# Patient Record
Sex: Female | Born: 1950
Health system: Southern US, Community
[De-identification: ages and names within clinical notes are randomized; demographics above are authoritative.]

## PROBLEM LIST (undated history)

## (undated) DIAGNOSIS — K219 Gastro-esophageal reflux disease without esophagitis: Principal | ICD-10-CM

## (undated) DIAGNOSIS — J302 Other seasonal allergic rhinitis: Secondary | ICD-10-CM

## (undated) DIAGNOSIS — F329 Major depressive disorder, single episode, unspecified: Secondary | ICD-10-CM

## (undated) DIAGNOSIS — E785 Hyperlipidemia, unspecified: Secondary | ICD-10-CM

## (undated) DIAGNOSIS — K5790 Diverticulosis of intestine, part unspecified, without perforation or abscess without bleeding: Secondary | ICD-10-CM

## (undated) DIAGNOSIS — N8110 Cystocele, unspecified: Secondary | ICD-10-CM

## (undated) DIAGNOSIS — G4700 Insomnia, unspecified: Secondary | ICD-10-CM

## (undated) DIAGNOSIS — R002 Palpitations: Secondary | ICD-10-CM

## (undated) DIAGNOSIS — Z872 Personal history of diseases of the skin and subcutaneous tissue: Secondary | ICD-10-CM

## (undated) DIAGNOSIS — R7301 Impaired fasting glucose: Secondary | ICD-10-CM

## (undated) DIAGNOSIS — E038 Other specified hypothyroidism: Secondary | ICD-10-CM

## (undated) DIAGNOSIS — Z8619 Personal history of other infectious and parasitic diseases: Secondary | ICD-10-CM

## (undated) DIAGNOSIS — E663 Overweight: Secondary | ICD-10-CM

## (undated) DIAGNOSIS — Z86010 Personal history of colonic polyps: Secondary | ICD-10-CM

## (undated) DIAGNOSIS — F419 Anxiety disorder, unspecified: Secondary | ICD-10-CM

## (undated) DIAGNOSIS — H269 Unspecified cataract: Secondary | ICD-10-CM

## (undated) DIAGNOSIS — E039 Hypothyroidism, unspecified: Secondary | ICD-10-CM

## (undated) DIAGNOSIS — H8110 Benign paroxysmal vertigo, unspecified ear: Secondary | ICD-10-CM

## (undated) HISTORY — DX: Benign paroxysmal vertigo, unspecified ear: H81.10

## (undated) HISTORY — DX: Hyperlipidemia, unspecified: E78.5

## (undated) HISTORY — DX: Unspecified cataract: H26.9

## (undated) HISTORY — DX: Major depressive disorder, single episode, unspecified: F32.9

## (undated) HISTORY — DX: Cystocele, unspecified: N81.10

## (undated) HISTORY — DX: Gastro-esophageal reflux disease without esophagitis: K21.9

## (undated) HISTORY — DX: Impaired fasting glucose: R73.01

## (undated) HISTORY — PX: COLONOSCOPY: SHX174

## (undated) HISTORY — PX: OTHER SURGICAL HISTORY: SHX169

## (undated) HISTORY — DX: Anxiety disorder, unspecified: F41.9

## (undated) HISTORY — DX: Personal history of diseases of the skin and subcutaneous tissue: Z87.2

## (undated) HISTORY — PX: ESOPHAGOGASTRODUODENOSCOPY: SHX1529

## (undated) HISTORY — DX: Other seasonal allergic rhinitis: J30.2

## (undated) HISTORY — DX: Diverticulosis of intestine, part unspecified, without perforation or abscess without bleeding: K57.90

## (undated) HISTORY — DX: Palpitations: R00.2

## (undated) HISTORY — DX: Other specified hypothyroidism: E03.8

## (undated) HISTORY — DX: Personal history of colonic polyps: Z86.010

## (undated) HISTORY — DX: Overweight: E66.3

## (undated) HISTORY — PX: COSMETIC SURGERY: SHX468

## (undated) HISTORY — DX: Hypothyroidism, unspecified: E03.9

## (undated) HISTORY — DX: Personal history of other infectious and parasitic diseases: Z86.19

## (undated) HISTORY — DX: Insomnia, unspecified: G47.00

---

## 1980-12-22 HISTORY — PX: TONSILLECTOMY: SHX5217

## 1981-12-22 HISTORY — PX: TUBAL LIGATION: SHX77

## 2000-06-18 ENCOUNTER — Encounter: Payer: Self-pay | Admitting: *Deleted

## 2000-06-18 ENCOUNTER — Encounter: Admission: RE | Admit: 2000-06-18 | Discharge: 2000-06-18 | Payer: Self-pay | Admitting: *Deleted

## 2000-06-22 ENCOUNTER — Other Ambulatory Visit: Admission: RE | Admit: 2000-06-22 | Discharge: 2000-06-22 | Payer: Self-pay | Admitting: Obstetrics and Gynecology

## 2001-07-01 ENCOUNTER — Encounter: Admission: RE | Admit: 2001-07-01 | Discharge: 2001-07-01 | Payer: Self-pay | Admitting: *Deleted

## 2001-07-01 ENCOUNTER — Encounter: Payer: Self-pay | Admitting: *Deleted

## 2001-07-27 ENCOUNTER — Other Ambulatory Visit: Admission: RE | Admit: 2001-07-27 | Discharge: 2001-07-27 | Payer: Self-pay | Admitting: Obstetrics and Gynecology

## 2002-07-05 ENCOUNTER — Encounter: Payer: Self-pay | Admitting: *Deleted

## 2002-07-05 ENCOUNTER — Encounter: Admission: RE | Admit: 2002-07-05 | Discharge: 2002-07-05 | Payer: Self-pay | Admitting: *Deleted

## 2003-08-07 ENCOUNTER — Encounter: Payer: Self-pay | Admitting: Surgery

## 2003-08-07 ENCOUNTER — Encounter: Admission: RE | Admit: 2003-08-07 | Discharge: 2003-08-07 | Payer: Self-pay | Admitting: Surgery

## 2004-12-09 ENCOUNTER — Encounter: Admission: RE | Admit: 2004-12-09 | Discharge: 2004-12-09 | Payer: Self-pay | Admitting: General Surgery

## 2005-04-08 ENCOUNTER — Ambulatory Visit: Payer: Self-pay | Admitting: Family Medicine

## 2005-05-16 ENCOUNTER — Ambulatory Visit: Payer: Self-pay | Admitting: Family Medicine

## 2005-09-03 ENCOUNTER — Ambulatory Visit: Payer: Self-pay | Admitting: Family Medicine

## 2005-12-22 DIAGNOSIS — Z8619 Personal history of other infectious and parasitic diseases: Secondary | ICD-10-CM

## 2005-12-22 HISTORY — DX: Personal history of other infectious and parasitic diseases: Z86.19

## 2005-12-31 ENCOUNTER — Ambulatory Visit: Payer: Self-pay | Admitting: Family Medicine

## 2006-03-06 ENCOUNTER — Encounter: Admission: RE | Admit: 2006-03-06 | Discharge: 2006-03-06 | Payer: Self-pay | Admitting: Surgery

## 2006-06-30 ENCOUNTER — Ambulatory Visit: Payer: Self-pay | Admitting: Family Medicine

## 2006-07-22 ENCOUNTER — Ambulatory Visit: Payer: Self-pay | Admitting: Gastroenterology

## 2006-07-28 ENCOUNTER — Encounter (INDEPENDENT_AMBULATORY_CARE_PROVIDER_SITE_OTHER): Payer: Self-pay | Admitting: *Deleted

## 2006-07-28 ENCOUNTER — Ambulatory Visit (HOSPITAL_COMMUNITY): Admission: RE | Admit: 2006-07-28 | Discharge: 2006-07-28 | Payer: Self-pay | Admitting: Gastroenterology

## 2006-07-31 ENCOUNTER — Ambulatory Visit: Payer: Self-pay | Admitting: Gastroenterology

## 2006-08-26 ENCOUNTER — Ambulatory Visit: Payer: Self-pay | Admitting: Gastroenterology

## 2006-11-03 ENCOUNTER — Ambulatory Visit: Payer: Self-pay | Admitting: Family Medicine

## 2007-07-15 ENCOUNTER — Encounter: Admission: RE | Admit: 2007-07-15 | Discharge: 2007-07-15 | Payer: Self-pay | Admitting: Obstetrics and Gynecology

## 2008-07-26 ENCOUNTER — Encounter: Admission: RE | Admit: 2008-07-26 | Discharge: 2008-07-26 | Payer: Self-pay | Admitting: Obstetrics and Gynecology

## 2010-03-28 ENCOUNTER — Encounter: Admission: RE | Admit: 2010-03-28 | Discharge: 2010-03-28 | Payer: Self-pay | Admitting: Obstetrics and Gynecology

## 2010-12-22 HISTORY — PX: POSTERIOR REPAIR: SHX2254

## 2011-03-20 ENCOUNTER — Other Ambulatory Visit: Payer: Self-pay | Admitting: Obstetrics and Gynecology

## 2011-03-20 DIAGNOSIS — Z1231 Encounter for screening mammogram for malignant neoplasm of breast: Secondary | ICD-10-CM

## 2011-04-17 ENCOUNTER — Ambulatory Visit
Admission: RE | Admit: 2011-04-17 | Discharge: 2011-04-17 | Disposition: A | Discharge status: Home / Self Care | Payer: PRIVATE HEALTH INSURANCE | Source: Ambulatory Visit | Attending: Obstetrics and Gynecology | Admitting: Obstetrics and Gynecology

## 2011-04-17 DIAGNOSIS — Z1231 Encounter for screening mammogram for malignant neoplasm of breast: Secondary | ICD-10-CM

## 2011-05-09 NOTE — Assessment & Plan Note (Signed)
[facility]                           GASTROENTEROLOGY OFFICE NOTE   NAME:Barbara Munoz, Barbara Munoz                       MRN:          [id number]  DATE:07/22/2006                            DOB:          29-Mar-1951    Patient is referred by Dr. Joette Catching.   REASON FOR REFERRAL:  Dr. Lysbeth Galas asked me to evaluate Barbara Munoz in  consultation regarding acute diarrhea, abdominal pains.   HISTORY OF PRESENT ILLNESS:  Barbara Munoz is a very pleasant 60 year old who  has had watery, non-bloody diarrhea for the past four weeks.  She has never  had troubles like this before.  She says that the symptoms started July 5.  At its worst she would move her bowels 10-15 times a day, sometimes at  night.  They were mucusy and watery.  She has had nocturnal symptoms.  She  has had no sick contacts, no travel.  She did have abdominal discomfort.  They were almost always relieved when she would move her bowels.  She has  had some mild abdominal discomforts and these seem to be localizing in the  right lower quadrant on more of a chronic basis in the past week or so.  She  has had fevers up to 101 two to three times in the past month.  She was  evaluated by her primary care physician with a urinalysis and this she tells  me was negative.  She was given 10 days of Flagyl and her symptoms did seem  to improve towards the end of that regimen but then when she stopped her  diarrhea returned.  For the past week or so she has definitely noticed an  improvement, moving her bowels only 4-5 times a day.  Along this same time  she has noticed gradually worsening right lower quadrant discomfort.  This  discomfort has not been limiting her.  She has tried Lomotil without much  effect.   REVIEW OF SYSTEMS:  Normal for an 8 pound weight loss in the past month.  No  rashes, lesions on her skin.  No eye symptoms. The rest of her review of  systems is essentially negative and is available  on her nursing in take  sheet.   PAST MEDICAL HISTORY:  1.  Elevated cholesterol.  2.  Anxiety.  3.  Panic disorders since 1985.  4.  Breast surgery - several cysts were aspirated.  5.  Chronic headaches.  6.  Tubal ligation in 1982.   CURRENT MEDICATIONS:  1.  Lipitor.  2.  Sertraline.  3.  Aspirin.  4.  Dicyclomine.   ALLERGIES:  No known drug allergies.   SOCIAL HISTORY:  Married with two daughters.  Works as a Runner, broadcasting/film/video.  Non-  smoker, non-drinker.   FAMILY HISTORY:  Daughter with diabetes.  Sister with breast cancer.  Father  with prostate cancer.  No colon cancer or colon polyps in family.   PHYSICAL EXAMINATION:  VITAL SIGNS:  5 feet 3 inches.  Weight 132 pounds.  Blood pressure 110/60.  Pulse 60.  CONSTITUTIONAL:  Generally well-appearing neurologically alert and  oriented  x3.  HEENT:  Eyes, extraocular movements intact.  Mouth, oropharynx moist.  No  lesions.  NECK:  Supple.  No lymphadenopathy.  CARDIOVASCULAR:  Regular rate and rhythm.  LUNGS:  Clear to auscultation bilaterally.  ABDOMEN:  Soft, mildly tender in the right lower quadrant.  Non-distended.  No peritoneal signs.  No obvious ascites.  EXTREMITIES:  No lower extremity edema.  SKIN:  No rash or lesion on visible extremities.   ASSESSMENT/PLAN:  A 60 year old woman with four weeks of watery, non-bloody  diarrhea, right lower quadrant discomfort.   This seems a bit long for an acute infection, although possibly Giardia  could cause something like this.  That should have been well treated with  Flagyl.  Perhaps it has seeded but she has not been on antibiotics other  than the Flagyl for the past 6-8 months.  I am also concerned that this may  be a new diagnosis of inflammatory bowel disease, specifically Crohn's  Disease.  She does have some discomfort in her right lower quadrant which  may indicate terminal ileal involvement.  I will arrange for her to have  blood tests done today for CBC, complete  metabolic profile, PDG for sprue  and thyroid studies.  She will also have stool testing done for C. diff, ova  and parasites, Giardia, routine stool cultures and stool white count.  Lastly, I will arrange for her to have a colonoscopy done at her soonest  convenience.  I will pay particular attention to her terminal ileum at that  point.                                   Rachael Fee, MD   DPJ/MedQ  DD:  07/22/2006  DT:  07/22/2006  Job #:  [id number]   cc:   Delaney Meigs, MD

## 2011-05-09 NOTE — Assessment & Plan Note (Signed)
[facility]                           GASTROENTEROLOGY OFFICE NOTE   NAME:Banos, Barbara Munoz                       MRN:          [id number]  DATE:08/26/2006                            DOB:          03/24/1951    PRIMARY CARE PHYSICIAN:  Barbara Munoz, M.D.   GI PROBLEM LIST:  Presumed C. difficile colitis.  Began July 2007 shortly  after clindamycin treatment for dental work in June.  C. difficile toxin  negative.  Colonoscopy August 2007 essentially normal except for mild  mucosal abnormality distally.  Biopsies showed minimally active acute  colitis.  The patient responded to 10-day course of Flagyl 250 mg p.o.  t.i.d.  Initially going 10-15 times a day plus nocturnal symptoms.  This  improved dramatically.   INTERVAL HISTORY:  I last saw Barbara Munoz at the time of her colonoscopy  approximately 4 weeks ago.  Since then, she has completed a 10-day course of  Flagyl and has dramatically improved.  She does still have mild fatigue and  loose stools up to 2-3 times a day.  Sometimes she does not move her bowels  at all.   CURRENT MEDICINES:  Lipitor, sertraline, aspirin, dicyclomine.   PHYSICAL EXAM:  134 pounds, blood pressure 108/70, pulse 60.  CONSTITUTIONAL:  Generally well-appearing.  NEUROLOGIC:  Alert and oriented x3.  ABDOMEN:  Soft, nontender, nondistended.  Normal bowel sounds.   ASSESSMENT AND PLAN:  A 60 year old woman with presumed resolved Clostridium  difficile colitis.   I think her current fatigue and mildly loose stools are post-infectious in  nature.  I think we will simply observe these for now.  She knows to call if  she has any return of more dramatic diarrhea.  At that point, I would  restart her on a 14-day course of Flagyl probably at a high dose and see how  she does.                                   Barbara Fee, MD   DPJ/MedQ  DD:  08/26/2006  DT:  08/26/2006  Job #:  [id number]   cc:   Barbara Munoz,  M.D.

## 2011-05-23 HISTORY — PX: OTHER SURGICAL HISTORY: SHX169

## 2011-05-26 ENCOUNTER — Ambulatory Visit (HOSPITAL_COMMUNITY)
Admission: AD | Admit: 2011-05-26 | Discharge: 2011-05-27 | Disposition: A | Discharge status: Home / Self Care | Payer: BC Managed Care – PPO | Source: Ambulatory Visit | Attending: Obstetrics and Gynecology | Admitting: Obstetrics and Gynecology

## 2011-05-26 DIAGNOSIS — N816 Rectocele: Secondary | ICD-10-CM | POA: Insufficient documentation

## 2011-05-26 LAB — CBC
Hemoglobin: 14.1 g/dL (ref 12.0–15.0)
MCHC: 33.8 g/dL (ref 30.0–36.0)
WBC: 7.3 10*3/uL (ref 4.0–10.5)

## 2011-05-27 LAB — CBC
Hemoglobin: 12.1 g/dL (ref 12.0–15.0)
MCV: 93.6 fL (ref 78.0–100.0)
Platelets: 179 10*3/uL (ref 150–400)
RBC: 3.92 MIL/uL (ref 3.87–5.11)
WBC: 12.3 10*3/uL — ABNORMAL HIGH (ref 4.0–10.5)

## 2011-05-29 NOTE — H&P (Signed)
Munoz, Barbara                ACCOUNT NO.:  [id number]  MEDICAL RECORD NO.:  [id number]           PATIENT TYPE:  O  LOCATION:  [facility]                           [facility]  PHYSICIAN:  Dois Davenport A. Shanna Strength, M.D. DATE OF BIRTH:  03/25/1951  DATE OF ADMISSION:  05/14/2011 DATE OF DISCHARGE:                             HISTORY & PHYSICAL   HISTORY OF PRESENT ILLNESS:  Barbara Munoz is a 60 year old married white female para 2-0-1-2 presenting for posterior colporrhaphy because of a symptomatic rectocele.  Barbara Munoz was diagnosed as having a rectocele approximately 3 years ago and since that time has required the application of pressure to her perineum in order to have a bowel movement.  She now has decided that her symptoms have become quite disruptive and desires to proceed with surgical correction.  She goes on to say that occasionally retain of gas in her lower bowel areas that will cause a fluttering sensation in her abdomen and vaginal discomfort.  She denies any dysuria, dyspareunia, incomplete bladder emptying or constipation. She does admit however, to occasional urinary urgency and leaking of urine whenever she sneezes. A pelvic ultrasound done May 2012 showed a uterus measuring 5.49 x 3.83 x 2.71 cm with normal-appearing ovaries and no free fluid or pelvic masses identified.  As previously stated, the patient has decided to proceed with surgical repair of her rectocele though medical options were offered to her.  PAST MEDICAL HISTORY/OB HISTORY:  Gravida 3, para 2-0-1-2.  The patient had 2 spontaneous vaginal births: 1978 -5 pounds 8 ounces and  1981-8 pounds 5 ounces.  GYN HISTORY:  Menarche 60 years old.  The patient has been menopausal since 2004. Gives history of a single episode of post-menopausal bleeding   in April 2012 however, the work-up was negative. Denies any history of  sexually transmitted diseases or abnormal Pap smears.  The patient's  last normal  mammogram and Pap smear was April 2012   MEDICAL HISTORY:  Migraines, gastroesophageal reflux disease, depression, and increased cholesterol.  SURGICAL HISTORY:  1979 D&C because of a  miscarriage, 1982 tonsillectomy and adenoidectomy, 1983 bilateral tubal ligation.  The patient denies any problems with anesthesia and denies any history of blood transfusions.  FAMILY HISTORY:  Thyroid disease, lung cancer, breast cancer (sister at age 34 and maternal grandmother in her 52s), diabetes, hypertension, pulmonary embolism (paternal grandmother in her 47s).  HABITS:  The patient rarely consumes alcohol.  Denies any tobacco or illicit drug use.  SOCIAL HISTORY:  The patient is married and she is a retired Runner, broadcasting/film/video.  CURRENT MEDICATIONS: 1. Simvastatin 40 mg daily. 2. Sertraline 100 mg daily. 3. Aspirin 81 mg daily. 4. Multivitamin 1 daily. 5. Calcium with vitamin D twice daily. 6. Pepcid AC as needed.  ALLERGIES:  She denies any known drug allergies and further denies any sensitivities to soy, shellfish, latex, or peanuts.  REVIEW OF SYSTEMS:  The patient does wear glasses.  She does have chronic ear congestion.  Admits to frequent sinus infections, has occasional urinary urgency, occasional stress urinary incontinence symptoms, occasional leg cramps, atypical chest pain (negative cardiac  workup).  She denies any shortness of breath, nausea, vomiting, diarrhea, arthralgias, myalgias, skin rashes, dysphasia, unilateral weakness, numbness, and except as is mentioned in history present illness the patient's review of systems is otherwise negative.  PHYSICAL EXAMINATION:  VITAL SIGNS:  Blood pressure is 110/78, respiration 14, pulse is 72, temperature is 98.3 degrees Fahrenheit orally, weight 159 pounds, height 5 feet 4-1/4  inches, body mass index is 27.1. NECK:  Supple without masses.  There is no thyromegaly or cervical adenopathy. HEART:  Regular rate and rhythm. LUNGS:   Clear. BACK:  No CVA tenderness. ABDOMEN:  No tenderness, guarding, rebound, masses, or organomegaly. EXTREMITIES:  No clubbing, cyanosis, or edema. PELVIC:  EGBUS is mildly atrophic.  Vagina is atrophic with mild pelvic relaxation.  There is a 2/4 rectocele noted.  Cervix is nontender without lesions.  Uterus appears normal size, shape, and consistency. Adnexa:  No tenderness or masses.  Rectovaginal:  No masses or tenderness.  IMPRESSION:  Symptomatic rectocele.  DISPOSITION:  A discussion was held with the patient regarding indications for her procedure along with its risks which include but are not limited to reaction to anesthesia, damage to adjacent organs, infection, and excessive bleeding.  The patient verbalized understanding of these risks and has consented to proceed with posterior colporrhaphy on May 26, 2011 at 9:30 a.m. at [facility].     Elmira J. Lowell Guitar, P.A.-C   ______________________________ Crist Fat Yardley Beltran, M.D.    EJP/MEDQ  D:  05/23/2011  T:  05/24/2011  Job:  [id number]  Electronically Signed by Raylene Everts. on 05/25/2011 11:06:42 PM Electronically Signed by Silverio Lay M.D. on 05/29/2011 11:26:27 PM

## 2011-05-29 NOTE — Op Note (Signed)
  Barbara Munoz, Barbara Munoz                ACCOUNT NO.:  [id number]  MEDICAL RECORD NO.:  [id number]  LOCATION:  9320                          FACILITY:  [facility]  PHYSICIAN:  Crist Fat. Mirelle Biskup, M.D. DATE OF BIRTH:  03/06/1951  DATE OF PROCEDURE:  05/26/2011 DATE OF DISCHARGE:                              OPERATIVE REPORT   PREOPERATIVE DIAGNOSIS:  Symptomatic rectocele.  POSTOPERATIVE DIAGNOSIS:  Symptomatic rectocele.  ANESTHESIA:  Spinal, Dr. Cristela Blue.  PROCEDURE:  Posterior repair with cure of rectocele.  SURGEON:  Crist Fat. Graig Hessling, MD  ASSISTANT:  Elmira J. Lowell Guitar, physician's assistant.  ESTIMATED BLOOD LOSS:  100 mL.  PROCEDURE IN DETAIL:  After being informed of the planned procedure with possible complications including bleeding, infection, injury to other organs, and recurrence of rectocele, informed consent was obtained.  The patient was taken to OR #3 and given spinal anesthesia without complication.  She was placed in the lithotomy position, prepped and draped in a sterile fashion, and her bladder was emptied with a Foley catheter.  After assessing adequate level of anesthesia, we grabbed the posterior fourchette with Allis forceps and then infiltrated the posterior vaginal wall using lidocaine 1% epinephrine 1:200,000 all the way until the tip of the rectocele.  We then could remove a small section of the fourchette with knife, which gave [place] access to the posterior vaginal wall.  This wall was undermined medially with Strully scissors and opened until the tip of the rectocele.  We can now sharply and bluntly move the prerectal fascia away from the vaginal wall until the rectocele was completely corrected.  We then plicate the prerectal fascia in multiple U sutures of 2-0 Vicryl in three different planes. The rectocele was then completely corrected.  Excess vaginal mucosa was excised and we closed the posterior vaginal mucosa with a running lock suture of 3-0 Vicryl.   A 1-inch estradiol cream packed.  Packing was placed in the vagina.  Instrument and sponge count was complete x2.  Estimated blood loss was 100 mL.  The procedure was well tolerated by the patient who was taken to recovery room in a well and stable condition.  There was no specimen.     Crist Fat Etty Isaac, M.D.     SAR/MEDQ  D:  05/26/2011  T:  05/27/2011  Job:  [id number]  Electronically Signed by Silverio Lay M.D. on 05/29/2011 11:26:32 PM

## 2011-09-05 ENCOUNTER — Ambulatory Visit (INDEPENDENT_AMBULATORY_CARE_PROVIDER_SITE_OTHER): Payer: BC Managed Care – PPO | Admitting: Family Medicine

## 2011-09-05 ENCOUNTER — Encounter: Payer: Self-pay | Admitting: Family Medicine

## 2011-09-05 DIAGNOSIS — F32A Depression, unspecified: Secondary | ICD-10-CM

## 2011-09-05 DIAGNOSIS — Z8619 Personal history of other infectious and parasitic diseases: Secondary | ICD-10-CM

## 2011-09-05 DIAGNOSIS — K219 Gastro-esophageal reflux disease without esophagitis: Secondary | ICD-10-CM

## 2011-09-05 DIAGNOSIS — R3915 Urgency of urination: Secondary | ICD-10-CM

## 2011-09-05 DIAGNOSIS — Z0000 Encounter for general adult medical examination without abnormal findings: Secondary | ICD-10-CM

## 2011-09-05 DIAGNOSIS — T7840XA Allergy, unspecified, initial encounter: Secondary | ICD-10-CM

## 2011-09-05 DIAGNOSIS — N8110 Cystocele, unspecified: Secondary | ICD-10-CM

## 2011-09-05 DIAGNOSIS — N8111 Cystocele, midline: Secondary | ICD-10-CM

## 2011-09-05 DIAGNOSIS — E663 Overweight: Secondary | ICD-10-CM | POA: Insufficient documentation

## 2011-09-05 DIAGNOSIS — Z6825 Body mass index (BMI) 25.0-25.9, adult: Secondary | ICD-10-CM

## 2011-09-05 DIAGNOSIS — E0590 Thyrotoxicosis, unspecified without thyrotoxic crisis or storm: Secondary | ICD-10-CM

## 2011-09-05 DIAGNOSIS — F329 Major depressive disorder, single episode, unspecified: Secondary | ICD-10-CM

## 2011-09-05 DIAGNOSIS — Z872 Personal history of diseases of the skin and subcutaneous tissue: Secondary | ICD-10-CM | POA: Insufficient documentation

## 2011-09-05 DIAGNOSIS — F341 Dysthymic disorder: Secondary | ICD-10-CM

## 2011-09-05 DIAGNOSIS — H8110 Benign paroxysmal vertigo, unspecified ear: Secondary | ICD-10-CM

## 2011-09-05 DIAGNOSIS — E785 Hyperlipidemia, unspecified: Secondary | ICD-10-CM | POA: Insufficient documentation

## 2011-09-05 DIAGNOSIS — Z23 Encounter for immunization: Secondary | ICD-10-CM

## 2011-09-05 DIAGNOSIS — G4700 Insomnia, unspecified: Secondary | ICD-10-CM

## 2011-09-05 DIAGNOSIS — G43909 Migraine, unspecified, not intractable, without status migrainosus: Secondary | ICD-10-CM | POA: Insufficient documentation

## 2011-09-05 HISTORY — DX: Insomnia, unspecified: G47.00

## 2011-09-05 HISTORY — DX: Overweight: E66.3

## 2011-09-05 HISTORY — DX: Gastro-esophageal reflux disease without esophagitis: K21.9

## 2011-09-05 LAB — HEPATIC FUNCTION PANEL
AST: 32 U/L (ref 0–37)
Alkaline Phosphatase: 58 U/L (ref 39–117)
Bilirubin, Direct: 0 mg/dL (ref 0.0–0.3)
Total Protein: 7.9 g/dL (ref 6.0–8.3)

## 2011-09-05 LAB — CBC
Platelets: 204 10*3/uL (ref 150–400)
RDW: 12.7 % (ref 11.5–15.5)
WBC: 6.1 10*3/uL (ref 4.0–10.5)

## 2011-09-05 LAB — LIPID PANEL
HDL: 47.2 mg/dL (ref >39.00)
Total CHOL/HDL Ratio: 4
VLDL: 28 mg/dL (ref 0.0–40.0)

## 2011-09-05 LAB — RENAL FUNCTION PANEL
Albumin: 4.6 g/dL (ref 3.5–5.2)
BUN: 14 mg/dL (ref 6–23)
CO2: 26 mEq/L (ref 19–32)
Chloride: 107 mEq/L (ref 96–112)

## 2011-09-05 LAB — TSH: TSH: 0.24 u[IU]/mL — ABNORMAL LOW (ref 0.35–5.50)

## 2011-09-05 MED ORDER — CYCLOBENZAPRINE HCL 5 MG PO TABS: 5.0000 mg | ORAL_TABLET | Freq: Every evening | ORAL | Status: AC | PRN

## 2011-09-05 NOTE — Patient Instructions (Addendum)
Preventative Care for Adults - Female Studies show that half of deaths in the [place] today result from unhealthy lifestyle practices. This includes ignoring preventive care suggestions. Preventive health guidelines for women include the following key practices:  A routine yearly physical is a good way to check with your primary caregiver about your health and preventive screening. It is a chance to share any concerns and updates on your health, and to receive a thorough all-over exam.   If you smoke cigarettes, find out from your caregiver how to quit. It can literally save your life, no matter how long you have been a tobacco user. If you do not use tobacco, never start.   Maintain a healthy diet and normal weight. Increased weight leads to problems with blood pressure and diabetes. Decrease saturated fat in your diet and increase regular exercise. Eat a variety of foods, including fruit, vegetables, animal or vegetable protein (meat, fish, chicken, and eggs, or beans, lentils, and tofu), and grains, such as rice. Get information about proper diet from your caregiver, if needed.   Aerobic exercise helps maintain good heart health. The CDC and the American College of Sports Medicine recommend 30 minutes of moderate-intensity exercise (a brisk walk that increases your heart rate and breathing) on most days of the week. Ongoing high blood pressure should be treated with medicines, if weight loss and exercise are not effective.   Avoid smoking, drinking too much alcohol (more than two drinks per day), and use of street drugs. Do not share needles with anyone. Ask for professional help if you need support or instructions about stopping the use of alcohol, cigarettes, or drugs.   Maintain normal blood lipids and cholesterol, by minimizing your intake of saturated fat. Eat a well rounded diet, with plenty of fruit and vegetables. The National Institutes of Health encourage women to eat 5-9 servings of  fruit and vegetables each day. Your caregiver can give instructions to help you keep your risk of heart disease or stroke low. High blood pressure causes heart disease and increases risk of stroke. Blood pressure should be checked every 1-2 years, from age 20 onward.   Blood tests for high cholesterol, which causes heart and vessel disease, should begin at age 20 and be repeated every 5 years, if test results are normal. (Repeat tests more often if results are high.)   Diabetes screening involves taking a blood sample to check your blood sugar level, after a fasting period. This is done once every 3 years, after age 45, if test results are normal.   Breast cancer screening is essential to preventive care for women. All women age 20 and older should perform a breast self-exam every month. At age 40 and older, women should have their caregiver complete a breast exam each year. Women at ages 40-50 should have a mammogram (x-ray film) of the breasts each year. Your caregiver can discuss when to start your yearly mammograms.   Cervical cancer screening includes taking a Pap smear (sample of cells examined under a microscope) from the cervix (end of the uterus). It also includes testing for HPV (Human Papilloma Virus, which can cause cervical cancer). Screening and a pelvic exam should begin at age 21, or 3 years after a woman becomes sexually active. Screening should occur every year, with a Pap smear but no HPV testing, up to age 30. After age 30, you should have a Pap smear every 3 years with HPV testing, if no HPV was found previously.     Colon cancer can be detected, and often prevented, long before it is life threatening. Most routine colon cancer screening begins at the age of 50. On a yearly basis, your caregiver may provide easy-to-use take-home tests to check for hidden blood in the stool. Use of a small camera at the end of a tube, to directly examine the colon (sigmoidoscopy or colonoscopy), can  detect the earliest forms of colon cancer and can be life saving. Talk to your caregiver about this at age 50, when routine screening begins. (Screening is repeated every 5 years, unless early forms of pre-cancerous polyps or small growths are found.)   Practice safe sex. Use condoms. Condoms are used for birth control and to reduce the spread of sexually transmitted infections (STIs). Unsafe sex is sexual activity without the use of safeguards, such as condoms and avoidance of high-risk acts, to reduce the chances of getting or spreading STIs. STIs include gonorrhea (the clap), chlamydia, syphilis, trichimonas, herpes, HPV (human papilloma virus) and HIV (human immunodeficiency virus), which causes AIDS. Herpes, HIV, and HPV are viral illnesses that have no cure. They can result in disability, cancer, and death.   HPV causes cancer of the cervix, and other infections that can be transmitted from person to person. There is a vaccine for HPV, and females should get immunized between the ages of 11 and 26. It requires a series of 3 shots.   Osteoporosis is a disease in which the bones lose minerals and strength as we age. This can result in serious bone fractures. Risk of osteoporosis can be identified using a bone density scan. Women ages 65 and over should discuss this with their caregivers, as should women after menopause who have other risk factors. Ask your caregiver whether you should be taking a calcium supplement and Vitamin D, to reduce the rate of osteoporosis.   Menopause can be associated with physical symptoms and risks. Hormone replacement therapy is available to decrease these. You should talk to your caregiver about whether starting or continuing to take hormones is right for you.   Use sunscreen with SPF (skin protection factor) of 15 or more. Apply sunscreen liberally and repeatedly throughout the day. Being outside in the sun, when your shadow is shorter than you are, means you are being  exposed to sun at greater intensity. Lighter skinned people are at a greater risk of skin cancer. Wear sunglasses, to protect your eyes from too much damaging sunlight (which can speed up cataract formation).   Once a month, do a whole body skin exam or review, using a mirror to look at your back. Notify your caregiver of changes in moles, especially if there are changes in shapes, colors, irregular border, a size larger than a pencil eraser, or new moles develop.   Keep carbon monoxide and smoke detectors in your home, and functioning, at all times. Change the batteries every 6 months, or use a model that plugs into the wall.   Stay up to date with your tetanus shots and other required immunizations. A booster for tetanus should be given every 10 years. Be sure to get your flu shot every year, since 5%-20% of the [place] population comes down with the flu. The composition of the flu vaccine changes each year, so being vaccinated once is not enough. Get your shot in the fall, before the flu season peaks. The table below lists important vaccines to get. Other vaccines to consider include for Hepatitis A virus (to prevent a form of   infection of the liver, by a virus acquired from food), Varicella Zoster (a virus that causes shingles), and Meninogoccal (against bacteria which cause a form of meningitis).   Brush your teeth twice a day with fluoride toothpaste, and floss once a day. Good oral hygiene prevents tooth decay and gum disease, which can be painful and can cause other health problems. Visit your dentist for a routine oral and dental check up and preventive care every 6-12 months.   The Body Mass Index or BMI is a way of measuring how much of your body is fat. Having a BMI above 27 increases the risk of heart disease, diabetes, hypertension, stroke and other problems related to obesity. Your caregiver can help determine your BMI, and can develop an exercise and dietary program to help you achieve or  maintain this measurement at a healthy level.   Wear seat belts whenever you are in a vehicle, whether as passenger or driver, and even for short drives of a few minutes.   If you bicycle, wear a helmet at all times.  Preventative Care for Adult Women  Preventative Services Ages 87-39 Ages 31-64 Ages 50 and over  Health risk assessment and lifestyle counseling.     Blood pressure check.** Every 1-2 years Every 1-2 years Every 1-2 years  Total cholesterol check including HDL.** Every 5 years beginning at age 90 Every 5 years beginning at age 25, or more often if risk is high Every 5 years through age 67, then optional  Breast self exam. Monthly in all women ages 53 and older Monthly Monthly  Clinical breast exam.** Every 3 years beginning at age 82 Every year Every year  Mammogram.**  Every year beginning at age 60, optional from age 88-49 (discuss with your caregiver). Every year until age 18, then optional  Pap Smear** and HPV Screening. Every year from ages 38 through 46 Every 3 years from ages 80 through 68, if HPV is negative Optional; talk with your caregiver  Flexible sigmoidoscopy** or colonoscopy.**   Every 5 years beginning at age 10 Every 5 years until age 52; then optional  FOBT (fecal occult blood test) of stool.  Every year beginning at age 34 Every year until 5; then optional  Skin self-exam. Monthly Monthly Monthly  Tetanus-diphtheria (Td) immunization. Every 10 years Every 10 years Every 10 years  Influenza immunization.** Every year Every year Every year  HPV immunization. Once between the ages of 53 and 34     Pneumococcal immunization.** Optional Optional Every 5 years  Hepatitis B immunization.** Series of 3 immunizations  (if not done previously, usually given at 0, 1 to 2, and 4 to 6 months)  Check with your caregiver, if vaccination not previously given Check with your caregiver, if vaccination not previously given  ** Family history and personal history of risk and  conditions may change your caregiver's recommendations.  Document Released: 02/03/2002 Document Re-Released: 03/04/2010 [facility] Patient Information 2011 [place], [place].   Start MegRed by [employer] daily  Eat a yogurt or take a probiotic daily

## 2011-09-06 LAB — T4, FREE: Free T4: 1.03 ng/dL (ref 0.80–1.80)

## 2011-09-08 ENCOUNTER — Encounter: Payer: Self-pay | Admitting: Family Medicine

## 2011-09-08 DIAGNOSIS — Z8619 Personal history of other infectious and parasitic diseases: Secondary | ICD-10-CM | POA: Insufficient documentation

## 2011-09-08 DIAGNOSIS — F32A Depression, unspecified: Secondary | ICD-10-CM

## 2011-09-08 DIAGNOSIS — H8110 Benign paroxysmal vertigo, unspecified ear: Secondary | ICD-10-CM

## 2011-09-08 DIAGNOSIS — N8110 Cystocele, unspecified: Secondary | ICD-10-CM | POA: Insufficient documentation

## 2011-09-08 DIAGNOSIS — F419 Anxiety disorder, unspecified: Secondary | ICD-10-CM

## 2011-09-08 DIAGNOSIS — T7840XA Allergy, unspecified, initial encounter: Secondary | ICD-10-CM | POA: Insufficient documentation

## 2011-09-08 HISTORY — DX: Benign paroxysmal vertigo, unspecified ear: H81.10

## 2011-09-08 HISTORY — DX: Depression, unspecified: F32.A

## 2011-09-08 HISTORY — DX: Anxiety disorder, unspecified: F41.9

## 2011-09-08 NOTE — Assessment & Plan Note (Signed)
Tolerating Simvastatin, would have her start Megared ffish oil caps daily and avoid trans fats, continue to monitor

## 2011-09-08 NOTE — Assessment & Plan Note (Signed)
Adequate control on Famotidine, encouraged avoidance of offending foods and report any worsening symptoms

## 2011-09-08 NOTE — Assessment & Plan Note (Signed)
No symptoms recently, encouraged good hydration and uses Sudafed infrequently with good results continue the same as needed

## 2011-09-08 NOTE — Assessment & Plan Note (Signed)
No significant symptoms at present encouraged her to take a regular dose of antihistamine when symptoms develop

## 2011-09-08 NOTE — Assessment & Plan Note (Signed)
Is already on Sertraline 100mg  po daily and Alprazolam prn and feels she is doing well, no changes to this regimen at this time

## 2011-09-08 NOTE — Assessment & Plan Note (Signed)
No recent symptoms, infection was several years ago

## 2011-09-08 NOTE — Assessment & Plan Note (Signed)
Mild urinary urgency and previously told she had a slight prolapse, she is encouraged to try Kegel exercises bid and we will monitor.

## 2011-09-08 NOTE — Assessment & Plan Note (Signed)
May use Benadryl or Alprazolam prn and good sleep hygiene

## 2011-09-08 NOTE — Progress Notes (Signed)
Barbara Munoz [id number] 01/02/1951 09/08/2011      Progress Note New Patient  Subjective  Chief Complaint  Chief Complaint  Patient presents with  . Establish Care    new patient  . Annual Exam    physical    HPI  Is a 60 year old is in today for new patient appointment. She is in need of establishing care. She has been having trouble intermittently with low back pain over the last month. No radicular symptoms or incontinence. No trauma or falls. Heart trouble in the past. Has not tried any meds for this. She has a history of allergies and does note some mild nasal congestion, postnasal drip and popping in her ears occurs at times and has been present intermittently over the last month. She gets occasional episodes of vertigo but has not had any recently. Uses Sudafed with good results. In June of this year she did have a rectocele repaired. They did not do a bladder tachycardia she was noted to have only very slight prolapse and they did not feel she would benefit. She does continue to have some urinary urgency but denies dysuria, fevers, chills, abdominal or back pain. Reports several years back now having some difficult with chest pain he did undergo EKG, stress testing, echocardiogram at that time which she reports was unremarkable. She does seek [facility] for her gynecologic care. She has a long history of panic and anxiety as well as depression. She feels she is well maintained on sertraline and is not having any panic attacks which she thinks were previously responsible for her atypical CP.  Past Medical History  Diagnosis Date  . Chicken pox as a child  . Measles as a child  . Mumps as a child  . Anxiety   . Depression   . Hyperlipidemia   . GERD (gastroesophageal reflux disease) 09/05/2011  . Overweight (BMI 25.0-29.9) 09/05/2011  . Migraine     worst during menopause  . History of cyst of breast     multiple drained during menopause  . Insomnia 09/05/2011  .  History of Clostridium difficile infection   . Allergy     seasonal  . Female bladder prolapse   . Anxiety and depression 09/08/2011  . Vertigo, benign positional 09/08/2011    Past Surgical History  Procedure Date  . Tonsillectomy 82  . Tubal ligation 1983  . Vaginal hernia repair 6-12    rectocele repair  . Cosmetic surgery     Family History  Problem Relation Age of Onset  . Diabetes Mother 61    type 2  . Aneurysm Father     aortic  . Cancer Father     prostate and lung/ smoker  . Cancer Sister 37    breast  . Hyperlipidemia Sister   . Hypertension Sister   . Diabetes Sister     type 2  . Other Sister     degenerative disc disease  . Thyroid disease Sister   . Hyperlipidemia Brother   . Allergies Brother   . Diabetes Daughter     type 1  . Cancer Maternal Grandmother     breast  . Heart attack Maternal Grandfather   . Heart disease Paternal Grandmother     CHF  . Kidney disease Paternal Grandfather   . Stroke Paternal Grandfather   . Hypertension Sister   . Hyperlipidemia Sister   . Diabetes Sister     type 2  . Allergies Daughter  History   Social History  . Marital Status: Married    Spouse Name: N/A    Number of Children: N/A  . Years of Education: N/A   Occupational History  . Not on file.   Social History Main Topics  . Smoking status: Former Smoker    Quit date: 12/22/1968  . Smokeless tobacco: Never Used   Comment: smoked for about 6 months  . Alcohol Use: No     rarely  . Drug Use: No  . Sexually Active: Not on file   Other Topics Concern  . Not on file   Social History Narrative  . No narrative on file    No current outpatient prescriptions on file prior to visit.    No Known Allergies  Review of Systems  Review of Systems  Constitutional: Negative for fever, chills and malaise/fatigue.  HENT: Positive for congestion. Negative for hearing loss and nosebleeds.   Eyes: Negative for discharge.  Respiratory: Negative  for cough, sputum production, shortness of breath and wheezing.   Cardiovascular: Negative for chest pain, palpitations and leg swelling.  Gastrointestinal: Negative for heartburn, nausea, vomiting, abdominal pain, diarrhea, constipation and blood in stool.  Genitourinary: Negative for dysuria, urgency, frequency and hematuria.  Musculoskeletal: Negative for myalgias, back pain and falls.  Skin: Negative for rash.       Has had a skin bx in past with Dr Loma Boston, bx neg for cancer, no skin concerns today  Neurological: Negative for dizziness, tremors, sensory change, focal weakness, loss of consciousness, weakness and headaches.  Endo/Heme/Allergies: Negative for polydipsia. Does not bruise/bleed easily.  Psychiatric/Behavioral: Negative for depression and suicidal ideas. The patient is not nervous/anxious and does not have insomnia.     Objective  BP 125/80  Pulse 78  Temp(Src) 97.7 F (36.5 C) (Oral)  Ht 5' 3.5" (1.613 m)  Wt 157 lb 6.4 oz (71.396 kg)  BMI 27.44 kg/m2  SpO2 97%  Physical Exam  Physical Exam  Constitutional: She is oriented to person, place, and time and well-developed, well-nourished, and in no distress. No distress.  HENT:  Head: Normocephalic and atraumatic.  Right Ear: External ear normal.  Left Ear: External ear normal.  Nose: Nose normal.  Mouth/Throat: Oropharynx is clear and moist. No oropharyngeal exudate.  Eyes: Conjunctivae are normal. Pupils are equal, round, and reactive to light. Right eye exhibits no discharge. Left eye exhibits no discharge. No scleral icterus.  Neck: Normal range of motion. Neck supple. No thyromegaly present.  Cardiovascular: Normal rate, regular rhythm, normal heart sounds and intact distal pulses.   No murmur heard. Pulmonary/Chest: Effort normal and breath sounds normal. No respiratory distress. She has no wheezes. She has no rales.  Abdominal: Soft. Bowel sounds are normal. She exhibits no distension and no mass. There is no  tenderness.  Musculoskeletal: Normal range of motion. She exhibits no edema and no tenderness.  Lymphadenopathy:    She has no cervical adenopathy.  Neurological: She is alert and oriented to person, place, and time. She has normal reflexes. No cranial nerve deficit. Coordination normal.  Skin: Skin is warm and dry. No rash noted. She is not diaphoretic.  Psychiatric: Mood, memory and affect normal.       Assessment & Plan  History of Clostridium difficile infection No recent symptoms, infection was several years ago   Allergic state No significant symptoms at present encouraged her to take a regular dose of antihistamine when symptoms develop  Female bladder prolapse Mild urinary urgency and  previously told she had a slight prolapse, she is encouraged to try Kegel exercises bid and we will monitor.  Anxiety and depression Is already on Sertraline 100mg  po daily and Alprazolam prn and feels she is doing well, no changes to this regimen at this time  GERD (gastroesophageal reflux disease) Adequate control on Famotidine, encouraged avoidance of offending foods and report any worsening symptoms  Hyperlipidemia Tolerating Simvastatin, would have her start Megared ffish oil caps daily and avoid trans fats, continue to monitor  Vertigo, benign positional No symptoms recently, encouraged good hydration and uses Sudafed infrequently with good results continue the same as needed  Insomnia May use Benadryl or Alprazolam prn and good sleep hygiene

## 2011-10-06 ENCOUNTER — Encounter: Payer: Self-pay | Admitting: Family Medicine

## 2011-10-06 ENCOUNTER — Ambulatory Visit (INDEPENDENT_AMBULATORY_CARE_PROVIDER_SITE_OTHER): Payer: BC Managed Care – PPO | Admitting: Family Medicine

## 2011-10-06 DIAGNOSIS — T7840XA Allergy, unspecified, initial encounter: Secondary | ICD-10-CM

## 2011-10-06 DIAGNOSIS — Z23 Encounter for immunization: Secondary | ICD-10-CM

## 2011-10-06 DIAGNOSIS — F341 Dysthymic disorder: Secondary | ICD-10-CM

## 2011-10-06 DIAGNOSIS — K219 Gastro-esophageal reflux disease without esophagitis: Secondary | ICD-10-CM

## 2011-10-06 DIAGNOSIS — E663 Overweight: Secondary | ICD-10-CM

## 2011-10-06 DIAGNOSIS — F3289 Other specified depressive episodes: Secondary | ICD-10-CM

## 2011-10-06 DIAGNOSIS — E785 Hyperlipidemia, unspecified: Secondary | ICD-10-CM

## 2011-10-06 DIAGNOSIS — Z6825 Body mass index (BMI) 25.0-25.9, adult: Secondary | ICD-10-CM

## 2011-10-06 DIAGNOSIS — G4700 Insomnia, unspecified: Secondary | ICD-10-CM

## 2011-10-06 DIAGNOSIS — F32A Depression, unspecified: Secondary | ICD-10-CM

## 2011-10-06 DIAGNOSIS — E079 Disorder of thyroid, unspecified: Secondary | ICD-10-CM

## 2011-10-06 DIAGNOSIS — F329 Major depressive disorder, single episode, unspecified: Secondary | ICD-10-CM

## 2011-10-06 MED ORDER — ZOSTER VACCINE LIVE 19400 UNT/0.65ML ~~LOC~~ SOLR: 0.6500 mL | Freq: Once | SUBCUTANEOUS | Status: DC

## 2011-10-06 MED ORDER — SERTRALINE HCL 100 MG PO TABS: 100.0000 mg | ORAL_TABLET | Freq: Every day | ORAL | Status: DC

## 2011-10-06 MED ORDER — SIMVASTATIN 40 MG PO TABS: 40.0000 mg | ORAL_TABLET | Freq: Every day | ORAL | Status: DC

## 2011-10-06 NOTE — Assessment & Plan Note (Signed)
No significant symptoms at this time. Continue current meds when necessary avoid offending foods and continue with exercise and weight loss regimen.

## 2011-10-06 NOTE — Assessment & Plan Note (Signed)
No flares in symptoms at this time. May continue to use loratadine when necessary.

## 2011-10-06 NOTE — Assessment & Plan Note (Addendum)
Mild elevation in ldl cholesterol, avoid trans fats start a fiber supplement, such as Benefiber. Continue exercise. Given three-month supply a simvastatin with a refill to

## 2011-10-06 NOTE — Assessment & Plan Note (Signed)
Doing well on current dose of sertraline. Requesting a 90 day supply. Prescription given today.

## 2011-10-06 NOTE — Assessment & Plan Note (Signed)
Tried melatonin but did not have a full response. She is allowed up the possibility that she may use alprazolam 1/2-1 tab intermittently each bedtime as needed.

## 2011-10-06 NOTE — Assessment & Plan Note (Signed)
TSH mildly suppressed but free T4 normal, will repeat labs in next couple of months to monitor, patient asymptomatic

## 2011-10-06 NOTE — Patient Instructions (Signed)

## 2011-10-06 NOTE — Assessment & Plan Note (Signed)
Weight is stable despite a good new exercise regimen, encouraged to consider the DASH diet.

## 2011-10-06 NOTE — Progress Notes (Signed)
Barbara Munoz [id number] [birth year] 10/06/2011      Progress Note-Follow Up  Subjective  Chief Complaint  Chief Complaint  Patient presents with  . Follow-up    1 month follow up    HPI  Patient is in today for routine followup. Generally doing well. No recent illness, fevers, chills, chest pain, palpitations, shortness of breath, GI or joint complaints. She did have a short bout of some increased fatigue but that is resolved. She has been exercising almost daily for about the last month and is very pleased. Says her energy level is better and she feels somewhat better. Her heartburn and allergies generally well-controlled on current indications and with dietary changes. She tried an melatonin for insomnia but this was not helpful. She is pleased with her circulation however for her depression and anxiety and overall feels that will control. Is tolerating her simvastatin taking it routinely. Has checked with the pharmacy and would like to take a prescription for her Zostavax with her today she will have that filled in the pharmacy and get in there.  Past Medical History  Diagnosis Date  . Chicken pox as a child  . Measles as a child  . Mumps as a child  . Anxiety   . Depression   . Hyperlipidemia   . GERD (gastroesophageal reflux disease) 09/05/2011  . Overweight (BMI 25.0-29.9) 09/05/2011  . Migraine     worst during menopause  . History of cyst of breast     multiple drained during menopause  . Insomnia 09/05/2011  . History of Clostridium difficile infection   . Allergy     seasonal  . Female bladder prolapse   . Anxiety and depression 09/08/2011  . Vertigo, benign positional 09/08/2011  . Overweight 10/06/2011    Past Surgical History  Procedure Date  . Tonsillectomy 82  . Tubal ligation 1983  . Vaginal hernia repair 6-12    rectocele repair  . Cosmetic surgery     Family History  Problem Relation Age of Onset  . Diabetes Mother 4    type 2  . Aneurysm Father      aortic  . Cancer Father     prostate and lung/ smoker  . Cancer Sister 69    breast  . Hyperlipidemia Sister   . Hypertension Sister   . Diabetes Sister     type 2  . Other Sister     degenerative disc disease  . Thyroid disease Sister   . Hyperlipidemia Brother   . Allergies Brother   . Diabetes Daughter     type 1  . Cancer Maternal Grandmother     breast  . Heart attack Maternal Grandfather   . Heart disease Paternal Grandmother     CHF  . Kidney disease Paternal Grandfather   . Stroke Paternal Grandfather   . Hypertension Sister   . Hyperlipidemia Sister   . Diabetes Sister     type 2  . Allergies Daughter     History   Social History  . Marital Status: Married    Spouse Name: N/A    Number of Children: N/A  . Years of Education: N/A   Occupational History  . Not on file.   Social History Main Topics  . Smoking status: Former Smoker    Quit date: 12/22/1968  . Smokeless tobacco: Never Used   Comment: smoked for about 6 months  . Alcohol Use: No     rarely  . Drug Use: No  .  Sexually Active: Not on file   Other Topics Concern  . Not on file   Social History Narrative  . No narrative on file    Current Outpatient Prescriptions on File Prior to Visit  Medication Sig Dispense Refill  . ALPRAZolam (XANAX) 0.25 MG tablet Take 0.25 mg by mouth at bedtime as needed.        [name] Kitchen aspirin 81 MG tablet Take 81 mg by mouth daily.        . Calcium Carbonate-Vitamin D (CALCIUM 600+D) 600-400 MG-UNIT per tablet Take 1 tablet by mouth daily.        . Cranberry 500 MG CAPS Take 1 capsule by mouth daily.        . cyclobenzaprine (FLEXERIL) 5 MG tablet Take 1 tablet (5 mg total) by mouth at bedtime as needed for muscle spasms.  30 tablet  1  . docusate sodium (COLACE) 100 MG capsule Take 100 mg by mouth daily.        [name] Kitchen loratadine (CLARITIN) 10 MG tablet Take 10 mg by mouth daily as needed. Spring and fall       . Multiple Vitamin (MULTIVITAMIN) tablet Take 1 tablet  by mouth daily.        . famotidine (PEPCID) 20 MG tablet Take 20 mg by mouth daily.          No Known Allergies  Review of Systems  Review of Systems  Constitutional: Positive for malaise/fatigue. Negative for fever.  HENT: Negative for congestion.   Eyes: Negative for discharge.  Respiratory: Negative for shortness of breath.   Cardiovascular: Negative for chest pain, palpitations and leg swelling.  Gastrointestinal: Negative for nausea, abdominal pain and diarrhea.  Genitourinary: Negative for dysuria.  Musculoskeletal: Negative for falls.  Skin: Negative for rash.  Neurological: Negative for loss of consciousness and headaches.  Endo/Heme/Allergies: Negative for polydipsia.  Psychiatric/Behavioral: Negative for depression and suicidal ideas. The patient has insomnia. The patient is not nervous/anxious.     Objective  BP 117/78  Pulse 79  Temp(Src) 97.8 F (36.6 C) (Oral)  Ht 5' 3.5" (1.613 m)  Wt 157 lb 12.8 oz (71.578 kg)  BMI 27.51 kg/m2  SpO2 95%  Physical Exam  Physical Exam  Constitutional: She is oriented to person, place, and time and well-developed, well-nourished, and in no distress. No distress.  HENT:  Head: Normocephalic and atraumatic.  Eyes: Conjunctivae are normal.  Neck: Neck supple. No thyromegaly present.  Cardiovascular: Normal rate and regular rhythm.  Exam reveals gallop.   No murmur heard. Pulmonary/Chest: Effort normal and breath sounds normal. She has no wheezes.  Abdominal: She exhibits no distension and no mass.  Musculoskeletal: She exhibits no edema.  Lymphadenopathy:    She has no cervical adenopathy.  Neurological: She is alert and oriented to person, place, and time.  Skin: Skin is warm and dry. No rash noted. She is not diaphoretic.  Psychiatric: Memory, affect and judgment normal.    Lab Results  Component Value Date   TSH 0.24* 09/05/2011   Lab Results  Component Value Date   WBC 6.1 09/05/2011   HGB 14.2 09/05/2011    HCT 43.6 09/05/2011   MCV 92.2 09/05/2011   PLT 204 09/05/2011   Lab Results  Component Value Date   CREATININE 0.7 09/05/2011   BUN 14 09/05/2011   NA 142 09/05/2011   K 4.1 09/05/2011   CL 107 09/05/2011   CO2 26 09/05/2011   Lab Results  Component Value Date  ALT 29 09/05/2011   AST 32 09/05/2011   ALKPHOS 58 09/05/2011   BILITOT 0.5 09/05/2011   Lab Results  Component Value Date   CHOL 185 09/05/2011   Lab Results  Component Value Date   HDL 47.20 09/05/2011   Lab Results  Component Value Date   LDLCALC 110* 09/05/2011   Lab Results  Component Value Date   TRIG 140.0 09/05/2011   Lab Results  Component Value Date   CHOLHDL 4 09/05/2011     Assessment & Plan  Hyperlipidemia Mild elevation in ldl cholesterol, avoid trans fats start a fiber supplement, such as Benefiber. Continue exercise. Given three-month supply a simvastatin with a refill to  Overweight (BMI 25.0-29.9) Weight is stable despite a good new exercise regimen, encouraged to consider the DASH diet.   Anxiety and depression Doing well on current dose of sertraline. Requesting a 90 day supply. Prescription given today.  Insomnia Tried melatonin but did not have a full response. She is allowed up the possibility that she may use alprazolam 1/2-1 tab intermittently each bedtime as needed.  Allergic state No flares in symptoms at this time. May continue to use loratadine when necessary.  GERD (gastroesophageal reflux disease) No significant symptoms at this time. Continue current meds when necessary avoid offending foods and continue with exercise and weight loss regimen.  Thyroid dysfunction TSH mildly suppressed but free T4 normal, will repeat labs in next couple of months to monitor, patient asymptomatic

## 2011-10-23 ENCOUNTER — Ambulatory Visit (INDEPENDENT_AMBULATORY_CARE_PROVIDER_SITE_OTHER): Payer: BC Managed Care – PPO | Admitting: Family Medicine

## 2011-10-23 ENCOUNTER — Encounter: Payer: Self-pay | Admitting: Family Medicine

## 2011-10-23 DIAGNOSIS — J329 Chronic sinusitis, unspecified: Secondary | ICD-10-CM

## 2011-10-23 DIAGNOSIS — F341 Dysthymic disorder: Secondary | ICD-10-CM

## 2011-10-23 DIAGNOSIS — F419 Anxiety disorder, unspecified: Secondary | ICD-10-CM

## 2011-10-23 DIAGNOSIS — J019 Acute sinusitis, unspecified: Secondary | ICD-10-CM

## 2011-10-23 DIAGNOSIS — E079 Disorder of thyroid, unspecified: Secondary | ICD-10-CM

## 2011-10-23 DIAGNOSIS — G4700 Insomnia, unspecified: Secondary | ICD-10-CM

## 2011-10-23 DIAGNOSIS — F32A Depression, unspecified: Secondary | ICD-10-CM

## 2011-10-23 DIAGNOSIS — H8110 Benign paroxysmal vertigo, unspecified ear: Secondary | ICD-10-CM

## 2011-10-23 LAB — T4, FREE: Free T4: 0.79 ng/dL (ref 0.60–1.60)

## 2011-10-23 LAB — TSH: TSH: 0.91 u[IU]/mL (ref 0.35–5.50)

## 2011-10-23 MED ORDER — AMOXICILLIN-POT CLAVULANATE 875-125 MG PO TABS: 1.0000 | ORAL_TABLET | Freq: Two times a day (BID) | ORAL | Status: DC

## 2011-10-23 MED ORDER — GUAIFENESIN ER 600 MG PO TB12: 600.0000 mg | ORAL_TABLET | Freq: Two times a day (BID) | ORAL | Status: AC

## 2011-10-23 MED ORDER — AMOXICILLIN-POT CLAVULANATE 875-125 MG PO TABS: ORAL_TABLET | ORAL | Status: DC

## 2011-10-23 NOTE — Patient Instructions (Signed)
Sinusitis Sinuses are air pockets within the bones of your face. The growth of bacteria within a sinus leads to infection. The infection prevents the sinuses from draining. This infection is called sinusitis. SYMPTOMS  There will be different areas of pain depending on which sinuses have become infected.  The maxillary sinuses often produce pain beneath the eyes.   Frontal sinusitis may cause pain in the middle of the forehead and above the eyes.  Other problems (symptoms) include:  Toothaches.   Colored, pus-like (purulent) drainage from the nose.   Swelling, warmth, and tenderness over the sinus areas may be signs of infection.  TREATMENT  Sinusitis is most often determined by an exam.X-rays may be taken. If x-rays have been taken, make sure you obtain your results or find out how you are to obtain them. Your caregiver may give you medications (antibiotics). These are medications that will help kill the bacteria causing the infection. You may also be given a medication (decongestant) that helps to reduce sinus swelling.  HOME CARE INSTRUCTIONS   Only take over-the-counter or prescription medicines for pain, discomfort, or fever as directed by your caregiver.   Drink extra fluids. Fluids help thin the mucus so your sinuses can drain more easily.   Applying either moist heat or ice packs to the sinus areas may help relieve discomfort.   Use saline nasal sprays to help moisten your sinuses. The sprays can be found at your local drugstore.  SEEK IMMEDIATE MEDICAL CARE IF:  You have a fever.   You have increasing pain, severe headaches, or toothache.   You have nausea, vomiting, or drowsiness.   You develop unusual swelling around the face or trouble seeing.  MAKE SURE YOU:   Understand these instructions.   Will watch your condition.   Will get help right away if you are not doing well or get worse.  Document Released: 12/08/2005 Document Revised: 08/20/2011 Document Reviewed:  07/07/2007 [facility] Patient Information 2012 [place], [place].   Start a probiotic and a yogurt daily Increase clear liquids Take 1/2 a tab of Alprazolam 1 hour prior to bed for the next 3 days and if no improvement in sleep call next week and consider medications for restless leg syndrome such as Requip or Mirapex

## 2011-10-23 NOTE — Assessment & Plan Note (Signed)
Has had some recent episodes but no symptoms presently, can consider vestibular rehaab if episodes become more frequent or troublesome

## 2011-10-23 NOTE — Assessment & Plan Note (Signed)
Has not been sleeping well recently with acute illness is sleeping at strange times then unable to sleep qhs. Encouraged to take a 1/2 a Alprazolam 1 hour prior to bedtime to help reset her clock for the next 3 days and then to reset her clock. Then as needed. If she still feels restless consider meds for RLS

## 2011-10-23 NOTE — Assessment & Plan Note (Signed)
She has had some increased stressors lately, ie daughter's pregnancy but she does not feel she needs a change in baseline meds at this time

## 2011-10-23 NOTE — Assessment & Plan Note (Signed)
Patient with sinusitis, acute vs a bad tooth/gingivitis. Given a prescription for Augmentin 875 1 tab po bid to take for 28 days, start Mucinex bid and increase fluids, she has an appt with a dentist soon to discuss a bad root canal in that region as well. Call if symptoms do not improve

## 2011-10-23 NOTE — Assessment & Plan Note (Signed)
Depressed tsh with labs in September but normal free T4 and now with some tremulousness and restless symptoms most notably qhs. Will recheck labs

## 2011-10-23 NOTE — Progress Notes (Signed)
Barbara Munoz [id number] 1951/08/12 10/23/2011      Progress Note-Follow Up  Subjective  Chief Complaint  Chief Complaint  Patient presents with  . Fatigue    X 2 weeks  . Adenopathy    swollen glands X 2 weeks  . Otalgia    X 2 weeks off and on  . Insomnia    X 3 days    HPI  Is a 60 year old Caucasian female who is in today with multiple concerns. She reports feeling poorly for the last 2 weeks. She is complaining of increased fatigue, painful lymphadenopathy in the neck, sinus congestion and headaches. She has some gum irritation and is scheduled to see her dentist soon. She is a bad root canal in the right upper gumline which he thinks is flaring. She denies fevers chills. She is having some intermittent discomfort in her ear since her pressure as well. No obvious chest pain, palpitations or shortness of breath. She does have some mild epistaxis which has occurred over the last couple of days. Has some mild throat irritation but no pain. Postnasal drip and some nasal drainage is noted. Does occasionally have a sense of difficulty breathing deeply. No wheezing or significant coughing. She is noting difficulty sleeping. Also feeling overly tired. She's been sleeping. During the day and having troubles at night. She is acknowledging some increased stress but does not think the major issue for her. Did have a flare of vertigo recently but that has calmed down. She is noting a long history of restless leg symptoms at night more on the  right than the left today she is noting just feeling tremulous all over at night when she cuts and having trouble staying asleep here  Past Medical History  Diagnosis Date  . Chicken pox as a child  . Measles as a child  . Mumps as a child  . Anxiety   . Depression   . Hyperlipidemia   . GERD (gastroesophageal reflux disease) 09/05/2011  . Overweight (BMI 25.0-29.9) 09/05/2011  . Migraine     worst during menopause  . History of cyst of breast    multiple drained during menopause  . Insomnia 09/05/2011  . History of Clostridium difficile infection   . Allergy     seasonal  . Female bladder prolapse   . Anxiety and depression 09/08/2011  . Vertigo, benign positional 09/08/2011  . Overweight 10/06/2011    Past Surgical History  Procedure Date  . Tonsillectomy 82  . Tubal ligation 1983  . Vaginal hernia repair 6-12    rectocele repair  . Cosmetic surgery     Family History  Problem Relation Age of Onset  . Diabetes Mother 37    type 2  . Aneurysm Father     aortic  . Cancer Father     prostate and lung/ smoker  . Cancer Sister 69    breast  . Hyperlipidemia Sister   . Hypertension Sister   . Diabetes Sister     type 2  . Other Sister     degenerative disc disease  . Thyroid disease Sister   . Hyperlipidemia Brother   . Allergies Brother   . Diabetes Daughter     type 1  . Cancer Maternal Grandmother     breast  . Heart attack Maternal Grandfather   . Heart disease Paternal Grandmother     CHF  . Kidney disease Paternal Grandfather   . Stroke Paternal Grandfather   . Hypertension Sister   .  Hyperlipidemia Sister   . Diabetes Sister     type 2  . Allergies Daughter     History   Social History  . Marital Status: Married    Spouse Name: N/A    Number of Children: N/A  . Years of Education: N/A   Occupational History  . Not on file.   Social History Main Topics  . Smoking status: Former Smoker    Quit date: 12/22/1968  . Smokeless tobacco: Never Used   Comment: smoked for about 6 months  . Alcohol Use: No     rarely  . Drug Use: No  . Sexually Active: Not on file   Other Topics Concern  . Not on file   Social History Narrative  . No narrative on file    Current Outpatient Prescriptions on File Prior to Visit  Medication Sig Dispense Refill  . ALPRAZolam (XANAX) 0.25 MG tablet Take 0.25 mg by mouth at bedtime as needed.        [name] Kitchen aspirin 81 MG tablet Take 81 mg by mouth daily.          . Calcium Carbonate-Vitamin D (CALCIUM 600+D) 600-400 MG-UNIT per tablet Take 1 tablet by mouth daily.        . Cranberry 500 MG CAPS Take 1 capsule by mouth daily.        . cyclobenzaprine (FLEXERIL) 5 MG tablet Take 1 tablet (5 mg total) by mouth at bedtime as needed for muscle spasms.  30 tablet  1  . docusate sodium (COLACE) 100 MG capsule Take 100 mg by mouth daily.        . famotidine (PEPCID) 20 MG tablet Take 20 mg by mouth daily.        [name] Kitchen loratadine (CLARITIN) 10 MG tablet Take 10 mg by mouth daily as needed. Spring and fall       . Multiple Vitamin (MULTIVITAMIN) tablet Take 1 tablet by mouth daily.        . ranitidine (ZANTAC) 150 MG tablet Take 150 mg by mouth daily.        . sertraline (ZOLOFT) 100 MG tablet Take 1 tablet (100 mg total) by mouth daily.  90 tablet  3  . simvastatin (ZOCOR) 40 MG tablet Take 1 tablet (40 mg total) by mouth at bedtime.  90 tablet  3  . zoster vaccine live, PF, (ZOSTAVAX) 40981 UNT/0.65ML injection Inject 19,400 Units into the skin once.  1 vial  0    No Known Allergies  Review of Systems  Review of Systems  Constitutional: Positive for malaise/fatigue. Negative for fever and chills.  HENT: Positive for ear pain and congestion. Negative for sore throat.   Eyes: Negative for discharge.  Respiratory: Positive for sputum production. Negative for shortness of breath.   Cardiovascular: Negative for chest pain, palpitations and leg swelling.  Gastrointestinal: Negative for nausea, abdominal pain and diarrhea.  Genitourinary: Negative for dysuria.  Musculoskeletal: Negative for falls.  Skin: Negative for rash.  Neurological: Positive for headaches. Negative for loss of consciousness.  Endo/Heme/Allergies: Negative for polydipsia.  Psychiatric/Behavioral: Negative for depression and suicidal ideas. The patient is not nervous/anxious and does not have insomnia.     Objective  BP 120/76  Pulse 87  Temp(Src) 97.7 F (36.5 C) (Oral)  Ht 5' 3.5"  (1.613 m)  Wt 156 lb 1.9 oz (70.816 kg)  BMI 27.22 kg/m2  SpO2 96%  Physical Exam  Physical Exam  Constitutional: She is oriented to person, place, and  time and well-developed, well-nourished, and in no distress. No distress.  HENT:  Head: Normocephalic and atraumatic.       Nasal mucosa erythematous and boggy. TMs dull and mildly retracted, left tm mildly erythematous  Eyes: Conjunctivae are normal.  Neck: Neck supple. No thyromegaly present.  Cardiovascular: Normal rate, regular rhythm and normal heart sounds.   No murmur heard. Pulmonary/Chest: Effort normal and breath sounds normal. She has no wheezes.  Abdominal: She exhibits no distension and no mass.  Musculoskeletal: She exhibits no edema.  Lymphadenopathy:    She has no cervical adenopathy.  Neurological: She is alert and oriented to person, place, and time.  Skin: Skin is warm and dry. No rash noted. She is not diaphoretic.  Psychiatric: Memory, affect and judgment normal.    Lab Results  Component Value Date   TSH 0.24* 09/05/2011   Lab Results  Component Value Date   WBC 6.1 09/05/2011   HGB 14.2 09/05/2011   HCT 43.6 09/05/2011   MCV 92.2 09/05/2011   PLT 204 09/05/2011   Lab Results  Component Value Date   CREATININE 0.7 09/05/2011   BUN 14 09/05/2011   NA 142 09/05/2011   K 4.1 09/05/2011   CL 107 09/05/2011   CO2 26 09/05/2011   Lab Results  Component Value Date   ALT 29 09/05/2011   AST 32 09/05/2011   ALKPHOS 58 09/05/2011   BILITOT 0.5 09/05/2011   Lab Results  Component Value Date   CHOL 185 09/05/2011   Lab Results  Component Value Date   HDL 47.20 09/05/2011   Lab Results  Component Value Date   LDLCALC 110* 09/05/2011   Lab Results  Component Value Date   TRIG 140.0 09/05/2011   Lab Results  Component Value Date   CHOLHDL 4 09/05/2011     Assessment & Plan  Sinusitis acute Patient with sinusitis, acute vs a bad tooth/gingivitis. Given a prescription for Augmentin 875 1 tab po bid to  take for 28 days, start Mucinex bid and increase fluids, she has an appt with a dentist soon to discuss a bad root canal in that region as well. Call if symptoms do not improve  Thyroid dysfunction Depressed tsh with labs in September but normal free T4 and now with some tremulousness and restless symptoms most notably qhs. Will recheck labs  Insomnia Has not been sleeping well recently with acute illness is sleeping at strange times then unable to sleep qhs. Encouraged to take a 1/2 a Alprazolam 1 hour prior to bedtime to help reset her clock for the next 3 days and then to reset her clock. Then as needed. If she still feels restless consider meds for RLS  Vertigo, benign positional Has had some recent episodes but no symptoms presently, can consider vestibular rehaab if episodes become more frequent or troublesome  Anxiety and depression She has had some increased stressors lately, ie daughter's pregnancy but she does not feel she needs a change in baseline meds at this time

## 2011-12-24 ENCOUNTER — Ambulatory Visit (INDEPENDENT_AMBULATORY_CARE_PROVIDER_SITE_OTHER): Payer: BC Managed Care – PPO | Admitting: Family Medicine

## 2011-12-24 ENCOUNTER — Encounter: Payer: Self-pay | Admitting: Family Medicine

## 2011-12-24 VITALS — BP 106/73 | HR 95 | Temp 98.4°F | Ht 63.5 in | Wt 153.8 lb

## 2011-12-24 DIAGNOSIS — J209 Acute bronchitis, unspecified: Secondary | ICD-10-CM

## 2011-12-24 DIAGNOSIS — J029 Acute pharyngitis, unspecified: Secondary | ICD-10-CM

## 2011-12-24 MED ORDER — HYDROCOD POLST-CHLORPHEN POLST 10-8 MG/5ML PO LQCR: 5.0000 mL | Freq: Every evening | ORAL | Status: DC | PRN

## 2011-12-24 MED ORDER — AZITHROMYCIN 250 MG PO TABS: ORAL_TABLET | ORAL | Status: AC

## 2011-12-24 NOTE — Progress Notes (Signed)
Patient ID: Barbara Munoz, female   DOB: [birth year], 61 y.o.   MRN: [id number] JANERA PEUGH [id number] 1951-08-05 12/24/2011      Progress Note-Follow Up  Subjective  Chief Complaint  Chief Complaint  Patient presents with  . Sore Throat    X 4 days    HPI  Is a 61 year old Caucasian female who is in today with a four-day history of worsening sore throat, cough, nasal and chest congestion. She continues to have somewhat helpful but her cough is productive but she clearly O. sputum. She is coughing severely enough to cause gagging and was vomiting at times does have some nausea but denies diarrhea, abdominal pain, chest pain, palpitations, shortness of breath. Has had some recent right ear pain and pressure left ear is spared she has been using Tylenol every 4-6 hours to keep down a fever but denies chills. No anorexia  Past Medical History  Diagnosis Date  . Chicken pox as a child  . Measles as a child  . Mumps as a child  . Anxiety   . Depression   . Hyperlipidemia   . GERD (gastroesophageal reflux disease) 09/05/2011  . Overweight (BMI 25.0-29.9) 09/05/2011  . Migraine     worst during menopause  . History of cyst of breast     multiple drained during menopause  . Insomnia 09/05/2011  . History of Clostridium difficile infection   . Allergy     seasonal  . Female bladder prolapse   . Anxiety and depression 09/08/2011  . Vertigo, benign positional 09/08/2011  . Overweight 10/06/2011  . Pharyngitis 12/24/2011    Past Surgical History  Procedure Date  . Tonsillectomy 61  . Tubal ligation 1983  . Vaginal hernia repair 6-12    rectocele repair  . Cosmetic surgery     Family History  Problem Relation Age of Onset  . Diabetes Mother 79    type 2  . Aneurysm Father     aortic  . Cancer Father     prostate and lung/ smoker  . Cancer Sister 63    breast  . Hyperlipidemia Sister   . Hypertension Sister   . Diabetes Sister     type 2  . Other Sister    degenerative disc disease  . Thyroid disease Sister   . Hyperlipidemia Brother   . Allergies Brother   . Diabetes Daughter     type 1  . Cancer Maternal Grandmother     breast  . Heart attack Maternal Grandfather   . Heart disease Paternal Grandmother     CHF  . Kidney disease Paternal Grandfather   . Stroke Paternal Grandfather   . Hypertension Sister   . Hyperlipidemia Sister   . Diabetes Sister     type 2  . Allergies Daughter     History   Social History  . Marital Status: Married    Spouse Name: N/A    Number of Children: N/A  . Years of Education: N/A   Occupational History  . Not on file.   Social History Main Topics  . Smoking status: Former Smoker    Quit date: 12/22/1968  . Smokeless tobacco: Never Used   Comment: smoked for about 6 months  . Alcohol Use: No     rarely  . Drug Use: No  . Sexually Active: Not on file   Other Topics Concern  . Not on file   Social History Narrative  . No narrative on file  Current Outpatient Prescriptions on File Prior to Visit  Medication Sig Dispense Refill  . ALPRAZolam (XANAX) 0.25 MG tablet Take 0.25 mg by mouth at bedtime as needed.        [name] Kitchen aspirin 81 MG tablet Take 81 mg by mouth daily.        . Calcium Carbonate-Vitamin D (CALCIUM 600+D) 600-400 MG-UNIT per tablet Take 1 tablet by mouth daily.        [name] Kitchen guaiFENesin (MUCINEX) 600 MG 12 hr tablet Take 1 tablet (600 mg total) by mouth 2 (two) times daily.  60 tablet  2  . Multiple Vitamin (MULTIVITAMIN) tablet Take 1 tablet by mouth daily.        . ranitidine (ZANTAC) 150 MG tablet Take 150 mg by mouth daily.        . sertraline (ZOLOFT) 100 MG tablet Take 1 tablet (100 mg total) by mouth daily.  90 tablet  3  . simvastatin (ZOCOR) 40 MG tablet Take 1 tablet (40 mg total) by mouth at bedtime.  90 tablet  3  . Cranberry 500 MG CAPS Take 1 capsule by mouth daily.        . cyclobenzaprine (FLEXERIL) 5 MG tablet Take 1 tablet (5 mg total) by mouth at bedtime as  needed for muscle spasms.  30 tablet  1  . docusate sodium (COLACE) 100 MG capsule Take 100 mg by mouth daily.        [name] Kitchen loratadine (CLARITIN) 10 MG tablet Take 10 mg by mouth daily as needed. Spring and fall         No Known Allergies  Review of Systems  Review of Systems  Constitutional: Positive for fever. Negative for chills and malaise/fatigue.  HENT: Positive for ear pain, congestion and sore throat.   Eyes: Negative for discharge.  Respiratory: Positive for cough and sputum production. Negative for shortness of breath.   Cardiovascular: Negative for chest pain, palpitations and leg swelling.  Gastrointestinal: Positive for nausea. Negative for vomiting, abdominal pain, diarrhea, constipation, blood in stool and melena.  Genitourinary: Negative for dysuria.  Musculoskeletal: Negative for falls.  Skin: Negative for rash.  Neurological: Negative for loss of consciousness and headaches.  Endo/Heme/Allergies: Negative for polydipsia.  Psychiatric/Behavioral: Negative for depression and suicidal ideas. The patient is not nervous/anxious and does not have insomnia.     Objective  BP 106/73  Pulse 95  Temp(Src) 98.4 F (36.9 C) (Temporal)  Ht 5' 3.5" (1.613 m)  Wt 153 lb 12.8 oz (69.763 kg)  BMI 26.82 kg/m2  SpO2 95%  Physical Exam  Physical Exam  Constitutional: She is oriented to person, place, and time and well-developed, well-nourished, and in no distress. No distress.  HENT:  Head: Normocephalic and atraumatic.       Posterior oropharynx erythematous  Eyes: Conjunctivae are normal.  Neck: Neck supple. No thyromegaly present.  Cardiovascular: Normal rate, regular rhythm and normal heart sounds.   No murmur heard. Pulmonary/Chest: Effort normal and breath sounds normal. She has no wheezes.  Abdominal: She exhibits no distension and no mass.  Musculoskeletal: She exhibits no edema.  Lymphadenopathy:    She has cervical adenopathy.  Neurological: She is alert and  oriented to person, place, and time.  Skin: Skin is warm and dry. No rash noted. She is not diaphoretic.  Psychiatric: Memory, affect and judgment normal.    Lab Results  Component Value Date   TSH 0.91 10/23/2011   Lab Results  Component Value Date   WBC  6.1 09/05/2011   HGB 14.2 09/05/2011   HCT 43.6 09/05/2011   MCV 92.2 09/05/2011   PLT 204 09/05/2011   Lab Results  Component Value Date   CREATININE 0.7 09/05/2011   BUN 14 09/05/2011   NA 142 09/05/2011   K 4.1 09/05/2011   CL 107 09/05/2011   CO2 26 09/05/2011   Lab Results  Component Value Date   ALT 29 09/05/2011   AST 32 09/05/2011   ALKPHOS 58 09/05/2011   BILITOT 0.5 09/05/2011   Lab Results  Component Value Date   CHOL 185 09/05/2011   Lab Results  Component Value Date   HDL 47.20 09/05/2011   Lab Results  Component Value Date   LDLCALC 110* 09/05/2011   Lab Results  Component Value Date   TRIG 140.0 09/05/2011   Lab Results  Component Value Date   CHOLHDL 4 09/05/2011     Assessment & Plan  Pharyngitis Started on Azithromycin and Mucinex, increase fluids and rest and report if symptoms do not improve.

## 2011-12-24 NOTE — Patient Instructions (Signed)

## 2011-12-24 NOTE — Assessment & Plan Note (Signed)
Started on Azithromycin and Mucinex, increase fluids and rest and report if symptoms do not improve.

## 2012-02-03 ENCOUNTER — Ambulatory Visit (INDEPENDENT_AMBULATORY_CARE_PROVIDER_SITE_OTHER): Payer: BC Managed Care – PPO | Admitting: Family Medicine

## 2012-02-03 ENCOUNTER — Encounter: Payer: Self-pay | Admitting: Family Medicine

## 2012-02-03 VITALS — BP 116/78 | HR 84 | Temp 98.9°F | Ht 63.5 in | Wt 153.8 lb

## 2012-02-03 DIAGNOSIS — J029 Acute pharyngitis, unspecified: Secondary | ICD-10-CM

## 2012-02-03 DIAGNOSIS — J209 Acute bronchitis, unspecified: Secondary | ICD-10-CM

## 2012-02-03 DIAGNOSIS — J40 Bronchitis, not specified as acute or chronic: Secondary | ICD-10-CM

## 2012-02-03 MED ORDER — CEFDINIR 300 MG PO CAPS: 300.0000 mg | ORAL_CAPSULE | Freq: Two times a day (BID) | ORAL | Status: AC

## 2012-02-03 NOTE — Assessment & Plan Note (Signed)
Cefdinir and Mucinex bid, push clear fluids and increase rest, has not been sleeping well and coughing a lot at night is encouraged to take the cough suppressant she was already given.

## 2012-02-03 NOTE — Progress Notes (Signed)
Patient ID: Barbara Munoz, female   DOB: June 02, 1951, 61 y.o.   MRN: [id number] Barbara Munoz [id number] 26-Jun-1951 02/03/2012      Progress Note-Follow Up  Subjective  Chief Complaint  Chief Complaint  Patient presents with  . Bronchitis    ears hurt X 1 week, cough w/ phlegm (yellow), laringytis X 3 days    HPI  This is a [age]-year-old Caucasian female who is limited in any one-week history of respiratory symptoms. She started noting some ear pressure and discomfort about a week ago since then she developed a cough productive of yellow phlegm at times. A sense of chest congestion and tightness and some laryngitis with irritated scratchy and burning throat for the last 3 days. She said some intermittent postnasal drip occasionally productive of some yellow phlegm and some mild nasal congestion as well. She denies any chest pain or palpitations. She denies any shortness of breath or just her old introitus symptoms such as diarrhea or nausea. She denies fevers or chills or any active rhinorrhea. She has been using Mucinex for about a week and uses Tylenol intermittently with minimal relief.  Past Medical History  Diagnosis Date  . Chicken pox as a child  . Measles as a child  . Mumps as a child  . Anxiety   . Depression   . Hyperlipidemia   . GERD (gastroesophageal reflux disease) 09/05/2011  . Overweight (BMI 25.0-29.9) 09/05/2011  . Migraine     worst during menopause  . History of cyst of breast     multiple drained during menopause  . Insomnia 09/05/2011  . History of Clostridium difficile infection   . Allergy     seasonal  . Female bladder prolapse   . Anxiety and depression 09/08/2011  . Vertigo, benign positional 09/08/2011  . Overweight 10/06/2011  . Pharyngitis 12/24/2011    Past Surgical History  Procedure Date  . Tonsillectomy 82  . Tubal ligation 1983  . Vaginal hernia repair 6-12    rectocele repair  . Cosmetic surgery     Family History  Problem Relation  Age of Onset  . Diabetes Mother 46    type 2  . Aneurysm Father     aortic  . Cancer Father     prostate and lung/ smoker  . Cancer Sister 63    breast  . Hyperlipidemia Sister   . Hypertension Sister   . Diabetes Sister     type 2  . Other Sister     degenerative disc disease  . Thyroid disease Sister   . Hyperlipidemia Brother   . Allergies Brother   . Diabetes Daughter     type 1  . Cancer Maternal Grandmother     breast  . Heart attack Maternal Grandfather   . Heart disease Paternal Grandmother     CHF  . Kidney disease Paternal Grandfather   . Stroke Paternal Grandfather   . Hypertension Sister   . Hyperlipidemia Sister   . Diabetes Sister     type 2  . Allergies Daughter     History   Social History  . Marital Status: Married    Spouse Name: N/A    Number of Children: N/A  . Years of Education: N/A   Occupational History  . Not on file.   Social History Main Topics  . Smoking status: Former Smoker    Quit date: 12/22/1968  . Smokeless tobacco: Never Used   Comment: smoked for about 6 months  .  Alcohol Use: No     rarely  . Drug Use: No  . Sexually Active: Not on file   Other Topics Concern  . Not on file   Social History Narrative  . No narrative on file    Current Outpatient Prescriptions on File Prior to Visit  Medication Sig Dispense Refill  . acetaminophen (TYLENOL) 500 MG tablet Take 500 mg by mouth every 6 (six) hours as needed.        . ALPRAZolam (XANAX) 0.25 MG tablet Take 0.25 mg by mouth at bedtime as needed.        [name] Kitchen aspirin 81 MG tablet Take 81 mg by mouth daily.        . Calcium Carbonate-Vitamin D (CALCIUM 600+D) 600-400 MG-UNIT per tablet Take 1 tablet by mouth daily.        . chlorpheniramine-HYDROcodone (TUSSIONEX PENNKINETIC ER) 10-8 MG/5ML LQCR Take 5 mLs by mouth at bedtime as needed. cough  140 mL  0  . Cranberry 500 MG CAPS Take 1 capsule by mouth daily.        . cyclobenzaprine (FLEXERIL) 5 MG tablet Take 1 tablet (5  mg total) by mouth at bedtime as needed for muscle spasms.  30 tablet  1  . docusate sodium (COLACE) 100 MG capsule Take 100 mg by mouth daily.        [name] Kitchen guaiFENesin (MUCINEX) 600 MG 12 hr tablet Take 1 tablet (600 mg total) by mouth 2 (two) times daily.  60 tablet  2  . loratadine (CLARITIN) 10 MG tablet Take 10 mg by mouth daily as needed. Spring and fall       . Multiple Vitamin (MULTIVITAMIN) tablet Take 1 tablet by mouth daily.        . ranitidine (ZANTAC) 150 MG tablet Take 150 mg by mouth daily.        . sertraline (ZOLOFT) 100 MG tablet Take 1 tablet (100 mg total) by mouth daily.  90 tablet  3  . simvastatin (ZOCOR) 40 MG tablet Take 1 tablet (40 mg total) by mouth at bedtime.  90 tablet  3    No Known Allergies  Review of Systems  Review of Systems  Constitutional: Positive for malaise/fatigue. Negative for fever and chills.  HENT: Positive for congestion and sore throat.   Eyes: Negative for pain and discharge.  Respiratory: Positive for cough and sputum production. Negative for shortness of breath.   Cardiovascular: Negative for chest pain, palpitations and leg swelling.  Gastrointestinal: Negative for nausea, abdominal pain and diarrhea.  Genitourinary: Negative for dysuria.  Musculoskeletal: Negative for falls.  Skin: Negative for rash.  Neurological: Positive for headaches. Negative for loss of consciousness.  Endo/Heme/Allergies: Negative for polydipsia.  Psychiatric/Behavioral: Negative for depression and suicidal ideas. The patient is not nervous/anxious and does not have insomnia.     Objective  BP 116/78  Pulse 84  Temp(Src) 98.9 F (37.2 C) (Temporal)  Ht 5' 3.5" (1.613 m)  Wt 153 lb 12.8 oz (69.763 kg)  BMI 26.82 kg/m2  SpO2 95%  Physical Exam  Physical Exam  Constitutional: She is oriented to person, place, and time and well-developed, well-nourished, and in no distress. No distress.  HENT:  Head: Normocephalic and atraumatic.       Oropharynx  erythematous, TMs retracted b/l  Eyes: Conjunctivae are normal.  Neck: Neck supple. No thyromegaly present.  Cardiovascular: Normal rate, regular rhythm and normal heart sounds.   No murmur heard. Pulmonary/Chest: Effort normal and breath sounds  normal. She has no wheezes.  Abdominal: She exhibits no distension and no mass.  Musculoskeletal: She exhibits no edema.  Lymphadenopathy:    She has no cervical adenopathy.  Neurological: She is alert and oriented to person, place, and time.  Skin: Skin is warm and dry. No rash noted. She is not diaphoretic.  Psychiatric: Memory, affect and judgment normal.    Lab Results  Component Value Date   TSH 0.91 10/23/2011   Lab Results  Component Value Date   WBC 6.1 09/05/2011   HGB 14.2 09/05/2011   HCT 43.6 09/05/2011   MCV 92.2 09/05/2011   PLT 204 09/05/2011   Lab Results  Component Value Date   CREATININE 0.7 09/05/2011   BUN 14 09/05/2011   NA 142 09/05/2011   K 4.1 09/05/2011   CL 107 09/05/2011   CO2 26 09/05/2011   Lab Results  Component Value Date   ALT 29 09/05/2011   AST 32 09/05/2011   ALKPHOS 58 09/05/2011   BILITOT 0.5 09/05/2011   Lab Results  Component Value Date   CHOL 185 09/05/2011   Lab Results  Component Value Date   HDL 47.20 09/05/2011   Lab Results  Component Value Date   LDLCALC 110* 09/05/2011   Lab Results  Component Value Date   TRIG 140.0 09/05/2011   Lab Results  Component Value Date   CHOLHDL 4 09/05/2011     Assessment & Plan  Acute bronchitis Cefdinir and Mucinex bid, push clear fluids and increase rest, has not been sleeping well and coughing a lot at night is encouraged to take the cough suppressant she was already given.  Pharyngitis These symptoms fully resolved with treatment

## 2012-02-03 NOTE — Patient Instructions (Signed)
Otitis Media You or your child has otitis media. This is an infection of the middle chamber of the ear. This condition is common in young children and often follows upper respiratory infections. Symptoms of otitis media may include earache or ear fullness, hearing loss, or fever. If the eardrum ruptures, a middle ear infection may also cause bloody or pus-like discharge from the ear. Fussiness, irritability, and persistent crying may be the only signs of otitis media in small children. Otitis media can be caused by a bacteria or a virus. Antibiotics may be used to treat bacterial otitis media. But antibiotics are not effective against viral infections. Not every case of bacterial otitis media requires antibiotics and depending on age, severity of infection, and other risk factors, observation may be all that is required. Ear drops or oral medicines may be prescribed to reduce pain, fever, or congestion. Babies with ear infections should not be fed while lying on their backs. This increases the pressure and pain in the ear. Do not put cotton in the ear canal or clean it with cotton swabs. Swimming should be avoided if the eardrum has ruptured or if there is drainage from the ear canal. If your child experiences recurrent infections, your child may need to be referred to an Ear, Nose, and Throat specialist. HOME CARE INSTRUCTIONS   Take any antibiotic as directed by your caregiver. You or your child may feel better in a few days, but take all medicine or the infection may not respond and may become more difficult to treat.   Only take over-the-counter or prescription medicines for pain, discomfort, or fever as directed by your caregiver. Do not give aspirin to children.  Otitis media can lead to complications including rupture of the eardrum, long-term hearing loss, and more severe infections. Call your caregiver for follow-up care at the end of treatment. SEEK IMMEDIATE MEDICAL CARE IF:   Your or your  child's problems do not improve within 2 to 3 days.   You or your child has an oral temperature above 102 F (38.9 C), not controlled by medicine.   Your baby is older than 3 months with a rectal temperature of 102 F (38.9 C) or higher.   Your baby is 10 months old or younger with a rectal temperature of 100.4 F (38 C) or higher.   Your child develops increased fussiness.   You or your child develops a stiff neck, severe headache, or confusion.   There is swelling around the ear.   There is dizziness, vomiting, unusual sleepiness, seizures, or twitching of facial muscles.   The pain or ear drainage persists beyond 2 days of antibiotic treatment.  Document Released: 01/15/2005 Document Revised: 08/20/2011 Document Reviewed: 04/05/2010 [facility] Patient Information 2012 [place], [place]. Continue good hydration, mucinex and add a probiotic such as Align daily and a yogurt daily

## 2012-02-03 NOTE — Assessment & Plan Note (Signed)
These symptoms fully resolved with treatment

## 2012-08-12 ENCOUNTER — Other Ambulatory Visit: Payer: Self-pay | Admitting: Obstetrics and Gynecology

## 2012-08-12 DIAGNOSIS — Z1231 Encounter for screening mammogram for malignant neoplasm of breast: Secondary | ICD-10-CM

## 2012-08-16 ENCOUNTER — Encounter: Payer: Self-pay | Admitting: Obstetrics and Gynecology

## 2012-08-16 ENCOUNTER — Ambulatory Visit (INDEPENDENT_AMBULATORY_CARE_PROVIDER_SITE_OTHER): Payer: BC Managed Care – PPO | Admitting: Obstetrics and Gynecology

## 2012-08-16 ENCOUNTER — Ambulatory Visit (INDEPENDENT_AMBULATORY_CARE_PROVIDER_SITE_OTHER): Payer: BC Managed Care – PPO

## 2012-08-16 VITALS — BP 122/68 | HR 68 | Ht 63.0 in | Wt 158.0 lb

## 2012-08-16 DIAGNOSIS — Z01419 Encounter for gynecological examination (general) (routine) without abnormal findings: Secondary | ICD-10-CM

## 2012-08-16 DIAGNOSIS — Z13820 Encounter for screening for osteoporosis: Secondary | ICD-10-CM

## 2012-08-16 DIAGNOSIS — Z124 Encounter for screening for malignant neoplasm of cervix: Secondary | ICD-10-CM

## 2012-08-16 NOTE — Patient Instructions (Signed)
Patient Education Materials to be provided at check out (*indicates is located in accordion folder):  *Osteoporosis, *Calcium Recommendations  

## 2012-08-16 NOTE — Progress Notes (Signed)
The patient has never been taking hormone replacement therapy The patient  is taking a Calcium supplement. Post-menopausal bleeding:no  Last Pap: was normal April  2012 Last mammogram: was normal April  2012 Last DEXA scan : never Last colonoscopy:normal August 2007  Urinary symptoms: none Normal bowel movements: Yes Reports abuse at home: No:   Subjective:    Barbara Munoz is a 61 y.o. female G3P2 who presents for annual exam.  The patient has no complaints today.   The following portions of the patient's history were reviewed and updated as appropriate: allergies, current medications, past family history, past medical history, past social history, past surgical history and problem list.  Review of Systems Pertinent items are noted in HPI. Gastrointestinal:No change in bowel habits, no abdominal pain, no rectal bleeding Genitourinary:negative for dysuria, frequency, hematuria, nocturia and urinary incontinence    Objective:     BP 122/68  Pulse 68  Ht 5\' 3"  (1.6 m)  Wt 158 lb (71.668 kg)  BMI 27.99 kg/m2  Weight:  Wt Readings from Last 1 Encounters:  08/16/12 158 lb (71.668 kg)     BMI: Body mass index is 27.99 kg/(m^2). General Appearance: Alert, appropriate appearance for age. No acute distress HEENT: Grossly normal Neck / Thyroid: Supple, no masses, nodes or enlargement Lungs: clear to auscultation bilaterally Back: No CVA tenderness Breast Exam: No masses or nodes.No dimpling, nipple retraction or discharge. Cardiovascular: Regular rate and rhythm. S1, S2, no murmur Gastrointestinal: Soft, non-tender, no masses or organomegaly Pelvic Exam: Vulva and vagina appear normal. Bimanual exam reveals normal uterus and adnexa. Rectovaginal: normal rectal, no masses Lymphatic Exam: Non-palpable nodes in neck, clavicular, axillary, or inguinal regions Skin: no rash or abnormalities Neurologic: Normal gait and speech, no tremor  Psychiatric: Alert and oriented, appropriate  affect.     Assessment:    Normal gyn exam    Plan:   mammogram pap smear return annually or prn DEXA today: normal Follow-up:  for annual exam

## 2012-08-17 LAB — PAP IG W/ RFLX HPV ASCU

## 2012-08-27 ENCOUNTER — Ambulatory Visit (INDEPENDENT_AMBULATORY_CARE_PROVIDER_SITE_OTHER): Payer: BC Managed Care – PPO | Admitting: *Deleted

## 2012-08-27 DIAGNOSIS — Z23 Encounter for immunization: Secondary | ICD-10-CM

## 2012-09-01 ENCOUNTER — Ambulatory Visit
Admission: RE | Admit: 2012-09-01 | Discharge: 2012-09-01 | Disposition: A | Discharge status: Home / Self Care | Payer: BC Managed Care – PPO | Source: Ambulatory Visit | Attending: Obstetrics and Gynecology | Admitting: Obstetrics and Gynecology

## 2012-09-01 DIAGNOSIS — Z1231 Encounter for screening mammogram for malignant neoplasm of breast: Secondary | ICD-10-CM

## 2012-09-02 ENCOUNTER — Encounter: Payer: Self-pay | Admitting: Obstetrics and Gynecology

## 2012-09-02 NOTE — Progress Notes (Signed)
Quick Note:  Please send "Dense breast" letter to patient and document in chart when letter is sent. ______ 

## 2012-11-03 ENCOUNTER — Ambulatory Visit: Payer: BC Managed Care – PPO | Admitting: Family Medicine

## 2012-11-09 ENCOUNTER — Ambulatory Visit (INDEPENDENT_AMBULATORY_CARE_PROVIDER_SITE_OTHER): Payer: BC Managed Care – PPO | Admitting: Family Medicine

## 2012-11-09 ENCOUNTER — Encounter: Payer: Self-pay | Admitting: Family Medicine

## 2012-11-09 VITALS — BP 121/72 | HR 78 | Temp 97.7°F | Ht 63.0 in | Wt 156.0 lb

## 2012-11-09 DIAGNOSIS — F32A Depression, unspecified: Secondary | ICD-10-CM

## 2012-11-09 DIAGNOSIS — M549 Dorsalgia, unspecified: Secondary | ICD-10-CM

## 2012-11-09 DIAGNOSIS — G4700 Insomnia, unspecified: Secondary | ICD-10-CM

## 2012-11-09 DIAGNOSIS — F329 Major depressive disorder, single episode, unspecified: Secondary | ICD-10-CM

## 2012-11-09 DIAGNOSIS — F411 Generalized anxiety disorder: Secondary | ICD-10-CM

## 2012-11-09 DIAGNOSIS — F419 Anxiety disorder, unspecified: Secondary | ICD-10-CM

## 2012-11-09 DIAGNOSIS — K219 Gastro-esophageal reflux disease without esophagitis: Secondary | ICD-10-CM

## 2012-11-09 DIAGNOSIS — F3289 Other specified depressive episodes: Secondary | ICD-10-CM

## 2012-11-09 DIAGNOSIS — Z0000 Encounter for general adult medical examination without abnormal findings: Secondary | ICD-10-CM | POA: Insufficient documentation

## 2012-11-09 DIAGNOSIS — F341 Dysthymic disorder: Secondary | ICD-10-CM

## 2012-11-09 DIAGNOSIS — E785 Hyperlipidemia, unspecified: Secondary | ICD-10-CM

## 2012-11-09 LAB — TSH: TSH: 0.5 u[IU]/mL (ref 0.35–5.50)

## 2012-11-09 LAB — RENAL FUNCTION PANEL
Creatinine, Ser: 0.8 mg/dL (ref 0.4–1.2)
GFR: 77.51 mL/min (ref >60.00)
Glucose, Bld: 95 mg/dL (ref 70–99)
Phosphorus: 3 mg/dL (ref 2.3–4.6)
Potassium: 3.8 mEq/L (ref 3.5–5.1)
Sodium: 140 mEq/L (ref 135–145)

## 2012-11-09 LAB — LIPID PANEL
HDL: 37.6 mg/dL — ABNORMAL LOW (ref >39.00)
Total CHOL/HDL Ratio: 5
Triglycerides: 313 mg/dL — ABNORMAL HIGH (ref 0.0–149.0)
VLDL: 62.6 mg/dL — ABNORMAL HIGH (ref 0.0–40.0)

## 2012-11-09 LAB — HEPATIC FUNCTION PANEL
ALT: 26 U/L (ref 0–35)
Bilirubin, Direct: 0 mg/dL (ref 0.0–0.3)
Total Bilirubin: 0.7 mg/dL (ref 0.3–1.2)

## 2012-11-09 LAB — CBC
RDW: 12.7 % (ref 11.5–14.6)
WBC: 6.3 10*3/uL (ref 4.5–10.5)

## 2012-11-09 LAB — LDL CHOLESTEROL, DIRECT: Direct LDL: 112.9 mg/dL

## 2012-11-09 MED ORDER — CYCLOBENZAPRINE HCL 5 MG PO TABS: 5.0000 mg | ORAL_TABLET | ORAL | Status: DC | PRN

## 2012-11-09 MED ORDER — ALPRAZOLAM 0.25 MG PO TABS: 0.2500 mg | ORAL_TABLET | Freq: Every evening | ORAL | Status: DC | PRN

## 2012-11-09 MED ORDER — SERTRALINE HCL 100 MG PO TABS: 100.0000 mg | ORAL_TABLET | Freq: Every day | ORAL | Status: DC

## 2012-11-09 MED ORDER — SIMVASTATIN 40 MG PO TABS: 40.0000 mg | ORAL_TABLET | Freq: Every day | ORAL | Status: DC

## 2012-11-09 NOTE — Assessment & Plan Note (Signed)
Doing well on current dose of Sertraline, given refill today. Uses Alprazolam infrequently with good results.

## 2012-11-09 NOTE — Assessment & Plan Note (Signed)
Had been sleeping well until the past few days, has used the Alprazolam for a short time in the past when this occurred with good results, may try this again and will check labs to further investigate.

## 2012-11-09 NOTE — Patient Instructions (Addendum)

## 2012-11-09 NOTE — Progress Notes (Signed)
Patient ID: Barbara Munoz, female   DOB: 07/29/1951, 61 y.o.   MRN: [id number] PHILLIP SANDLER [id number] 1950/12/28 11/09/2012      Progress Note New Patient  Subjective  Chief Complaint  Chief Complaint  Patient presents with  . Annual Exam    physical    HPI  Patient is a 61 year old Caucasian female who is in today for annual exam. Overall she reports she's doing well but she has had numerous stressors lately. Her diabetic daughter delivered her last baby are marginally 2 months early this spring. Her daughter slowly recovering but she did have a placental abruption and a ruptured uterus at the same time as the delivery and things were very tenuous. Her husband's health continues to be poor. Is struggling with cancer diagnosis and has also had several trips to the emergency room for kidney stones and diarrhea. Despite all this she feels that her sertraline is helping her anxiety and depression. This week in particular she's a little more trouble with trouble sleeping for several days. She has used alprazolam very infrequently during all of this. She is denying any chest pain, palpitations, shortness of breath. She does note a mild increase in her heartburn. Take Zantac 150 mg daily and has had to take a second dose on occasion. She notes she was taking a vitamin B complex and then since stopping her symptoms have improved. No changes in bowel habits or nausea vomiting. GU complaints. No febrile illness congestion worsening headaches or other concerns noted today.  Past Medical History  Diagnosis Date  . Chicken pox as a child  . Measles as a child  . Mumps as a child  . Anxiety   . Depression   . Hyperlipidemia   . GERD (gastroesophageal reflux disease) 09/05/2011  . Overweight (BMI 25.0-29.9) 09/05/2011  . Migraine     worst during menopause  . History of cyst of breast     multiple drained during menopause  . Insomnia 09/05/2011  . History of Clostridium difficile infection   .  Allergy     seasonal  . Female bladder prolapse   . Anxiety and depression 09/08/2011  . Vertigo, benign positional 09/08/2011  . Overweight 10/06/2011  . Pharyngitis 12/24/2011  . Preventative health care 11/09/2012    Past Surgical History  Procedure Date  . Tonsillectomy 82  . Tubal ligation 1983  . Vaginal hernia repair 6-12    rectocele repair  . Cosmetic surgery   . Posterior repair 2012    Family History  Problem Relation Age of Onset  . Diabetes Mother 46    type 2  . Aneurysm Father     aortic  . Cancer Father     prostate and lung/ smoker  . Pernicious anemia Father   . Hyperlipidemia Sister   . Hypertension Sister   . Diabetes Sister     type 2  . Other Sister     degenerative disc disease  . Thyroid disease Sister   . Cancer Sister     breast  cancer s/p dbl mastectomy doing well  . Hyperlipidemia Brother   . Allergies Brother   . Diabetes Daughter     type 1  . Breast cancer Maternal Grandmother   . Heart attack Maternal Grandfather   . Heart disease Paternal Grandmother     CHF  . Pernicious anemia Paternal Grandmother   . Kidney disease Paternal Grandfather   . Stroke Paternal Grandfather   . Hypertension Sister   .  Hyperlipidemia Sister   . Diabetes Sister     type 2  . Allergies Daughter   . Breast cancer Sister     History   Social History  . Marital Status: Married    Spouse Name: N/A    Number of Children: N/A  . Years of Education: N/A   Occupational History  . Not on file.   Social History Main Topics  . Smoking status: Former Smoker    Quit date: 12/22/1968  . Smokeless tobacco: Never Used     Comment: smoked for about 6 months  . Alcohol Use: No     Comment: rarely  . Drug Use: No  . Sexually Active: Yes    Birth Control/ Protection: Post-menopausal   Other Topics Concern  . Not on file   Social History Narrative  . No narrative on file    Current Outpatient Prescriptions on File Prior to Visit  Medication Sig  Dispense Refill  . acetaminophen (TYLENOL) 500 MG tablet Take 500 mg by mouth every 6 (six) hours as needed.        [name] Kitchen aspirin 81 MG tablet Take 81 mg by mouth daily.        . Calcium Carbonate-Vitamin D (CALCIUM 600+D) 600-400 MG-UNIT per tablet Take 1 tablet by mouth daily.        . Cranberry 500 MG CAPS Take 1 capsule by mouth daily.        [name] Kitchen docusate sodium (COLACE) 100 MG capsule Take 100 mg by mouth daily.        [name] Kitchen loratadine (CLARITIN) 10 MG tablet Take 10 mg by mouth daily as needed. Spring and fall       . Multiple Vitamin (MULTIVITAMIN) tablet Take 1 tablet by mouth daily.        . ranitidine (ZANTAC) 150 MG tablet Take 150 mg by mouth daily.        . [DISCONTINUED] sertraline (ZOLOFT) 100 MG tablet Take 1 tablet (100 mg total) by mouth daily.  90 tablet  3  . [DISCONTINUED] simvastatin (ZOCOR) 40 MG tablet Take 1 tablet (40 mg total) by mouth at bedtime.  90 tablet  3    No Known Allergies  Review of Systems  Review of Systems  Constitutional: Positive for malaise/fatigue. Negative for fever and chills.  HENT: Negative for hearing loss, nosebleeds and congestion.   Eyes: Negative for discharge.  Respiratory: Negative for cough, sputum production, shortness of breath and wheezing.   Cardiovascular: Negative for chest pain, palpitations and leg swelling.  Gastrointestinal: Negative for heartburn, nausea, vomiting, abdominal pain, diarrhea, constipation and blood in stool.  Genitourinary: Negative for dysuria, urgency, frequency and hematuria.  Musculoskeletal: Negative for myalgias, back pain and falls.  Skin: Negative for rash.  Neurological: Negative for dizziness, tremors, sensory change, focal weakness, loss of consciousness, weakness and headaches.  Endo/Heme/Allergies: Negative for polydipsia. Does not bruise/bleed easily.  Psychiatric/Behavioral: Negative for depression and suicidal ideas. The patient is nervous/anxious and has insomnia.     Objective  BP 121/72   Pulse 78  Temp 97.7 F (36.5 C) (Temporal)  Ht 5\' 3"  (1.6 m)  Wt 156 lb (70.761 kg)  BMI 27.63 kg/m2  SpO2 97%  Physical Exam  Physical Exam  Constitutional: She is oriented to person, place, and time and well-developed, well-nourished, and in no distress. No distress.  HENT:  Head: Normocephalic and atraumatic.  Right Ear: External ear normal.  Left Ear: External ear normal.  Nose: Nose normal.  Mouth/Throat: Oropharynx is clear and moist. No oropharyngeal exudate.  Eyes: Conjunctivae normal are normal. Pupils are equal, round, and reactive to light. Right eye exhibits no discharge. Left eye exhibits no discharge. No scleral icterus.  Neck: Normal range of motion. Neck supple. No thyromegaly present.  Cardiovascular: Normal rate, regular rhythm, normal heart sounds and intact distal pulses.   No murmur heard. Pulmonary/Chest: Effort normal and breath sounds normal. No respiratory distress. She has no wheezes. She has no rales.  Abdominal: Soft. Bowel sounds are normal. She exhibits no distension and no mass. There is no tenderness.  Musculoskeletal: Normal range of motion. She exhibits no edema and no tenderness.  Lymphadenopathy:    She has no cervical adenopathy.  Neurological: She is alert and oriented to person, place, and time. She has normal reflexes. No cranial nerve deficit. Coordination normal.  Skin: Skin is warm and dry. No rash noted. She is not diaphoretic.  Psychiatric: Mood, memory and affect normal.       Assessment & Plan  GERD (gastroesophageal reflux disease) Recent flare, has been using Zantac OTC 150 mg daily and has on occasion taken a second dose with good results. Notes her symptoms have improved since she stopped taking her vitamin B complex, encouraged avoidance of offending foods and small, frequent meals, may try Tims prn. Call for further evaluation or referral to GI if symptoms worsen  Preventative health care Encouraged heart healthy diet,  increased exercise, adequate sleep and call if any concerns.  Insomnia Had been sleeping well until the past few days, has used the Alprazolam for a short time in the past when this occurred with good results, may try this again and will check labs to further investigate.  Hyperlipidemia Tolerating Simvastatin, check lipid panel, try MegaRed caps daily  Anxiety and depression Doing well on current dose of Sertraline, given refill today. Uses Alprazolam infrequently with good results.

## 2012-11-09 NOTE — Assessment & Plan Note (Signed)
Tolerating Simvastatin, check lipid panel, try MegaRed caps daily

## 2012-11-09 NOTE — Assessment & Plan Note (Signed)
Encouraged heart healthy diet, increased exercise, adequate sleep and call if any concerns.

## 2012-11-09 NOTE — Assessment & Plan Note (Addendum)
Recent flare, has been using Zantac OTC 150 mg daily and has on occasion taken a second dose with good results. Notes her symptoms have improved since she stopped taking her vitamin B complex, encouraged avoidance of offending foods and small, frequent meals, may try Tims prn. Call for further evaluation or referral to GI if symptoms worsen

## 2013-01-10 ENCOUNTER — Ambulatory Visit (INDEPENDENT_AMBULATORY_CARE_PROVIDER_SITE_OTHER): Payer: BC Managed Care – PPO | Admitting: Family Medicine

## 2013-01-10 ENCOUNTER — Encounter: Payer: Self-pay | Admitting: Family Medicine

## 2013-01-10 VITALS — BP 113/79 | HR 79 | Temp 99.4°F | Ht 63.0 in | Wt 154.0 lb

## 2013-01-10 DIAGNOSIS — J029 Acute pharyngitis, unspecified: Secondary | ICD-10-CM

## 2013-01-10 DIAGNOSIS — J111 Influenza due to unidentified influenza virus with other respiratory manifestations: Secondary | ICD-10-CM

## 2013-01-10 MED ORDER — HYDROCODONE-HOMATROPINE 5-1.5 MG/5ML PO SYRP: ORAL_SOLUTION | ORAL | Status: DC

## 2013-01-10 NOTE — Progress Notes (Signed)
OFFICE NOTE  01/10/2013  CC:  Chief Complaint  Patient presents with  . URI    congestion, achey, productive cough; taking mucinex and tylenol     HPI: Patient is a 62 y.o. Caucasian female who is here for respiratory complaints. Pt presents complaining of respiratory symptoms for 2  days.  Primary symptoms are: ST, lots of PND in throat. Muscle and joint aches diffusely.  Tylenol sinus and mucinex tried.  +Subjective f/c in the last 24h.  Feels somewhat better today.   Pertinent negatives: No fevers, no wheezing, and no SOB.  No pain in face or teeth.  No significant HA.  ST mild at most.   Symptoms made worse by talking/activity.  Symptoms improved by meds noted above help minimally. Smoker? no Recent sick contact? unknown Muscle or joint aches? Yes, as noted above Flu shot this season at least 2 wks ago? yes  Additional ROS: no n/v/d or abdominal pain.  No rash.  No neck stiffness.   +Mild fatigue.  +Mild appetite loss.  Pertinent PMH:  Past Medical History  Diagnosis Date  . Chicken pox as a child  . Measles as a child  . Mumps as a child  . Anxiety   . Depression   . Hyperlipidemia   . GERD (gastroesophageal reflux disease) 09/05/2011  . Overweight (BMI 25.0-29.9) 09/05/2011  . Migraine     worst during menopause  . History of cyst of breast     multiple drained during menopause  . Insomnia 09/05/2011  . History of Clostridium difficile infection   . Allergy     seasonal  . Female bladder prolapse   . Anxiety and depression 09/08/2011  . Vertigo, benign positional 09/08/2011  . Overweight 10/06/2011  . Pharyngitis 12/24/2011  . Preventative health care 11/09/2012    MEDS:  Outpatient Prescriptions Prior to Visit  Medication Sig Dispense Refill  . acetaminophen (TYLENOL) 500 MG tablet Take 500 mg by mouth every 6 (six) hours as needed.        . ALPRAZolam (XANAX) 0.25 MG tablet Take 1 tablet (0.25 mg total) by mouth at bedtime as needed for sleep or anxiety.  30  tablet  3  . aspirin 81 MG tablet Take 81 mg by mouth daily.        . Calcium Carbonate-Vitamin D (CALCIUM 600+D) 600-400 MG-UNIT per tablet Take 1 tablet by mouth daily.        . Cranberry 500 MG CAPS Take 1 capsule by mouth daily.        . cyclobenzaprine (FLEXERIL) 5 MG tablet Take 1 tablet (5 mg total) by mouth as needed.  30 tablet  3  . docusate sodium (COLACE) 100 MG capsule Take 100 mg by mouth daily.        [name] Kitchen loratadine (CLARITIN) 10 MG tablet Take 10 mg by mouth daily as needed. Spring and fall       . Melatonin 5 MG TABS Take 1 tablet by mouth at bedtime.      . metroNIDAZOLE (METROCREAM) 0.75 % cream Apply 1 application topically as needed.      . Multiple Vitamin (MULTIVITAMIN) tablet Take 1 tablet by mouth daily.        . ranitidine (ZANTAC) 150 MG tablet Take 150 mg by mouth daily.        . sertraline (ZOLOFT) 100 MG tablet Take 1 tablet (100 mg total) by mouth daily.  90 tablet  3  . simvastatin (ZOCOR) 40 MG tablet  Take 1 tablet (40 mg total) by mouth at bedtime.  90 tablet  3  . [DISCONTINUED] Omega-3 Fatty Acids (FISH OIL) 300 MG CAPS Take 1 capsule by mouth daily.       Last reviewed on 01/10/2013 10:31 AM by Jeoffrey Massed, MD  PE: Blood pressure 113/79, pulse 79, temperature 99.4 F (37.4 C), temperature source Temporal, height 5\' 3"  (1.6 m), weight 154 lb (69.854 kg), SpO2 95.00%. VS: noted--normal. Gen: alert, NAD, NONTOXIC APPEARING. HEENT: eyes without injection, drainage, or swelling.  Ears: EACs clear, TMs with normal light reflex and landmarks.  Nose: Clear rhinorrhea, with some dried, crusty exudate adherent to mildly injected mucosa.  No purulent d/c.  No paranasal sinus TTP.  No facial swelling.  Throat and mouth without focal lesion.  No pharyngial swelling, erythema, or exudate.   Neck: supple, no LAD.   LUNGS: CTA bilat, nonlabored resps.   CV: RRR, no m/r/g. EXT: no c/c/e SKIN: no rash  Rapid strep today  NEGATIVE  IMPRESSION AND  PLAN:  Influenza-like illness, vaccinated pt, less than 48h since onset of sx's. Declined tamiflu after discussion today.   Hycodan susp, 1-2 tsp q6h prn, #170ml, no RF. Discussed other supportive care.   FOLLOW UP: prn

## 2013-01-12 LAB — CULTURE, GROUP A STREP: Organism ID, Bacteria: NORMAL

## 2013-04-22 ENCOUNTER — Other Ambulatory Visit (INDEPENDENT_AMBULATORY_CARE_PROVIDER_SITE_OTHER): Payer: BC Managed Care – PPO

## 2013-04-22 DIAGNOSIS — E785 Hyperlipidemia, unspecified: Secondary | ICD-10-CM

## 2013-04-22 NOTE — Progress Notes (Signed)
Labs only

## 2013-04-25 LAB — LIPID PANEL: Total CHOL/HDL Ratio: 5

## 2013-04-27 ENCOUNTER — Encounter: Payer: Self-pay | Admitting: Family Medicine

## 2013-04-27 ENCOUNTER — Ambulatory Visit (INDEPENDENT_AMBULATORY_CARE_PROVIDER_SITE_OTHER): Payer: BC Managed Care – PPO | Admitting: Family Medicine

## 2013-04-27 VITALS — BP 104/73 | HR 77 | Temp 97.9°F | Resp 14 | Wt 155.2 lb

## 2013-04-27 DIAGNOSIS — E785 Hyperlipidemia, unspecified: Secondary | ICD-10-CM

## 2013-04-27 DIAGNOSIS — H8110 Benign paroxysmal vertigo, unspecified ear: Secondary | ICD-10-CM

## 2013-04-27 NOTE — Progress Notes (Signed)
OFFICE NOTE  04/27/2013  CC:  Chief Complaint  Patient presents with  . Follow-up    Lab Results     HPI: Patient is a 62 y.o. Caucasian female who is here for f/u hyperlipidemia, recently got FLP and it was stable compared to the one about 5 mo ago, and she remains on simvastatin 40mg  qd.  She has been treated with a statin for 10 yrs.  Has been on generic zocor 40mg  for several years.  She has increased her exercise and adjusted diet somewhat since last check.  Says she has hx of BPPV, has been having some episodes lately, daily since 04/03/13---mainly when going from supine to upright and turning to right and sitting up on side of bed (spinning sensation, but no nausea), lasts 10-20 seconds.  Feels head pressure in temples today, sometimes in back/top of head. Between bouts of vertigo she has no lightheaded/disequilibrium feeling but describes head "feeling heavy".   No fever.  No associated hearing changes or ringing in ears.    Pertinent PMH:  Past Medical History  Diagnosis Date  . Anxiety   . Hyperlipidemia   . GERD (gastroesophageal reflux disease) 09/05/2011  . Overweight (BMI 25.0-29.9) 09/05/2011  . Migraine     worst during menopause  . History of cyst of breast     multiple drained during menopause  . Insomnia 09/05/2011  . History of Clostridium difficile infection   . Allergy     seasonal  . Female bladder prolapse   . Anxiety and depression 09/08/2011  . Vertigo, benign positional 09/08/2011    MEDS:  Outpatient Prescriptions Prior to Visit  Medication Sig Dispense Refill  . ALPRAZolam (XANAX) 0.25 MG tablet Take 1 tablet (0.25 mg total) by mouth at bedtime as needed for sleep or anxiety.  30 tablet  3  . aspirin 81 MG tablet Take 81 mg by mouth daily.        . Calcium Carbonate-Vitamin D (CALCIUM 600+D) 600-400 MG-UNIT per tablet Take 1 tablet by mouth daily.        . Cranberry 500 MG CAPS Take 1 capsule by mouth daily.        [name] Kitchen docusate sodium (COLACE) 100 MG  capsule Take 100 mg by mouth daily.        [name] Kitchen KRILL OIL 1000 MG CAPS Take 1 capsule by mouth daily.      [name] Kitchen loratadine (CLARITIN) 10 MG tablet Take 10 mg by mouth daily as needed. Spring and fall       . Melatonin 5 MG TABS Take 1 tablet by mouth at bedtime.      . metroNIDAZOLE (METROCREAM) 0.75 % cream Apply 1 application topically as needed.      . Multiple Vitamin (MULTIVITAMIN) tablet Take 1 tablet by mouth daily.        . ranitidine (ZANTAC) 150 MG tablet Take 150 mg by mouth daily as needed.       . sertraline (ZOLOFT) 100 MG tablet Take 1 tablet (100 mg total) by mouth daily.  90 tablet  3  . simvastatin (ZOCOR) 40 MG tablet Take 1 tablet (40 mg total) by mouth at bedtime.  90 tablet  3  . acetaminophen (TYLENOL) 500 MG tablet Take 500 mg by mouth every 6 (six) hours as needed.        . cyclobenzaprine (FLEXERIL) 5 MG tablet Take 1 tablet (5 mg total) by mouth as needed.  30 tablet  3  . HYDROcodone-homatropine (HYCODAN) 5-1.5  MG/5ML syrup 1-2 tsp po q6h prn cough/flu-like sx's  120 mL  0   No facility-administered medications prior to visit.    PE: Blood pressure 104/73, pulse 77, temperature 97.9 F (36.6 C), temperature source Oral, resp. rate 14, weight 155 lb 4 oz (70.421 kg), SpO2 95.00%. Gen: Alert, well appearing.  Patient is oriented to person, place, time, and situation. ENT: Ears: EACs clear, normal epithelium.  TMs with good light reflex and landmarks bilaterally.  Eyes: no injection, icteris, swelling, or exudate.  EOMI, PERRLA. Nose: no drainage or turbinate edema/swelling.  No injection or focal lesion.  Mouth: lips without lesion/swelling.  Oral mucosa pink and moist.  Dentition intact and without obvious caries or gingival swelling.  Oropharynx without erythema, exudate, or swelling.  CV: RRR, no m/r/g.   LUNGS: CTA bilat, nonlabored resps, good aeration in all lung fields.  DIx-Halpike maneuvers in office today: + vertigo, nausea, and rotary nystagmus with head turned  to right.  Left side negative.  IMPRESSION AND PLAN:  Hyperlipidemia Stable.  Continue current med and diet/exercise.  Vertigo, benign positional Epley's maneuvers handout reviewed with pt and husband.  She was given this to take home and do these 3 times per day until episodes have resolved.   An After Visit Summary was printed and given to the patient.  Spent 25 min with pt today, with >50% of this time spent in counseling and care coordination regarding the above problems.  FOLLOW UP: 6-7 mo for fasting CPE

## 2013-05-01 NOTE — Assessment & Plan Note (Signed)
Stable.  Continue current med and diet/exercise.

## 2013-05-01 NOTE — Assessment & Plan Note (Signed)
Epley's maneuvers handout reviewed with pt and husband.  She was given this to take home and do these 3 times per day until episodes have resolved.

## 2013-07-04 ENCOUNTER — Encounter: Payer: Self-pay | Admitting: Family Medicine

## 2013-07-04 ENCOUNTER — Ambulatory Visit (INDEPENDENT_AMBULATORY_CARE_PROVIDER_SITE_OTHER): Payer: BC Managed Care – PPO | Admitting: Family Medicine

## 2013-07-04 VITALS — BP 118/74 | HR 78 | Temp 98.0°F | Resp 16 | Ht 63.0 in | Wt 154.0 lb

## 2013-07-04 DIAGNOSIS — R5381 Other malaise: Secondary | ICD-10-CM

## 2013-07-04 DIAGNOSIS — R5383 Other fatigue: Secondary | ICD-10-CM

## 2013-07-04 DIAGNOSIS — T148 Other injury of unspecified body region: Secondary | ICD-10-CM

## 2013-07-04 DIAGNOSIS — W57XXXA Bitten or stung by nonvenomous insect and other nonvenomous arthropods, initial encounter: Secondary | ICD-10-CM

## 2013-07-04 DIAGNOSIS — IMO0001 Reserved for inherently not codable concepts without codable children: Secondary | ICD-10-CM

## 2013-07-04 DIAGNOSIS — R7401 Elevation of levels of liver transaminase levels: Secondary | ICD-10-CM

## 2013-07-04 LAB — COMPREHENSIVE METABOLIC PANEL
ALT: 45 U/L — ABNORMAL HIGH (ref 0–35)
Albumin: 4 g/dL (ref 3.5–5.2)
BUN: 14 mg/dL (ref 6–23)
CO2: 29 mEq/L (ref 19–32)
Calcium: 10.1 mg/dL (ref 8.4–10.5)
Chloride: 106 mEq/L (ref 96–112)
Creatinine, Ser: 0.7 mg/dL (ref 0.4–1.2)
GFR: 90.23 mL/min (ref >60.00)

## 2013-07-04 LAB — CBC WITH DIFFERENTIAL/PLATELET
Eosinophils Relative: 0.6 % (ref 0.0–5.0)
HCT: 40.8 % (ref 36.0–46.0)
Hemoglobin: 13.7 g/dL (ref 12.0–15.0)
Lymphs Abs: 2.4 10*3/uL (ref 0.7–4.0)
Monocytes Relative: 13.7 % — ABNORMAL HIGH (ref 3.0–12.0)
Neutro Abs: 2.7 10*3/uL (ref 1.4–7.7)
RBC: 4.41 Mil/uL (ref 3.87–5.11)
WBC: 6 10*3/uL (ref 4.5–10.5)

## 2013-07-04 LAB — CK: Total CK: 83 U/L (ref 7–177)

## 2013-07-04 MED ORDER — DOXYCYCLINE HYCLATE 100 MG PO CAPS: 100.0000 mg | ORAL_CAPSULE | Freq: Two times a day (BID) | ORAL | Status: DC

## 2013-07-04 NOTE — Progress Notes (Signed)
OFFICE NOTE  07/04/2013  CC:  Chief Complaint  Patient presents with  . Headache  . Muscle Pain    esp in legs  . Fever    low grade, with lots of sweating     HPI: Patient is a 62 y.o. Caucasian female who is here for acute illness.   Onset about 5 d/a of HA, muscle aches, feels like f/c daily but documented temps in normal range (Tm 99.5 last night).  Has ached on/off in anterolateral neck--"lymph node area". About 2 wks ago she had 2 d of similar sx's and then things spontaneously resolved.  Muscle pain regions: extremities, anterolateral neck more than back of neck.  HA diffuse.  No vision abnormalities.   Dry cough but no nasal congestion or ST.  No wheezing or SOB.  Her sx's are present on and off --usually no more than a couple of hours of symptom free periods at times.  No known sick contacts. Appetite down, on one occasion felt nauseated but didn't vomit.  No diarrhea.  Last tick bite she recalls was about 1 mo ago.  No joint pain or swelling.  General regions of weakness are her neck and shoulders. She continues to function.   No rash.  Some calf crampiness more lately.  Sudafed, tylenol, advil.  Pertinent PMH:  Past Medical History  Diagnosis Date  . Anxiety   . Hyperlipidemia   . GERD (gastroesophageal reflux disease) 09/05/2011  . Overweight (BMI 25.0-29.9) 09/05/2011  . Migraine     worst during menopause  . History of cyst of breast     multiple drained during menopause  . Insomnia 09/05/2011  . History of Clostridium difficile infection   . Allergy     seasonal  . Female bladder prolapse   . Anxiety and depression 09/08/2011  . Vertigo, benign positional 09/08/2011   Past surgical, social, and family history reviewed and no changes noted since last office visit.  MEDS:  Outpatient Prescriptions Prior to Visit  Medication Sig Dispense Refill  . acetaminophen (TYLENOL) 500 MG tablet Take 500 mg by mouth every 6 (six) hours as needed.        . ALPRAZolam  (XANAX) 0.25 MG tablet Take 1 tablet (0.25 mg total) by mouth at bedtime as needed for sleep or anxiety.  30 tablet  3  . aspirin 81 MG tablet Take 81 mg by mouth daily.        . Calcium Carbonate-Vitamin D (CALCIUM 600+D) 600-400 MG-UNIT per tablet Take 1 tablet by mouth daily.        . Cranberry 500 MG CAPS Take 1 capsule by mouth daily.        [name] Kitchen docusate sodium (COLACE) 100 MG capsule Take 100 mg by mouth daily.        [name] Kitchen KRILL OIL 1000 MG CAPS Take 1 capsule by mouth daily.      [name] Kitchen loratadine (CLARITIN) 10 MG tablet Take 10 mg by mouth daily as needed. Spring and fall       . Melatonin 5 MG TABS Take 1 tablet by mouth at bedtime.      . metroNIDAZOLE (METROCREAM) 0.75 % cream Apply 1 application topically as needed.      . Multiple Vitamin (MULTIVITAMIN) tablet Take 1 tablet by mouth daily.        . ranitidine (ZANTAC) 150 MG tablet Take 150 mg by mouth daily as needed.       . sertraline (ZOLOFT) 100 MG tablet Take  1 tablet (100 mg total) by mouth daily.  90 tablet  3  . simvastatin (ZOCOR) 40 MG tablet Take 1 tablet (40 mg total) by mouth at bedtime.  90 tablet  3   No facility-administered medications prior to visit.    PE: Blood pressure 118/74, pulse 78, temperature 98 F (36.7 C), temperature source Temporal, resp. rate 16, height 5\' 3"  (1.6 m), weight 154 lb (69.854 kg), SpO2 96.00%. Gen: Alert, well appearing.  Patient is oriented to person, place, time, and situation. ENT: Ears: EACs clear, normal epithelium.  TMs with good light reflex and landmarks bilaterally.  Eyes: no injection, icteris, swelling, or exudate.  EOMI, PERRLA. Nose: no drainage or turbinate edema/swelling.  No injection or focal lesion.  Mouth: lips without lesion/swelling.  Oral mucosa pink and moist.  Dentition intact and without obvious caries or gingival swelling.  Oropharynx without erythema, exudate, or swelling.  Neck: no LAD or TM or thyroid nodularity.  Her thyroid is tender to palpation diffusely, with a  bit of lower anterior cervical muscle TTP.  Minimal tenderness to post cerv muscles--neck ROM normal.  No stiffness of neck or signs of meningismus. CV: RRR, no m/r/g.   LUNGS: CTA bilat, nonlabored resps, good aeration in all lung fields. ABD: soft, NT/ND EXT: no clubbing, cyanosis, or edema.   Mild tenderness to palpation over upper back, shoulders, upper arms>lower arms.  No leg muscle tenderness.  No joint swelling, tenderness, warmth, or erythema. Skin - no sores or suspicious lesions or rashes or color changes   IMPRESSION AND PLAN:  Recent intermittent periods of HA, f/c, myalgias, shoulder girdle weakness, and fatigue. DDX: tick borne illness, viral syndrome, hypothyroidism, polymyalgia rheumatica, myositis/autoimmune syndrome.  Will empirically treat with doxycycline 100mg  bid x 7d.  Therapeutic expectations and side effect profile of medication discussed today.  Patient's questions answered. Will check CBC w/diff, CMET, TSH, ESR, and CK total. Signs/symptoms to call or return for were reviewed and pt expressed understanding.  An After Visit Summary was printed and given to the patient.  FOLLOW UP: approx 10d

## 2013-07-05 LAB — HEPATITIS B SURFACE ANTIGEN: Hepatitis B Surface Ag: NEGATIVE

## 2013-07-05 NOTE — Addendum Note (Signed)
Addended by: Baldemar Lenis R on: 07/05/2013 08:12 AM   Modules accepted: Orders

## 2013-07-19 ENCOUNTER — Ambulatory Visit (INDEPENDENT_AMBULATORY_CARE_PROVIDER_SITE_OTHER): Payer: BC Managed Care – PPO | Admitting: Family Medicine

## 2013-07-19 ENCOUNTER — Encounter: Payer: Self-pay | Admitting: Family Medicine

## 2013-07-19 VITALS — BP 113/74 | HR 76 | Temp 98.2°F | Resp 16 | Ht 63.0 in | Wt 154.5 lb

## 2013-07-19 DIAGNOSIS — A94 Unspecified arthropod-borne viral fever: Secondary | ICD-10-CM

## 2013-07-19 DIAGNOSIS — B882 Other arthropod infestations: Secondary | ICD-10-CM | POA: Insufficient documentation

## 2013-07-19 DIAGNOSIS — F32A Depression, unspecified: Secondary | ICD-10-CM

## 2013-07-19 DIAGNOSIS — R7401 Elevation of levels of liver transaminase levels: Secondary | ICD-10-CM

## 2013-07-19 DIAGNOSIS — F329 Major depressive disorder, single episode, unspecified: Secondary | ICD-10-CM

## 2013-07-19 DIAGNOSIS — F341 Dysthymic disorder: Secondary | ICD-10-CM

## 2013-07-19 NOTE — Assessment & Plan Note (Addendum)
Vs viral syndrome. At any rate, things have run their course. Will repeat her AST and ALT today to document stability or not.

## 2013-07-19 NOTE — Assessment & Plan Note (Signed)
Wavering a bit lately. Gave emotional support/encouragement today. She'll get back with me if she feel like a change in med regimen is needed.  She has gone through counseling in the past.

## 2013-07-19 NOTE — Progress Notes (Signed)
OFFICE NOTE  07/19/2013  CC:  Chief Complaint  Barbara Munoz presents with  . Follow-up     HPI: Barbara Munoz is a 62 y.o. Caucasian female who is here for 10d f/u recent illness characterized by HA's, myalgias, fatigue, subjective fevers.  We reviewed all labs from that visit today: ESR mildly elevated and AST/ALT both up into the 40s--very mildly elevated.  Otherwise all normal.    She is feeling like all sx's have resolved now.  She is fighting her anxiety and depression more lately but is happy with current med regimen at this time.  She wants to do a bit of introspection and decide in near future if things don't improve whether or not she wants to switch meds or increase current SSRI. She uses 1/2 tab of 0.25mg  alprazolam rarely but anticipates using this a bit more over the next week and wants to know if that would be ok.  New grandbaby in family, daughter had stressful pregnancy---pt internalized her daughter's anxiety/problems a lot.   Pertinent PMH:  Past Medical History  Diagnosis Date  . Anxiety   . Hyperlipidemia   . GERD (gastroesophageal reflux disease) 09/05/2011  . Overweight (BMI 25.0-29.9) 09/05/2011  . Migraine     worst during menopause  . History of cyst of breast     multiple drained during menopause  . Insomnia 09/05/2011  . History of Clostridium difficile infection   . Allergy     seasonal  . Female bladder prolapse   . Anxiety and depression 09/08/2011  . Vertigo, benign positional 09/08/2011   Past surgical, social, and family history reviewed and no changes noted since last office visit.  MEDS:  Outpatient Prescriptions Prior to Visit  Medication Sig Dispense Refill  . acetaminophen (TYLENOL) 500 MG tablet Take 500 mg by mouth every 6 (six) hours as needed.        . ALPRAZolam (XANAX) 0.25 MG tablet Take 1 tablet (0.25 mg total) by mouth at bedtime as needed for sleep or anxiety.  30 tablet  3  . aspirin 81 MG tablet Take 81 mg by mouth daily.        .  Calcium Carbonate-Vitamin D (CALCIUM 600+D) 600-400 MG-UNIT per tablet Take 1 tablet by mouth daily.        . Cranberry 500 MG CAPS Take 1 capsule by mouth daily.        [name] Kitchen docusate sodium (COLACE) 100 MG capsule Take 100 mg by mouth daily.        [name] Kitchen doxycycline (VIBRAMYCIN) 100 MG capsule Take 1 capsule (100 mg total) by mouth 2 (two) times daily.  14 capsule  0  . KRILL OIL 1000 MG CAPS Take 1 capsule by mouth daily.      [name] Kitchen loratadine (CLARITIN) 10 MG tablet Take 10 mg by mouth daily as needed. Spring and fall       . Melatonin 5 MG TABS Take 1 tablet by mouth at bedtime.      . metroNIDAZOLE (METROCREAM) 0.75 % cream Apply 1 application topically as needed.      . Multiple Vitamin (MULTIVITAMIN) tablet Take 1 tablet by mouth daily.        . ranitidine (ZANTAC) 150 MG tablet Take 150 mg by mouth daily as needed.       . sertraline (ZOLOFT) 100 MG tablet Take 1 tablet (100 mg total) by mouth daily.  90 tablet  3  . simvastatin (ZOCOR) 40 MG tablet Take 1 tablet (40 mg total)  by mouth at bedtime.  90 tablet  3   No facility-administered medications prior to visit.    PE: Blood pressure 113/74, pulse 76, temperature 98.2 F (36.8 C), temperature source Temporal, resp. rate 16, height 5\' 3"  (1.6 m), weight 154 lb 8 oz (70.081 kg), SpO2 95.00%. Gen: Alert, well appearing.  Barbara Munoz is oriented to person, place, time, and situation. AFFECT: pleasant but cried easily but had lucid thought and speech. No further exam today.  IMPRESSION AND PLAN:  Tick-borne disease Vs viral syndrome. At any rate, things have run their course. Will repeat her AST and ALT today to document stability or not.  Anxiety and depression Wavering a bit lately. Gave emotional support/encouragement today. She'll get back with me if she feel like a change in med regimen is needed.  She has gone through counseling in the past.   An After Visit Summary was printed and given to the Barbara Munoz.  FOLLOW UP: she plans on  routine f/u in 3-4 mo

## 2013-08-30 ENCOUNTER — Ambulatory Visit (INDEPENDENT_AMBULATORY_CARE_PROVIDER_SITE_OTHER): Payer: BC Managed Care – PPO

## 2013-08-30 DIAGNOSIS — Z23 Encounter for immunization: Secondary | ICD-10-CM

## 2013-09-28 ENCOUNTER — Other Ambulatory Visit: Payer: Self-pay | Admitting: Dermatology

## 2013-10-26 ENCOUNTER — Other Ambulatory Visit (INDEPENDENT_AMBULATORY_CARE_PROVIDER_SITE_OTHER): Payer: BC Managed Care – PPO

## 2013-10-26 DIAGNOSIS — Z0000 Encounter for general adult medical examination without abnormal findings: Secondary | ICD-10-CM

## 2013-10-26 LAB — CBC
HCT: 41.7 % (ref 36.0–46.0)
Hemoglobin: 14 g/dL (ref 12.0–15.0)
MCV: 90.2 fl (ref 78.0–100.0)
Platelets: 221 10*3/uL (ref 150.0–400.0)
RBC: 4.62 Mil/uL (ref 3.87–5.11)
WBC: 6.7 10*3/uL (ref 4.5–10.5)

## 2013-10-26 LAB — COMPREHENSIVE METABOLIC PANEL
Albumin: 4.1 g/dL (ref 3.5–5.2)
CO2: 29 mEq/L (ref 19–32)
Calcium: 9.5 mg/dL (ref 8.4–10.5)
Chloride: 105 mEq/L (ref 96–112)
GFR: 88.67 mL/min (ref >60.00)
Glucose, Bld: 94 mg/dL (ref 70–99)
Sodium: 139 mEq/L (ref 135–145)
Total Bilirubin: 0.6 mg/dL (ref 0.3–1.2)
Total Protein: 7.7 g/dL (ref 6.0–8.3)

## 2013-10-26 LAB — LIPID PANEL
Cholesterol: 181 mg/dL (ref 0–200)
VLDL: 43 mg/dL — ABNORMAL HIGH (ref 0.0–40.0)

## 2013-10-27 ENCOUNTER — Other Ambulatory Visit: Payer: BC Managed Care – PPO

## 2013-11-03 ENCOUNTER — Encounter: Payer: Self-pay | Admitting: Family Medicine

## 2013-11-03 ENCOUNTER — Ambulatory Visit (INDEPENDENT_AMBULATORY_CARE_PROVIDER_SITE_OTHER): Payer: BC Managed Care – PPO | Admitting: Family Medicine

## 2013-11-03 VITALS — BP 121/80 | HR 76 | Temp 98.6°F | Resp 18 | Ht 63.0 in | Wt 159.0 lb

## 2013-11-03 DIAGNOSIS — E785 Hyperlipidemia, unspecified: Secondary | ICD-10-CM

## 2013-11-03 DIAGNOSIS — F32A Depression, unspecified: Secondary | ICD-10-CM

## 2013-11-03 DIAGNOSIS — F329 Major depressive disorder, single episode, unspecified: Secondary | ICD-10-CM

## 2013-11-03 DIAGNOSIS — F3289 Other specified depressive episodes: Secondary | ICD-10-CM

## 2013-11-03 DIAGNOSIS — Z0000 Encounter for general adult medical examination without abnormal findings: Secondary | ICD-10-CM

## 2013-11-03 DIAGNOSIS — F411 Generalized anxiety disorder: Secondary | ICD-10-CM

## 2013-11-03 DIAGNOSIS — F419 Anxiety disorder, unspecified: Secondary | ICD-10-CM

## 2013-11-03 MED ORDER — SIMVASTATIN 40 MG PO TABS: 40.0000 mg | ORAL_TABLET | Freq: Every day | ORAL | Status: DC

## 2013-11-03 MED ORDER — METRONIDAZOLE 0.75 % EX CREA: 1.0000 "application " | TOPICAL_CREAM | CUTANEOUS | Status: DC | PRN

## 2013-11-03 MED ORDER — ALPRAZOLAM 0.25 MG PO TABS: 0.2500 mg | ORAL_TABLET | Freq: Every evening | ORAL | Status: DC | PRN

## 2013-11-03 MED ORDER — SERTRALINE HCL 100 MG PO TABS: 100.0000 mg | ORAL_TABLET | Freq: Every day | ORAL | Status: DC

## 2013-11-03 NOTE — Progress Notes (Signed)
Office Note 11/03/2013  CC:  Chief Complaint  Patient presents with  . Annual Exam    HPI:  Barbara Munoz is a 62 y.o. White female who is here for CPE. Denies acute complaint.  No questions today. Not taking calcium supplement b/c it upsets her stomach.  We discussed some different ways to try to get this in her system today to avoid GI upset.  We reviewed her recent labs in detail.  She wants to increase exercise and get "tighter" on carbs and fats in diet and keep her cholesterol med the same for now.   All other labs normal.  She will be seeing her GYN MD for annual pelvic/breast exam soon.   Past Medical History  Diagnosis Date  . Anxiety   . Hyperlipidemia   . GERD (gastroesophageal reflux disease) 09/05/2011  . Overweight (BMI 25.0-29.9) 09/05/2011  . Migraine     worst during menopause  . History of cyst of breast     multiple drained during menopause  . Insomnia 09/05/2011  . History of Clostridium difficile infection   . Allergy     seasonal  . Female bladder prolapse   . Anxiety and depression 09/08/2011  . Vertigo, benign positional 09/08/2011    Past Surgical History  Procedure Laterality Date  . Tonsillectomy  82  . Tubal ligation  1983  . Vaginal hernia repair  6-12    rectocele repair  . Cosmetic surgery    . Posterior repair  2012    Family History  Problem Relation Age of Onset  . Diabetes Mother 64    type 2  . Aneurysm Father     aortic  . Cancer Father     prostate and lung/ smoker  . Pernicious anemia Father   . Hyperlipidemia Sister   . Hypertension Sister   . Diabetes Sister     type 2  . Other Sister     degenerative disc disease  . Thyroid disease Sister   . Cancer Sister     breast  cancer s/p dbl mastectomy doing well  . Hyperlipidemia Brother   . Allergies Brother   . Diabetes Daughter     type 1  . Breast cancer Maternal Grandmother   . Heart attack Maternal Grandfather   . Heart disease Paternal Grandmother    CHF  . Pernicious anemia Paternal Grandmother   . Kidney disease Paternal Grandfather   . Stroke Paternal Grandfather   . Hypertension Sister   . Hyperlipidemia Sister   . Diabetes Sister     type 2  . Allergies Daughter   . Breast cancer Sister     History   Social History  . Marital Status: Married    Spouse Name: N/A    Number of Children: N/A  . Years of Education: N/A   Occupational History  . Not on file.   Social History Main Topics  . Smoking status: Never Smoker   . Smokeless tobacco: Never Used     Comment: smoked for about 6 months  . Alcohol Use: No     Comment: rarely  . Drug Use: No  . Sexual Activity: Yes    Birth Control/ Protection: Post-menopausal   Other Topics Concern  . Not on file   Social History Narrative   Married.    Outpatient Prescriptions Prior to Visit  Medication Sig Dispense Refill  . acetaminophen (TYLENOL) 500 MG tablet Take 500 mg by mouth every 6 (six) hours as  needed.        [name] Kitchen aspirin 81 MG tablet Take 81 mg by mouth daily.        . Cranberry 500 MG CAPS Take 1 capsule by mouth daily.        [name] Kitchen KRILL OIL 1000 MG CAPS Take 1 capsule by mouth daily.      [name] Kitchen loratadine (CLARITIN) 10 MG tablet Take 10 mg by mouth daily as needed. Spring and fall       . Melatonin 5 MG TABS Take 1 tablet by mouth at bedtime.      . Multiple Vitamin (MULTIVITAMIN) tablet Take 1 tablet by mouth daily.        . ranitidine (ZANTAC) 150 MG tablet Take 150 mg by mouth daily as needed.       . ALPRAZolam (XANAX) 0.25 MG tablet Take 1 tablet (0.25 mg total) by mouth at bedtime as needed for sleep or anxiety.  30 tablet  3  . metroNIDAZOLE (METROCREAM) 0.75 % cream Apply 1 application topically as needed.      . sertraline (ZOLOFT) 100 MG tablet Take 1 tablet (100 mg total) by mouth daily.  90 tablet  3  . simvastatin (ZOCOR) 40 MG tablet Take 1 tablet (40 mg total) by mouth at bedtime.  90 tablet  3  . Calcium Carbonate-Vitamin D (CALCIUM 600+D) 600-400  MG-UNIT per tablet Take 1 tablet by mouth daily.        [name] Kitchen docusate sodium (COLACE) 100 MG capsule Take 100 mg by mouth daily.        [name] Kitchen doxycycline (VIBRAMYCIN) 100 MG capsule Take 1 capsule (100 mg total) by mouth 2 (two) times daily.  14 capsule  0   No facility-administered medications prior to visit.    No Known Allergies  ROS Review of Systems  Constitutional: Negative for fever, chills, appetite change and fatigue.  HENT: Negative for congestion, dental problem, ear pain and sore throat.        Both submandibular regions get slightly swollen and are sensitive when she gets URIs or GE illnesses.  Eyes: Negative for discharge, redness and visual disturbance.  Respiratory: Negative for cough, chest tightness, shortness of breath and wheezing.   Cardiovascular: Negative for chest pain, palpitations and leg swelling.  Gastrointestinal: Negative for nausea, vomiting, abdominal pain, diarrhea and blood in stool.  Genitourinary: Negative for dysuria, urgency, frequency, hematuria, flank pain and difficulty urinating.  Musculoskeletal: Negative for arthralgias, back pain, joint swelling, myalgias and neck stiffness.  Skin: Negative for pallor and rash.  Neurological: Negative for dizziness, speech difficulty, weakness and headaches.  Hematological: Negative for adenopathy. Does not bruise/bleed easily.  Psychiatric/Behavioral: Negative for confusion and sleep disturbance. The patient is not nervous/anxious.      PE; Blood pressure 121/80, pulse 76, temperature 98.6 F (37 C), temperature source Temporal, resp. rate 18, height 5\' 3"  (1.6 m), weight 159 lb (72.122 kg), SpO2 96.00%. Pt's husband in room today for exam. Gen: Alert, well appearing.  Patient is oriented to person, place, time, and situation. AFFECT: pleasant, lucid thought and speech. ENT: Ears: EACs clear, normal epithelium.  TMs with good light reflex and landmarks bilaterally.  Eyes: no injection, icteris, swelling, or  exudate.  EOMI, pupils reactive to light and accomodation.  Left pupil is approx 1/2 mm larger than right  Nose: no drainage or turbinate edema/swelling.  No injection or focal lesion.  Mouth: lips without lesion/swelling.  Oral mucosa pink and moist.  Dentition intact and without  obvious caries or gingival swelling.  Oropharynx without erythema, exudate, or swelling.  Neck: supple/nontender.  No LAD, mass, or TM.  Carotid pulses 2+ bilaterally. Both submandibular glands are mildly tender to palpation but not significantly enlarged.   CV: RRR, no m/r/g.   LUNGS: CTA bilat, nonlabored resps, good aeration in all lung fields. ABD: soft, NT, ND, BS normal.  No hepatospenomegaly or mass.  No bruits. EXT: no clubbing, cyanosis, or edema.  Musculoskeletal: no joint swelling, erythema, warmth, or tenderness.  ROM of all joints intact. Skin - no sores or suspicious lesions or rashes or color changes Breast and pelvic and rectal exams deferred today.  Pertinent labs:  Lab Results  Component Value Date   TSH 1.27 10/26/2013   Lab Results  Component Value Date   WBC 6.7 10/26/2013   HGB 14.0 10/26/2013   HCT 41.7 10/26/2013   MCV 90.2 10/26/2013   PLT 221.0 10/26/2013   Lab Results  Component Value Date   CREATININE 0.7 10/26/2013   BUN 15 10/26/2013   NA 139 10/26/2013   K 4.1 10/26/2013   CL 105 10/26/2013   CO2 29 10/26/2013   Lab Results  Component Value Date   ALT 26 10/26/2013   AST 27 10/26/2013   ALKPHOS 54 10/26/2013   BILITOT 0.6 10/26/2013   Lab Results  Component Value Date   CHOL 181 10/26/2013   Lab Results  Component Value Date   HDL 40.00 10/26/2013   Lab Results  Component Value Date   LDLCALC 118* 04/22/2013   Lab Results  Component Value Date   TRIG 215.0* 10/26/2013   Lab Results  Component Value Date   CHOLHDL 5 10/26/2013   No results found for this basename: PSA    ASSESSMENT AND PLAN:   Health maintenance examination Reviewed age and gender appropriate health  maintenance issues (prudent diet, regular exercise, health risks of tobacco and excessive alcohol, use of seatbelts, fire alarms in home, use of sunscreen).  Also reviewed age and gender appropriate health screening as well as vaccine recommendations. Needs to restart her calcium supplement. She'll work on increasing exercise and cutting back more on fats and carbs in diet, keep chol med same (simva 40mg  qd). She will have her annual pap and pelvic at her GYN's office (+mammo) very soon. Physiologic anisocoria and bilateral submandibular gland sensitivity noted, pt reassured no pathologic process apparent at this time, no further w/u needed.  An After Visit Summary was printed and given to the patient.  FOLLOW UP:  Return in about 6 months (around 05/03/2014) for f/u hyperlipidemia.

## 2013-11-03 NOTE — Assessment & Plan Note (Signed)
Reviewed age and gender appropriate health maintenance issues (prudent diet, regular exercise, health risks of tobacco and excessive alcohol, use of seatbelts, fire alarms in home, use of sunscreen).  Also reviewed age and gender appropriate health screening as well as vaccine recommendations. Needs to restart her calcium supplement. She'll work on increasing exercise and cutting back more on fats and carbs in diet, keep chol med same (simva 40mg  qd). She will have her annual pap and pelvic at her GYN's office (+mammo) very soon. Physiologic anisocoria and bilateral submandibular gland sensitivity noted, pt reassured no pathologic process apparent at this time, no further w/u needed.

## 2013-11-03 NOTE — Patient Instructions (Signed)

## 2014-05-02 ENCOUNTER — Ambulatory Visit: Payer: BC Managed Care – PPO | Admitting: Family Medicine

## 2014-05-10 ENCOUNTER — Other Ambulatory Visit: Payer: Self-pay | Admitting: Family Medicine

## 2014-05-10 NOTE — Telephone Encounter (Signed)
Refill request for alprazolam Last filled by MD on - 11/03/2013 #30 x5 Last Appt:11/03/2013 Next Appt:05/24/2014 Please advise refill?

## 2014-05-17 ENCOUNTER — Ambulatory Visit (INDEPENDENT_AMBULATORY_CARE_PROVIDER_SITE_OTHER): Payer: BC Managed Care – PPO | Admitting: Nurse Practitioner

## 2014-05-17 ENCOUNTER — Encounter: Payer: Self-pay | Admitting: Nurse Practitioner

## 2014-05-17 ENCOUNTER — Other Ambulatory Visit (INDEPENDENT_AMBULATORY_CARE_PROVIDER_SITE_OTHER): Payer: BC Managed Care – PPO

## 2014-05-17 ENCOUNTER — Telehealth: Payer: Self-pay

## 2014-05-17 VITALS — BP 137/72 | HR 74 | Temp 98.4°F | Ht 63.0 in | Wt 156.0 lb

## 2014-05-17 DIAGNOSIS — L255 Unspecified contact dermatitis due to plants, except food: Secondary | ICD-10-CM

## 2014-05-17 DIAGNOSIS — R740 Nonspecific elevation of levels of transaminase and lactic acid dehydrogenase [LDH]: Secondary | ICD-10-CM

## 2014-05-17 DIAGNOSIS — E781 Pure hyperglyceridemia: Secondary | ICD-10-CM

## 2014-05-17 DIAGNOSIS — R7402 Elevation of levels of lactic acid dehydrogenase (LDH): Secondary | ICD-10-CM

## 2014-05-17 DIAGNOSIS — R7401 Elevation of levels of liver transaminase levels: Secondary | ICD-10-CM

## 2014-05-17 LAB — ALT: ALT: 25 U/L (ref 0–35)

## 2014-05-17 LAB — LIPID PANEL
Cholesterol: 160 mg/dL (ref 0–200)
HDL: 42.4 mg/dL (ref >39.00)
LDL Cholesterol: 88 mg/dL (ref 0–99)
TRIGLYCERIDES: 148 mg/dL (ref 0.0–149.0)
Total CHOL/HDL Ratio: 4
VLDL: 29.6 mg/dL (ref 0.0–40.0)

## 2014-05-17 LAB — AST: AST: 31 U/L (ref 0–37)

## 2014-05-17 MED ORDER — TRIAMCINOLONE ACETONIDE 0.1 % EX CREA: 1.0000 "application " | TOPICAL_CREAM | Freq: Two times a day (BID) | CUTANEOUS | Status: DC

## 2014-05-17 NOTE — Telephone Encounter (Signed)
Pt came in for labs. Pt states she has an appt next Wednesday. It looks like we are rechecking her cholesterol, wanted to see if Dr Anitra Lauth wanted to add any additional labs.

## 2014-05-17 NOTE — Telephone Encounter (Signed)
Ok thanks, added.

## 2014-05-17 NOTE — Telephone Encounter (Signed)
Pls add AST and ALT, dx is elevated transaminase.-thx

## 2014-05-17 NOTE — Progress Notes (Signed)
   Subjective:    Patient ID: Barbara Munoz, female    DOB: [birth year], 63 y.o.   MRN: [id number]  Rash This is a new problem. The current episode started in the past 7 days (3d). The problem has been gradually worsening since onset. The affected locations include the neck, right arm and left arm. The rash is characterized by blistering, itchiness and redness. She was exposed to plant contact. Pertinent negatives include no congestion, cough, eye pain, fatigue or fever. Past treatments include anti-itch cream (OTC cortisone). The treatment provided no relief.      Review of Systems  Constitutional: Negative for fever, chills, activity change, appetite change and fatigue.  HENT: Negative for congestion.   Eyes: Negative for pain, redness and itching.  Respiratory: Negative for cough.   Musculoskeletal: Negative for arthralgias.  Skin: Positive for color change and rash.       Objective:   Physical Exam  Vitals reviewed. Constitutional: She is oriented to person, place, and time. She appears well-developed and well-nourished. No distress.  HENT:  Head: Normocephalic and atraumatic.  Eyes: Conjunctivae are normal. Right eye exhibits no discharge. Left eye exhibits no discharge.  Cardiovascular: Normal rate.   Pulmonary/Chest: Effort normal. No respiratory distress.  Neurological: She is alert and oriented to person, place, and time.  Skin: Skin is warm and dry. Lesion and rash noted. Rash is vesicular. There is erythema.     Psychiatric: She has a normal mood and affect. Her behavior is normal. Thought content normal.          Assessment & Plan:  1. Plant dermatitis - triamcinolone cream (KENALOG) 0.1 %; Apply 1 application topically 2 (two) times daily. Use sparingly as can cause bleaching and atrophy.  Dispense: 45 g; Refill: 0 See pt instructions for skin care & pruritis.

## 2014-05-17 NOTE — Patient Instructions (Signed)
Rash will gradually fade over 2 weeks.  For itching, use ice packs and/or benadryl cream. You may take clariitin 10 mg daily. Clean skin daily with mild soap like Dove bar soap-no liquid soaps-too strong. Let us know if rash is not improving or covers arm or spreads to eye.   Poison [employer] ivy is a inflammation of the skin (contact dermatitis) caused by touching the allergens on the leaves of the ivy plant following previous exposure to the plant. The rash usually appears 48 hours after exposure. The rash is usually bumps (papules) or blisters (vesicles) in a linear pattern. Depending on your own sensitivity, the rash may simply cause redness and itching, or it may also progress to blisters which may break open. These must be well cared for to prevent secondary bacterial (germ) infection, followed by scarring. Keep any open areas dry, clean, dressed, and covered with an antibacterial ointment if needed. The eyes may also get puffy. The puffiness is worst in the morning and gets better as the day progresses. This dermatitis usually heals without scarring, within 2 to 3 weeks without treatment. HOME CARE INSTRUCTIONS  Thoroughly wash with soap and water as soon as you have been exposed to poison ivy. You have about one half hour to remove the plant resin before it will cause the rash. This washing will destroy the oil or antigen on the skin that is causing, or will cause, the rash. Be sure to wash under your fingernails as any plant resin there will continue to spread the rash. Do not rub skin vigorously when washing affected area. Poison ivy cannot spread if no oil from the plant remains on your body. A rash that has progressed to weeping sores will not spread the rash unless you have not washed thoroughly. It is also important to wash any clothes you have been wearing as these may carry active allergens. The rash will return if you wear the unwashed clothing, even several days later. Avoidance of the plant  in the future is the best measure. Poison ivy plant can be recognized by the number of leaves. Generally, poison ivy has three leaves with flowering branches on a single stem. Diphenhydramine may be purchased over the counter and used as needed for itching. Do not drive with this medication if it makes you drowsy.Ask your caregiver about medication for children. SEEK MEDICAL CARE IF:  Open sores develop.  Redness spreads beyond area of rash.  You notice purulent (pus-like) discharge.  You have increased pain.  Other signs of infection develop (such as fever). Document Released: 12/05/2000 Document Revised: 03/01/2012 Document Reviewed: 10/24/2009 [facility] Patient Information 2014 [place], [place].

## 2014-05-17 NOTE — Telephone Encounter (Signed)
Please advise 

## 2014-05-17 NOTE — Progress Notes (Signed)
Pre visit review using our clinic review tool, if applicable. No additional management support is needed unless otherwise documented below in the visit note. 

## 2014-05-18 ENCOUNTER — Encounter: Payer: Self-pay | Admitting: Family Medicine

## 2014-05-18 ENCOUNTER — Ambulatory Visit (INDEPENDENT_AMBULATORY_CARE_PROVIDER_SITE_OTHER): Payer: BC Managed Care – PPO | Admitting: Family Medicine

## 2014-05-18 VITALS — BP 115/75 | HR 72 | Temp 98.0°F | Resp 16 | Ht 63.0 in | Wt 157.0 lb

## 2014-05-18 DIAGNOSIS — L259 Unspecified contact dermatitis, unspecified cause: Secondary | ICD-10-CM | POA: Insufficient documentation

## 2014-05-18 MED ORDER — PREDNISONE 20 MG PO TABS: ORAL_TABLET | ORAL | Status: DC

## 2014-05-18 NOTE — Progress Notes (Signed)
Pre visit review using our clinic review tool, if applicable. No additional management support is needed unless otherwise documented below in the visit note. 

## 2014-05-18 NOTE — Progress Notes (Signed)
OFFICE NOTE  05/18/2014  CC:  Chief Complaint  Patient presents with  . Follow-up    pt saw Layne yesterday     HPI: Patient is a 63 y.o. Caucasian female who is here for rash. Onset about 1 wk ago, was seen yesterday and started on kenalog cream. This has helped a little with right wrist itching but pt has noted new spots on eye lid of right eye and a spot next to left eye.  Pertinent PMH:  Past medical, surgical, social, and family history reviewed and no changes are noted since last office visit.  MEDS:  Outpatient Prescriptions Prior to Visit  Medication Sig Dispense Refill  . acetaminophen (TYLENOL) 500 MG tablet Take 500 mg by mouth every 6 (six) hours as needed.        . ALPRAZolam (XANAX) 0.25 MG tablet TAKE ONE TABLET BY MOUTH NIGHTLY AT BEDTIME AS NEEDED FOR SLEEP  30 tablet  5  . aspirin 81 MG tablet Take 81 mg by mouth daily.        . Calcium Carbonate-Vitamin D (CALCIUM 600+D) 600-400 MG-UNIT per tablet Take 1 tablet by mouth daily.        . Cranberry 500 MG CAPS Take 1 capsule by mouth daily.        [name] Kitchen KRILL OIL 1000 MG CAPS Take 1 capsule by mouth daily.      [name] Kitchen loratadine (CLARITIN) 10 MG tablet Take 10 mg by mouth daily as needed. Spring and fall       . metroNIDAZOLE (METROCREAM) 0.75 % cream Apply 1 application topically as needed.  45 g  5  . Multiple Vitamin (MULTIVITAMIN) tablet Take 1 tablet by mouth daily.        . ranitidine (ZANTAC) 150 MG tablet Take 150 mg by mouth daily as needed.       . sertraline (ZOLOFT) 100 MG tablet Take 1 tablet (100 mg total) by mouth daily.  90 tablet  3  . simvastatin (ZOCOR) 40 MG tablet Take 1 tablet (40 mg total) by mouth at bedtime.  90 tablet  3  . triamcinolone cream (KENALOG) 0.1 % Apply 1 application topically 2 (two) times daily. Use sparingly as can cause bleaching and atrophy.  45 g  0   No facility-administered medications prior to visit.    PE: Blood pressure 115/75, pulse 72, temperature 98 F (36.7 C),  temperature source Temporal, resp. rate 16, height 5\' 3"  (1.6 m), weight 157 lb (71.215 kg), SpO2 96.00%. Gen: Alert, well appearing.  Patient is oriented to person, place, time, and situation. Arms and right side of neck with splotches of erythema with some papules and vesicles.  Small pinkish area without papule or vesicle on right eyelid and next to left eye. No lips/tongue swelling.  IMPRESSION AND PLAN:  1) Contact dermatitis; spreading around eyes and pt would like to proceed with systemic steroids. Will do pred 40mg  qd x 7d, then 20mg  qd x 7d, then 10mg  qd x 8d, then stop. Continue topical steroid. May continue benadryl or switch to claritin.  D/c caladryl.  An After Visit Summary was printed and given to the patient.  FOLLOW UP: prn

## 2014-05-24 ENCOUNTER — Ambulatory Visit (INDEPENDENT_AMBULATORY_CARE_PROVIDER_SITE_OTHER): Payer: BC Managed Care – PPO | Admitting: Family Medicine

## 2014-05-24 ENCOUNTER — Encounter: Payer: Self-pay | Admitting: Family Medicine

## 2014-05-24 VITALS — BP 110/67 | HR 88 | Temp 98.2°F | Resp 16 | Ht 63.0 in | Wt 156.0 lb

## 2014-05-24 DIAGNOSIS — L239 Allergic contact dermatitis, unspecified cause: Secondary | ICD-10-CM

## 2014-05-24 DIAGNOSIS — F39 Unspecified mood [affective] disorder: Secondary | ICD-10-CM

## 2014-05-24 DIAGNOSIS — E782 Mixed hyperlipidemia: Secondary | ICD-10-CM

## 2014-05-24 DIAGNOSIS — L259 Unspecified contact dermatitis, unspecified cause: Secondary | ICD-10-CM

## 2014-05-24 DIAGNOSIS — F338 Other recurrent depressive disorders: Secondary | ICD-10-CM

## 2014-05-24 NOTE — Progress Notes (Signed)
Pre visit review using our clinic review tool, if applicable. No additional management support is needed unless otherwise documented below in the visit note. 

## 2014-05-24 NOTE — Progress Notes (Signed)
OFFICE NOTE  05/24/2014  CC:  Chief Complaint  Patient presents with  . Follow-up     HPI: Patient is a 63 y.o. Caucasian female who is here for 54mo f/u hyperlipidemia.  She has been adjusting diet and activity minimally since last lipid check so she is a little surprised by her improved lipid panel recently.  Contact derm is clearing up on oral steroids but she is made more hyper/insomnia by it.  Says she is going to stick it out as dose is decreasing tomorrow.  Describes some seasonal affective d/o, feels like she is slowly pulling out of this lately, getting better.  Has feeling that she doesn't want to do things--"like I'm lazy".  Denies anhedonia, though.  Recent contact derm rash improving some on systemic steroids.  Pertinent PMH:  Past medical, surgical, social, and family history reviewed and no changes are noted since last office visit.  MEDS:  Outpatient Prescriptions Prior to Visit  Medication Sig Dispense Refill  . acetaminophen (TYLENOL) 500 MG tablet Take 500 mg by mouth every 6 (six) hours as needed.        . ALPRAZolam (XANAX) 0.25 MG tablet TAKE ONE TABLET BY MOUTH NIGHTLY AT BEDTIME AS NEEDED FOR SLEEP  30 tablet  5  . aspirin 81 MG tablet Take 81 mg by mouth daily.        . Calcium Carbonate-Vitamin D (CALCIUM 600+D) 600-400 MG-UNIT per tablet Take 1 tablet by mouth daily.        . Cranberry 500 MG CAPS Take 1 capsule by mouth daily.        [name] Kitchen KRILL OIL 1000 MG CAPS Take 1 capsule by mouth daily.      [name] Kitchen loratadine (CLARITIN) 10 MG tablet Take 10 mg by mouth daily as needed. Spring and fall       . metroNIDAZOLE (METROCREAM) 0.75 % cream Apply 1 application topically as needed.  45 g  5  . Multiple Vitamin (MULTIVITAMIN) tablet Take 1 tablet by mouth daily.        . predniSONE (DELTASONE) 20 MG tablet 2 tabs po qd x 7d, then 1 tab po qd x 7d, then 1/2 tab po qd x 8d, then stop  25 tablet  0  . ranitidine (ZANTAC) 150 MG tablet Take 150 mg by mouth daily as needed.        . sertraline (ZOLOFT) 100 MG tablet Take 1 tablet (100 mg total) by mouth daily.  90 tablet  3  . simvastatin (ZOCOR) 40 MG tablet Take 1 tablet (40 mg total) by mouth at bedtime.  90 tablet  3  . triamcinolone cream (KENALOG) 0.1 % Apply 1 application topically 2 (two) times daily. Use sparingly as can cause bleaching and atrophy.  45 g  0   No facility-administered medications prior to visit.    PE: Blood pressure 110/67, pulse 88, temperature 98.2 F (36.8 C), temperature source Temporal, resp. rate 16, height 5\' 3"  (1.6 m), weight 156 lb (70.761 kg), SpO2 97.00%. Gen: Alert, well appearing.  Patient is oriented to person, place, time, and situation. AFFECT: pleasant, lucid thought and speech. SKIN: arms, flexor surfaces with some scattered pink papular rash, some dried lesions.  NO vesicles or erythema.  LAB: none today Recent labs:  Lab Results  Component Value Date   CHOL 160 05/17/2014   HDL 42.40 05/17/2014   LDLCALC 88 05/17/2014   LDLDIRECT 110.6 10/26/2013   TRIG 148.0 05/17/2014   CHOLHDL 4 05/17/2014  Lab Results  Component Value Date   ALT 25 05/17/2014   AST 31 05/17/2014   ALKPHOS 54 10/26/2013   BILITOT 0.6 10/26/2013     IMPRESSION AND PLAN:  1) Hyperlipidemia: The current medical regimen is effective;  continue present plan and medications.  2) Allergic contact dermatitis: improving slowly in systemic steroid taper.  3) Seasonal affective d/o + hx of anxiety---improving adequately moving into summer.   Continue 100mg  zoloft qd and prn xanax.  Signs/symptoms to call or return for were reviewed and pt expressed understanding.   An After Visit Summary was printed and given to the patient.  FOLLOW UP: 4 mo

## 2014-07-31 ENCOUNTER — Other Ambulatory Visit: Payer: Self-pay

## 2014-07-31 DIAGNOSIS — Z1231 Encounter for screening mammogram for malignant neoplasm of breast: Secondary | ICD-10-CM

## 2014-08-01 ENCOUNTER — Encounter: Payer: Self-pay | Admitting: Nurse Practitioner

## 2014-08-01 ENCOUNTER — Ambulatory Visit (INDEPENDENT_AMBULATORY_CARE_PROVIDER_SITE_OTHER): Payer: BC Managed Care – PPO | Admitting: Nurse Practitioner

## 2014-08-01 VITALS — BP 108/68 | HR 79 | Temp 97.2°F | Ht 63.0 in | Wt 157.0 lb

## 2014-08-01 DIAGNOSIS — J988 Other specified respiratory disorders: Secondary | ICD-10-CM

## 2014-08-01 MED ORDER — ALBUTEROL SULFATE HFA 108 (90 BASE) MCG/ACT IN AERS: 2.0000 | INHALATION_SPRAY | Freq: Four times a day (QID) | RESPIRATORY_TRACT | Status: DC | PRN

## 2014-08-01 MED ORDER — BENZONATATE 100 MG PO CAPS: ORAL_CAPSULE | ORAL | Status: DC

## 2014-08-01 MED ORDER — DOXYCYCLINE HYCLATE 100 MG PO TABS: 100.0000 mg | ORAL_TABLET | Freq: Two times a day (BID) | ORAL | Status: DC

## 2014-08-01 NOTE — Progress Notes (Signed)
   Subjective:    Patient ID: Barbara Munoz, female    DOB: 1951-07-17, 63 y.o.   MRN: [id number]  Cough This is a chronic problem. The current episode started more than 1 month ago (5 weeks cough, several days of nasal congestion after seeing sick grand children.). The problem has been waxing and waning. The problem occurs hourly. The cough is non-productive. Associated symptoms include headaches, nasal congestion, postnasal drip and a sore throat (scratchy). Pertinent negatives include no chest pain (tightness), chills, ear congestion, ear pain, fever, shortness of breath or wheezing. Nothing aggravates the symptoms. Treatments tried: mucinex, sudafed. The treatment provided no relief.      Review of Systems  Constitutional: Negative for fever and chills.  HENT: Positive for congestion, postnasal drip, sinus pressure, sore throat (scratchy) and voice change. Negative for ear pain.   Respiratory: Positive for cough. Negative for shortness of breath and wheezing.   Cardiovascular: Negative for chest pain (tightness).  Gastrointestinal: Negative for nausea, abdominal pain and diarrhea.  Musculoskeletal: Negative for back pain.  Neurological: Positive for headaches.  Hematological: Negative for adenopathy.       Objective:   Physical Exam  Vitals reviewed. Constitutional: She is oriented to person, place, and time. She appears well-developed and well-nourished.  HENT:  Head: Normocephalic and atraumatic.  Right Ear: External ear normal.  Left Ear: External ear normal.  Mouth/Throat: Oropharynx is clear and moist. No oropharyngeal exudate.  Eyes: Conjunctivae are normal. Right eye exhibits no discharge. Left eye exhibits no discharge.  Neck: Normal range of motion. Neck supple. No thyromegaly present.  Cardiovascular: Normal rate, regular rhythm and normal heart sounds.   No murmur heard. Pulmonary/Chest: Effort normal and breath sounds normal. No respiratory distress. She has no  wheezes. She has no rales.  Lymphadenopathy:    She has cervical adenopathy (shoddy anterior cervical LAD, tender).  Neurological: She is alert and oriented to person, place, and time.  Skin: Skin is warm and dry.  Psychiatric: She has a normal mood and affect. Her behavior is normal. Thought content normal.          Assessment & Plan:  1. Respiratory infection Cough X 5 wks, nasal congestion few days. - doxycycline (VIBRA-TABS) 100 MG tablet; Take 1 tablet (100 mg total) by mouth 2 (two) times daily.  Dispense: 14 tablet; Refill: 0 - albuterol (PROVENTIL HFA;VENTOLIN HFA) 108 (90 BASE) MCG/ACT inhaler; Inhale 2 puffs into the lungs every 6 (six) hours as needed for wheezing or shortness of breath.  Dispense: 1 Inhaler; Refill: 2 - benzonatate (TESSALON) 100 MG capsule; Take 1-2 capsules po up to 3 times daily PRN cough  Dispense: 60 capsule; Refill: 0 F/u 2 weeks.

## 2014-08-01 NOTE — Progress Notes (Signed)
Pre visit review using our clinic review tool, if applicable. No additional management support is needed unless otherwise documented below in the visit note. 

## 2014-08-01 NOTE — Patient Instructions (Signed)
You have a viral illness that has caused head congestion. Cough may be bronchitis. For cough, you may use benzonatate capsules, albuterol inhaler, and start antibiotic.  For nasal congestion, start daily sinus rinses (Neilmed Sinus rinse). You may also use pseudoephedrine 30 -60 mg twice daily for 4-6 days. For sore throat use benzocaine throat lozenges. Please return in 2 weeks or sooner if you develop fever. Feel better!  Acute Bronchitis Bronchitis is inflammation of the airways that extend from the windpipe into the lungs (bronchi). The inflammation often causes mucus to develop. This leads to a cough, which is the most common symptom of bronchitis.  In acute bronchitis, the condition usually develops suddenly and goes away over time, usually in a couple weeks. Smoking, allergies, and asthma can make bronchitis worse. Repeated episodes of bronchitis may cause further lung problems.  CAUSES Acute bronchitis is most often caused by the same virus that causes a cold. The virus can spread from person to person (contagious).  SIGNS AND SYMPTOMS   Cough.   Fever.   Coughing up mucus.   Body aches.   Chest congestion.   Chills.   Shortness of breath.   Sore throat.  DIAGNOSIS  Acute bronchitis is usually diagnosed through a physical exam. Tests, such as chest X-rays, are sometimes done to rule out other conditions.  TREATMENT  Acute bronchitis usually goes away in a couple weeks. Often times, no medical treatment is necessary. Medicines are sometimes given for relief of fever or cough. Antibiotics are usually not needed but may be prescribed in certain situations. In some cases, an inhaler may be recommended to help reduce shortness of breath and control the cough. A cool mist vaporizer may also be used to help thin bronchial secretions and make it easier to clear the chest.  HOME CARE INSTRUCTIONS  Get plenty of rest.   Drink enough fluids to keep your urine clear or pale  yellow (unless you have a medical condition that requires fluid restriction). Increasing fluids may help thin your secretions and will prevent dehydration.   Only take over-the-counter or prescription medicines as directed by your health care provider.   Avoid smoking and secondhand smoke. Exposure to cigarette smoke or irritating chemicals will make bronchitis worse. If you are a smoker, consider using nicotine gum or skin patches to help control withdrawal symptoms. Quitting smoking will help your lungs heal faster.   Reduce the chances of another bout of acute bronchitis by washing your hands frequently, avoiding people with cold symptoms, and trying not to touch your hands to your mouth, nose, or eyes.   Follow up with your health care provider as directed.  SEEK MEDICAL CARE IF: Your symptoms do not improve after 1 week of treatment.  SEEK IMMEDIATE MEDICAL CARE IF:  You develop an increased fever or chills.   You have chest pain.   You have severe shortness of breath.  You have bloody sputum.   You develop dehydration.  You develop fainting.  You develop repeated vomiting.  You develop a severe headache. MAKE SURE YOU:   Understand these instructions.  Will watch your condition.  Will get help right away if you are not doing well or get worse. Document Released: 01/15/2005 Document Revised: 08/10/2013 Document Reviewed: 05/31/2013 [facility] Patient Information 2014 [place].  Sinusitis Sinusitis is redness, soreness, and swelling (inflammation) of the paranasal sinuses. Paranasal sinuses are air pockets within the bones of your face (beneath the eyes, the middle of the forehead, or  above the eyes). In healthy paranasal sinuses, mucus is able to drain out, and air is able to circulate through them by way of your nose. However, when your paranasal sinuses are inflamed, mucus and air can become trapped. This can allow bacteria and other germs to grow and cause  infection. Sinusitis can develop quickly and last only a short time (acute) or continue over a long period (chronic). Sinusitis that lasts for more than 12 weeks is considered chronic.  CAUSES  Causes of sinusitis include:  Allergies.  Structural abnormalities, such as displacement of the cartilage that separates your nostrils (deviated septum), which can decrease the air flow through your nose and sinuses and affect sinus drainage.  Functional abnormalities, such as when the small hairs (cilia) that line your sinuses and help remove mucus do not work properly or are not present. SYMPTOMS  Symptoms of acute and chronic sinusitis are the same. The primary symptoms are pain and pressure around the affected sinuses. Other symptoms include:  Upper toothache.  Earache.  Headache.  Bad breath.  Decreased sense of smell and taste.  A cough, which worsens when you are lying flat.  Fatigue.  Fever.  Thick drainage from your nose, which often is green and may contain pus (purulent).  Swelling and warmth over the affected sinuses. DIAGNOSIS  Your caregiver will perform a physical exam. During the exam, your caregiver may:  Look in your nose for signs of abnormal growths in your nostrils (nasal polyps).  Tap over the affected sinus to check for signs of infection.  View the inside of your sinuses (endoscopy) with a special imaging device with a light attached (endoscope), which is inserted into your sinuses. If your caregiver suspects that you have chronic sinusitis, one or more of the following tests may be recommended:  Allergy tests.  Nasal culture A sample of mucus is taken from your nose and sent to a lab and screened for bacteria.  Nasal cytology A sample of mucus is taken from your nose and examined by your caregiver to determine if your sinusitis is related to an allergy. TREATMENT  Most cases of acute sinusitis are related to a viral infection and will resolve on their  own within 10 days. Sometimes medicines are prescribed to help relieve symptoms (pain medicine, decongestants, nasal steroid sprays, or saline sprays).  However, for sinusitis related to a bacterial infection, your caregiver will prescribe antibiotic medicines. These are medicines that will help kill the bacteria causing the infection.  Rarely, sinusitis is caused by a fungal infection. In theses cases, your caregiver will prescribe antifungal medicine. For some cases of chronic sinusitis, surgery is needed. Generally, these are cases in which sinusitis recurs more than 3 times per year, despite other treatments. HOME CARE INSTRUCTIONS   Drink plenty of water. Water helps thin the mucus so your sinuses can drain more easily.  Use a humidifier.  Inhale steam 3 to 4 times a day (for example, sit in the bathroom with the shower running).  Apply a warm, moist washcloth to your face 3 to 4 times a day, or as directed by your caregiver.  Use saline nasal sprays to help moisten and clean your sinuses.  Take over-the-counter or prescription medicines for pain, discomfort, or fever only as directed by your caregiver. SEEK IMMEDIATE MEDICAL CARE IF:  You have increasing pain or severe headaches.  You have nausea, vomiting, or drowsiness.  You have swelling around your face.  You have vision problems.  You  have a stiff neck.  You have difficulty breathing. MAKE SURE YOU:   Understand these instructions.  Will watch your condition.  Will get help right away if you are not doing well or get worse. Document Released: 12/08/2005 Document Revised: 03/01/2012 Document Reviewed: 12/23/2011 [facility] Patient Information 2014 [place], [place].

## 2014-08-10 ENCOUNTER — Ambulatory Visit
Admission: RE | Admit: 2014-08-10 | Discharge: 2014-08-10 | Disposition: A | Discharge status: Home / Self Care | Payer: BC Managed Care – PPO | Source: Ambulatory Visit

## 2014-08-10 DIAGNOSIS — Z1231 Encounter for screening mammogram for malignant neoplasm of breast: Secondary | ICD-10-CM

## 2014-08-15 ENCOUNTER — Ambulatory Visit (INDEPENDENT_AMBULATORY_CARE_PROVIDER_SITE_OTHER): Payer: BC Managed Care – PPO | Admitting: Family Medicine

## 2014-08-15 ENCOUNTER — Encounter: Payer: Self-pay | Admitting: Family Medicine

## 2014-08-15 VITALS — BP 115/70 | HR 73 | Temp 98.0°F | Resp 18 | Ht 63.0 in | Wt 156.0 lb

## 2014-08-15 DIAGNOSIS — J209 Acute bronchitis, unspecified: Secondary | ICD-10-CM

## 2014-08-15 DIAGNOSIS — J069 Acute upper respiratory infection, unspecified: Secondary | ICD-10-CM

## 2014-08-15 DIAGNOSIS — J9801 Acute bronchospasm: Secondary | ICD-10-CM

## 2014-08-15 MED ORDER — PREDNISONE 20 MG PO TABS: ORAL_TABLET | ORAL | Status: DC

## 2014-08-15 MED ORDER — HYDROCODONE-HOMATROPINE 5-1.5 MG/5ML PO SYRP: ORAL_SOLUTION | ORAL | Status: DC

## 2014-08-15 NOTE — Progress Notes (Signed)
Pre visit review using our clinic review tool, if applicable. No additional management support is needed unless otherwise documented below in the visit note. 

## 2014-08-15 NOTE — Progress Notes (Signed)
OFFICE NOTE  08/15/2014  CC:  Chief Complaint  Patient presents with  . Follow-up    saw Layne for UPR 08/01/14, pt not better.   [name] Kitchen Headache  . Cough  . Fatigue     HPI: Patient is a 63 y.o. Caucasian female who is here for ongoing resp sx's. She is not improving regarding her cough, still with some fatigue/achiness in general.   Was last here two weeks ago and was rx'd doxycycline x 7d and tessalon perles and albuterol inhaler. She is able to decrease use of tessalon and albuterol.   Her cough is no longer productive.  Sinus congestion/PND is improved quite a bit.  No fevers.  No n/v/d.    Pertinent PMH:  Past medical, surgical, social, and family history reviewed and no changes are noted since last office visit.  MEDS:  Outpatient Prescriptions Prior to Visit  Medication Sig Dispense Refill  . acetaminophen (TYLENOL) 500 MG tablet Take 500 mg by mouth every 6 (six) hours as needed.        [name] Kitchen albuterol (PROVENTIL HFA;VENTOLIN HFA) 108 (90 BASE) MCG/ACT inhaler Inhale 2 puffs into the lungs every 6 (six) hours as needed for wheezing or shortness of breath.  1 Inhaler  2  . ALPRAZolam (XANAX) 0.25 MG tablet TAKE ONE TABLET BY MOUTH NIGHTLY AT BEDTIME AS NEEDED FOR SLEEP  30 tablet  5  . aspirin 81 MG tablet Take 81 mg by mouth daily.        . benzonatate (TESSALON) 100 MG capsule Take 1-2 capsules po up to 3 times daily PRN cough  60 capsule  0  . Calcium Carbonate-Vitamin D (CALCIUM 600+D) 600-400 MG-UNIT per tablet Take 1 tablet by mouth daily.        . Cranberry 500 MG CAPS Take 1 capsule by mouth daily.        [name] Kitchen KRILL OIL 1000 MG CAPS Take 1 capsule by mouth daily.      [name] Kitchen loratadine (CLARITIN) 10 MG tablet Take 10 mg by mouth daily as needed. Spring and fall       . metroNIDAZOLE (METROCREAM) 0.75 % cream Apply 1 application topically as needed.  45 g  5  . Multiple Vitamin (MULTIVITAMIN) tablet Take 1 tablet by mouth daily.        . Omega-3 Fatty Acids (FISH OIL PO) Take by  mouth daily.      . ranitidine (ZANTAC) 150 MG tablet Take 150 mg by mouth daily as needed.       . sertraline (ZOLOFT) 100 MG tablet Take 1 tablet (100 mg total) by mouth daily.  90 tablet  3  . simvastatin (ZOCOR) 40 MG tablet Take 1 tablet (40 mg total) by mouth at bedtime.  90 tablet  3  . doxycycline (VIBRA-TABS) 100 MG tablet Take 1 tablet (100 mg total) by mouth 2 (two) times daily.  14 tablet  0   No facility-administered medications prior to visit.    PE: Blood pressure 115/70, pulse 73, temperature 98 F (36.7 C), temperature source Temporal, resp. rate 18, height 5\' 3"  (1.6 m), weight 156 lb (70.761 kg), SpO2 98.00%. Gen: Alert, well appearing.  Patient is oriented to person, place, time, and situation. ENT: Ears: EACs clear, normal epithelium.  TMs with good light reflex and landmarks bilaterally.  Eyes: no injection, icteris, swelling, or exudate.  EOMI, PERRLA. Nose: no drainage or turbinate edema/swelling.  No injection or focal lesion.  Mouth: lips without lesion/swelling.  Oral  mucosa pink and moist.  Dentition intact and without obvious caries or gingival swelling.  Oropharynx without erythema, exudate, or swelling.  NECK: she has mildly tender, symmetric submandib region LAD. CV: RRR, no m/r/g.   LUNGS: CTA bilat, nonlabored resps, good aeration in all lung fields.  With forced expiratory maneuver she has mildly prolonged expiratory phase, diminished aeration, and she stifles a cough.  Breathing is nonlabored. EXT: no clubbing, cyanosis, or edema.    IMPRESSION AND PLAN:  URI resolving appropriately, now with more prominent acute bronchitis symptoms. No sign of bacterial infection. Just a hint of bronchospasm. Will continue albuterol HFA prn, continue tessalon perles, add prednisone 40mg  qd x 5d, then 20mg  qd x 5d. Therapeutic expectations and side effect profile of medication discussed today.  Patient's questions answered. Also add hycodan susp 1-2 tsp qhs for cough at  night to allow some rest. Signs/symptoms to call or return for were reviewed and pt expressed understanding.  An After Visit Summary was printed and given to the patient.  FOLLOW UP: prn--if not signif improved in 7d or if worsening before that.

## 2014-10-16 ENCOUNTER — Ambulatory Visit: Payer: BC Managed Care – PPO | Admitting: Family Medicine

## 2014-10-23 ENCOUNTER — Encounter: Payer: Self-pay | Admitting: Family Medicine

## 2014-11-06 ENCOUNTER — Ambulatory Visit (INDEPENDENT_AMBULATORY_CARE_PROVIDER_SITE_OTHER): Payer: BC Managed Care – PPO | Admitting: Family Medicine

## 2014-11-06 ENCOUNTER — Encounter: Payer: Self-pay | Admitting: Family Medicine

## 2014-11-06 ENCOUNTER — Ambulatory Visit: Payer: BC Managed Care – PPO | Admitting: Family Medicine

## 2014-11-06 VITALS — BP 135/79 | HR 78 | Temp 99.0°F | Resp 18 | Ht 63.0 in | Wt 158.0 lb

## 2014-11-06 DIAGNOSIS — F418 Other specified anxiety disorders: Secondary | ICD-10-CM

## 2014-11-06 DIAGNOSIS — G5782 Other specified mononeuropathies of left lower limb: Secondary | ICD-10-CM

## 2014-11-06 DIAGNOSIS — E785 Hyperlipidemia, unspecified: Secondary | ICD-10-CM

## 2014-11-06 DIAGNOSIS — G588 Other specified mononeuropathies: Secondary | ICD-10-CM | POA: Insufficient documentation

## 2014-11-06 DIAGNOSIS — F411 Generalized anxiety disorder: Secondary | ICD-10-CM

## 2014-11-06 DIAGNOSIS — F329 Major depressive disorder, single episode, unspecified: Secondary | ICD-10-CM

## 2014-11-06 DIAGNOSIS — F419 Anxiety disorder, unspecified: Secondary | ICD-10-CM

## 2014-11-06 DIAGNOSIS — F32A Depression, unspecified: Secondary | ICD-10-CM

## 2014-11-06 DIAGNOSIS — Z0000 Encounter for general adult medical examination without abnormal findings: Secondary | ICD-10-CM

## 2014-11-06 DIAGNOSIS — G5762 Lesion of plantar nerve, left lower limb: Secondary | ICD-10-CM

## 2014-11-06 DIAGNOSIS — Z23 Encounter for immunization: Secondary | ICD-10-CM

## 2014-11-06 LAB — CBC WITH DIFFERENTIAL/PLATELET
BASOS ABS: 0 10*3/uL (ref 0.0–0.1)
Basophils Relative: 0.6 % (ref 0.0–3.0)
EOS ABS: 0.1 10*3/uL (ref 0.0–0.7)
Eosinophils Relative: 1.5 % (ref 0.0–5.0)
HEMATOCRIT: 42.9 % (ref 36.0–46.0)
HEMOGLOBIN: 14.2 g/dL (ref 12.0–15.0)
LYMPHS ABS: 3.4 10*3/uL (ref 0.7–4.0)
Lymphocytes Relative: 47.4 % — ABNORMAL HIGH (ref 12.0–46.0)
MCHC: 33.1 g/dL (ref 30.0–36.0)
MCV: 91.5 fl (ref 78.0–100.0)
Monocytes Absolute: 0.6 10*3/uL (ref 0.1–1.0)
Monocytes Relative: 8.1 % (ref 3.0–12.0)
NEUTROS ABS: 3.1 10*3/uL (ref 1.4–7.7)
Neutrophils Relative %: 42.4 % — ABNORMAL LOW (ref 43.0–77.0)
PLATELETS: 190 10*3/uL (ref 150.0–400.0)
RBC: 4.69 Mil/uL (ref 3.87–5.11)
RDW: 13.7 % (ref 11.5–15.5)
WBC: 7.2 10*3/uL (ref 4.0–10.5)

## 2014-11-06 MED ORDER — SIMVASTATIN 40 MG PO TABS: 40.0000 mg | ORAL_TABLET | Freq: Every day | ORAL | Status: DC

## 2014-11-06 MED ORDER — SERTRALINE HCL 100 MG PO TABS: 100.0000 mg | ORAL_TABLET | Freq: Every day | ORAL | Status: DC

## 2014-11-06 MED ORDER — ALPRAZOLAM 0.25 MG PO TABS
0.2500 mg | ORAL_TABLET | Freq: Two times a day (BID) | ORAL | Status: DC | PRN
Start: 2014-11-06 — End: 2016-08-29

## 2014-11-06 NOTE — Progress Notes (Signed)
Pre visit review using our clinic review tool, if applicable. No additional management support is needed unless otherwise documented below in the visit note. 

## 2014-11-06 NOTE — Assessment & Plan Note (Signed)
Reassured pt of benign nature of this condition. Treat prn with metatarsal pad insert in shoe, wear wide shoes, avoid high-heels.

## 2014-11-06 NOTE — Assessment & Plan Note (Signed)
Reviewed age and gender appropriate health maintenance issues (prudent diet, regular exercise, health risks of tobacco and excessive alcohol, use of seatbelts, fire alarms in home, use of sunscreen).  Also reviewed age and gender appropriate health screening as well as vaccine recommendations. CBC, CMET, TSH today. Recent FLP 04/2015 was fine--simvastatin rx renewed today. Colon ca screening UTD, next colonoscopy due 2017. She gets approp cervical and breast ca screening via her GYN.

## 2014-11-06 NOTE — Progress Notes (Signed)
Office Note 11/06/2014  CC:  Chief Complaint  Patient presents with  . Annual Exam   HPI:  Barbara Munoz is a 63 y.o. White female who is here for annual CPE. Dr. Cletis Media is her GYN: mammogram UTD/normal.  Pap/pelvic 07/2014 ok but she had a pelvic sonogram b/c of fear of large left ovary but this turned out normal.  Eye exam and derm visit UTD. 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 Some on/off physical issues of her own lately. Has started taking the xanax 0.25mg  qd- bid. Sertraline: taking 100 mg qd. Asks if counseling is appropriate.  About 2 yrs, pain in both 3rd and 4th toes of left foot.  No injury recalled.  Says they cramp sometimes.  Sometimes they just feel stiff.  The pain is fleeting, never long enough or severe enough to take OTC pain med.   Past Medical History  Diagnosis Date  . Hyperlipidemia   . GERD (gastroesophageal reflux disease) 09/05/2011  . Overweight (BMI 25.0-29.9) 09/05/2011  . Migraine     worst during menopause  . History of cyst of breast     multiple drained during menopause  . Insomnia 09/05/2011  . History of Clostridium difficile infection 2007  . Seasonal allergic rhinitis   . Female bladder prolapse   . Anxiety and depression 09/08/2011  . Vertigo, benign positional 09/08/2011    Past Surgical History  Procedure Laterality Date  . Tonsillectomy  82  . Tubal ligation  1983  . Vaginal hernia repair  6-12    rectocele repair  . Cosmetic surgery    . Posterior repair  2012  . Colonoscopy  07/2006    pseudomembranous colitis    Family History  Problem Relation Age of Onset  . Diabetes Mother 46    type 2  . Aneurysm Father     aortic  . Cancer Father     prostate and lung/ smoker  . Pernicious anemia Father   . Hyperlipidemia Sister   . Hypertension Sister   . Diabetes Sister     type 2  . Other Sister     degenerative disc disease  . Thyroid disease Sister   . Cancer  Sister     breast  cancer s/p dbl mastectomy doing well  . Hyperlipidemia Brother   . Allergies Brother   . Diabetes Daughter     type 1  . Breast cancer Maternal Grandmother   . Heart attack Maternal Grandfather   . Heart disease Paternal Grandmother     CHF  . Pernicious anemia Paternal Grandmother   . Kidney disease Paternal Grandfather   . Stroke Paternal Grandfather   . Hypertension Sister   . Hyperlipidemia Sister   . Diabetes Sister     type 2  . Allergies Daughter   . Breast cancer Sister     History   Social History  . Marital Status: Married    Spouse Name: N/A    Number of Children: N/A  . Years of Education: N/A   Occupational History  . Not on file.   Social History Main Topics  . Smoking status: Never Smoker   . Smokeless tobacco: Never Used     Comment: smoked for about 6 months  . Alcohol Use: No     Comment: rarely  . Drug Use: No  . Sexual Activity: Yes    Birth Control/ Protection: Post-menopausal   Other Topics Concern  .  Not on file   Social History Narrative   Married.   Nonsmoker.    Outpatient Prescriptions Prior to Visit  Medication Sig Dispense Refill  . acetaminophen (TYLENOL) 500 MG tablet Take 500 mg by mouth every 6 (six) hours as needed.      [name] Kitchen albuterol (PROVENTIL HFA;VENTOLIN HFA) 108 (90 BASE) MCG/ACT inhaler Inhale 2 puffs into the lungs every 6 (six) hours as needed for wheezing or shortness of breath. 1 Inhaler 2  . aspirin 81 MG tablet Take 81 mg by mouth daily.      . Calcium Carbonate-Vitamin D (CALCIUM 600+D) 600-400 MG-UNIT per tablet Take 1 tablet by mouth daily.      . Cranberry 500 MG CAPS Take 1 capsule by mouth daily.      [name] Kitchen loratadine (CLARITIN) 10 MG tablet Take 10 mg by mouth daily as needed. Spring and fall     . Multiple Vitamin (MULTIVITAMIN) tablet Take 1 tablet by mouth daily.      . Omega-3 Fatty Acids (FISH OIL PO) Take by mouth daily.    . ranitidine (ZANTAC) 150 MG tablet Take 150 mg by mouth daily  as needed.     . ALPRAZolam (XANAX) 0.25 MG tablet TAKE ONE TABLET BY MOUTH NIGHTLY AT BEDTIME AS NEEDED FOR SLEEP 30 tablet 5  . sertraline (ZOLOFT) 100 MG tablet Take 1 tablet (100 mg total) by mouth daily. 90 tablet 3  . simvastatin (ZOCOR) 40 MG tablet Take 1 tablet (40 mg total) by mouth at bedtime. 90 tablet 3  . benzonatate (TESSALON) 100 MG capsule Take 1-2 capsules po up to 3 times daily PRN cough 60 capsule 0  . HYDROcodone-homatropine (HYCODAN) 5-1.5 MG/5ML syrup 1-2 tsp po qhs prn cough 120 mL 0  . KRILL OIL 1000 MG CAPS Take 1 capsule by mouth daily.    . metroNIDAZOLE (METROCREAM) 0.75 % cream Apply 1 application topically as needed. 45 g 5  . predniSONE (DELTASONE) 20 MG tablet 2 tabs po qd x 5d, then 1 tab po qd x 5d 15 tablet 0   No facility-administered medications prior to visit.    No Known Allergies  ROS Review of Systems  Constitutional: Negative for fever, chills, appetite change and fatigue.  HENT: Negative for congestion, dental problem, ear pain and sore throat.   Eyes: Negative for discharge, redness and visual disturbance.  Respiratory: Negative for cough, chest tightness, shortness of breath and wheezing.   Cardiovascular: Negative for chest pain, palpitations and leg swelling.  Gastrointestinal: Negative for nausea, vomiting, abdominal pain, diarrhea and blood in stool.  Genitourinary: Negative for dysuria, urgency, frequency, hematuria, flank pain and difficulty urinating.  Musculoskeletal: Negative for myalgias, back pain, joint swelling, arthralgias and neck stiffness.  Skin: Negative for pallor and rash.  Neurological: Negative for dizziness, speech difficulty, weakness and headaches.  Hematological: Negative for adenopathy. Does not bruise/bleed easily.  Psychiatric/Behavioral: Positive for dysphoric mood. Negative for confusion and sleep disturbance. The patient is nervous/anxious.     PE; Blood pressure 135/79, pulse 78, temperature 99 F (37.2  C), temperature source Temporal, resp. rate 18, height 5\' 3"  (1.6 m), weight 158 lb (71.668 kg), SpO2 96 %. Gen: Alert, well appearing.  Patient is oriented to person, place, time, and situation. AFFECT: pleasant, lucid thought and speech. ENT: Ears: EACs clear, normal epithelium.  TMs with good light reflex and landmarks bilaterally.  Eyes: no injection, icteris, swelling, or exudate.  EOMI, PERRLA. Nose: no drainage or turbinate edema/swelling.  No  injection or focal lesion.  Mouth: lips without lesion/swelling.  Oral mucosa pink and moist.  Dentition intact and without obvious caries or gingival swelling.  Oropharynx without erythema, exudate, or swelling.  Neck: supple/nontender.  No LAD, mass, or TM.  Carotid pulses 2+ bilaterally, without bruits. CV: RRR, no m/r/g.   LUNGS: CTA bilat, nonlabored resps, good aeration in all lung fields. ABD: soft, NT, ND, BS normal.  No hepatospenomegaly or mass.  No bruits. EXT: no clubbing, cyanosis, or edema.  Musculoskeletal: no joint swelling, erythema, warmth, or tenderness.  ROM of all joints intact. Skin - no sores or suspicious lesions or rashes or color changes   Pertinent labs:  None today Recent: Lab Results  Component Value Date   CHOL 160 05/17/2014   HDL 42.40 05/17/2014   LDLCALC 88 05/17/2014   LDLDIRECT 110.6 10/26/2013   TRIG 148.0 05/17/2014   CHOLHDL 4 05/17/2014   Lab Results  Component Value Date   WBC 6.7 10/26/2013   HGB 14.0 10/26/2013   HCT 41.7 10/26/2013   MCV 90.2 10/26/2013   PLT 221.0 10/26/2013     Chemistry      Component Value Date/Time   NA 139 10/26/2013 0903   K 4.1 10/26/2013 0903   CL 105 10/26/2013 0903   CO2 29 10/26/2013 0903   BUN 15 10/26/2013 0903   CREATININE 0.7 10/26/2013 0903      Component Value Date/Time   CALCIUM 9.5 10/26/2013 0903   ALKPHOS 54 10/26/2013 0903   AST 31 05/17/2014 1315   ALT 25 05/17/2014 1315   BILITOT 0.6 10/26/2013 0903     Lab Results  Component Value  Date   TSH 1.27 10/26/2013    ASSESSMENT AND PLAN:   Anxiety and depression Struggling. Increase xanax to 0.25mg  bid, new rx for 90d supply handed to pt today. Increase sertraline to 1 and 1/2 of the 100 mg tabs qd. Counseling encouraged: I gave her contact info for Doroteo Glassman.   Interdigital neuroma Reassured pt of benign nature of this condition. Treat prn with metatarsal pad insert in shoe, wear wide shoes, avoid high-heels.  Health maintenance examination Reviewed age and gender appropriate health maintenance issues (prudent diet, regular exercise, health risks of tobacco and excessive alcohol, use of seatbelts, fire alarms in home, use of sunscreen).  Also reviewed age and gender appropriate health screening as well as vaccine recommendations. CBC, CMET, TSH today. Recent FLP 04/2015 was fine--simvastatin rx renewed today. Colon ca screening UTD, next colonoscopy due 2017. She gets approp cervical and breast ca screening via her GYN.  An After Visit Summary was printed and given to the patient.  FOLLOW UP:  Return in about 2 months (around 01/06/2015) for f/u mood/anxiety.

## 2014-11-06 NOTE — Patient Instructions (Signed)
Call Doroteo Glassman [phone] for counseling appointment.

## 2014-11-06 NOTE — Assessment & Plan Note (Signed)
Struggling. Increase xanax to 0.25mg  bid, new rx for 90d supply handed to pt today. Increase sertraline to 1 and 1/2 of the 100 mg tabs qd. Counseling encouraged: I gave her contact info for Doroteo Glassman.

## 2014-11-07 LAB — COMPREHENSIVE METABOLIC PANEL
ALK PHOS: 58 U/L (ref 39–117)
ALT: 29 U/L (ref 0–35)
AST: 34 U/L (ref 0–37)
Albumin: 4.5 g/dL (ref 3.5–5.2)
BUN: 13 mg/dL (ref 6–23)
CO2: 24 mEq/L (ref 19–32)
CREATININE: 0.8 mg/dL (ref 0.4–1.2)
Calcium: 9.8 mg/dL (ref 8.4–10.5)
Chloride: 105 mEq/L (ref 96–112)
GFR: 81.7 mL/min (ref >60.00)
Glucose, Bld: 98 mg/dL (ref 70–99)
Potassium: 4.1 mEq/L (ref 3.5–5.1)
Sodium: 140 mEq/L (ref 135–145)
Total Bilirubin: 0.3 mg/dL (ref 0.2–1.2)
Total Protein: 7.4 g/dL (ref 6.0–8.3)

## 2014-11-07 LAB — TSH: TSH: 1.34 u[IU]/mL (ref 0.35–4.50)

## 2014-11-09 ENCOUNTER — Other Ambulatory Visit: Payer: Self-pay | Admitting: Family Medicine

## 2014-11-09 DIAGNOSIS — F32A Depression, unspecified: Secondary | ICD-10-CM

## 2014-11-09 DIAGNOSIS — F329 Major depressive disorder, single episode, unspecified: Secondary | ICD-10-CM

## 2014-11-09 MED ORDER — SERTRALINE HCL 100 MG PO TABS: ORAL_TABLET | ORAL | Status: DC

## 2015-01-31 ENCOUNTER — Encounter: Payer: Self-pay | Admitting: Family Medicine

## 2015-01-31 ENCOUNTER — Ambulatory Visit (INDEPENDENT_AMBULATORY_CARE_PROVIDER_SITE_OTHER): Payer: BC Managed Care – PPO | Admitting: Family Medicine

## 2015-01-31 VITALS — BP 116/74 | HR 73 | Temp 98.4°F | Resp 18 | Ht 63.0 in | Wt 153.0 lb

## 2015-01-31 DIAGNOSIS — F411 Generalized anxiety disorder: Secondary | ICD-10-CM

## 2015-01-31 NOTE — Progress Notes (Signed)
Pre visit review using our clinic review tool, if applicable. No additional management support is needed unless otherwise documented below in the visit note. 

## 2015-01-31 NOTE — Progress Notes (Signed)
OFFICE NOTE  01/31/2015  CC:  Chief Complaint  Patient presents with  . Follow-up   HPI: Patient is a 64 y.o. Caucasian female who is here for 3 mo f/u anxiety. Seeing Doroteo Glassman, psychologist, monthly x 4 now and likes her, finds her helpful. Says xanax helps, takes it nightly, occ daytime dose prn (about 1 time per week).  Says she is not having mood swings or crying spells or irritable. Still easily frustrated, easily stressed by typical life events. She wants to leave sertraline dosing where it currently is.    Pertinent PMH:  Past medical, surgical, social, and family history reviewed and no changes are noted since last office visit.  MEDS:  Outpatient Prescriptions Prior to Visit  Medication Sig Dispense Refill  . acetaminophen (TYLENOL) 500 MG tablet Take 500 mg by mouth every 6 (six) hours as needed.      [name] Kitchen albuterol (PROVENTIL HFA;VENTOLIN HFA) 108 (90 BASE) MCG/ACT inhaler Inhale 2 puffs into the lungs every 6 (six) hours as needed for wheezing or shortness of breath. 1 Inhaler 2  . ALPRAZolam (XANAX) 0.25 MG tablet Take 1 tablet (0.25 mg total) by mouth 2 (two) times daily as needed for anxiety. 180 tablet 1  . aspirin 81 MG tablet Take 81 mg by mouth daily.      . Calcium Carbonate-Vitamin D (CALCIUM 600+D) 600-400 MG-UNIT per tablet Take 1 tablet by mouth daily.      . Cranberry 500 MG CAPS Take 1 capsule by mouth daily.      [name] Kitchen loratadine (CLARITIN) 10 MG tablet Take 10 mg by mouth daily as needed. Spring and fall     . Multiple Vitamin (MULTIVITAMIN) tablet Take 1 tablet by mouth daily.      . Omega-3 Fatty Acids (FISH OIL PO) Take by mouth daily.    . ranitidine (ZANTAC) 150 MG tablet Take 150 mg by mouth daily as needed.     . sertraline (ZOLOFT) 100 MG tablet Take 1.5 tabs daily 135 tablet 3  . simvastatin (ZOCOR) 40 MG tablet Take 1 tablet (40 mg total) by mouth at bedtime. 90 tablet 3   No facility-administered medications prior to visit.    PE: Blood  pressure 116/74, pulse 73, temperature 98.4 F (36.9 C), temperature source Temporal, resp. rate 18, height 5\' 3"  (1.6 m), weight 153 lb (69.4 kg), SpO2 95 %. Gen: Alert, well appearing.  Patient is oriented to person, place, time, and situation. AFFECT: pleasant, lucid thought and speech.   IMPRESSION AND PLAN:  GAD; doing better, gradual improvement with current therapies.  She is working on becoming more comfortable with taking xanax prn in daytime.  Continue current meds and therapy with Dr. Redmond Pulling.  An After Visit Summary was printed and given to the patient.  FOLLOW UP: 4 mo

## 2015-02-12 ENCOUNTER — Encounter: Payer: Self-pay | Admitting: Family Medicine

## 2015-02-12 ENCOUNTER — Ambulatory Visit (INDEPENDENT_AMBULATORY_CARE_PROVIDER_SITE_OTHER): Payer: BC Managed Care – PPO | Admitting: Family Medicine

## 2015-02-12 VITALS — BP 118/72 | HR 94 | Temp 98.6°F | Resp 18 | Ht 63.0 in

## 2015-02-12 DIAGNOSIS — R0789 Other chest pain: Secondary | ICD-10-CM

## 2015-02-12 DIAGNOSIS — Z87898 Personal history of other specified conditions: Secondary | ICD-10-CM

## 2015-02-12 DIAGNOSIS — IMO0001 Reserved for inherently not codable concepts without codable children: Secondary | ICD-10-CM

## 2015-02-12 DIAGNOSIS — M79602 Pain in left arm: Secondary | ICD-10-CM

## 2015-02-12 NOTE — Progress Notes (Signed)
Pre visit review using our clinic review tool, if applicable. No additional management support is needed unless otherwise documented below in the visit note. 

## 2015-02-12 NOTE — Progress Notes (Signed)
OFFICE NOTE  02/12/2015  CC:  Chief Complaint  Patient presents with  . Shoulder Pain    L sided  . Back Pain    tension   . Headache    daily x 2-3 weeks  . Diarrhea   HPI: Patient is a 64 y.o. Caucasian female who is here with her husband for intermittent left shoulder pain for about a month.  No injury. The pain starts in left shoulder and runs down left arm into hand sometimes, other times just stays in shoulder area and extends to the top of trapezius area on left or into left sided neck muscles.  Sometimes the pain goes into neck area and also to right shoulder and sometimes down right arm.  Advil helps.   The pain goes across her chest as well.  Occ feels SOB but she says it is brief/fleeting and not at same time as other sx's.  Occ jaw pain--again, seems random to her. Says not all of her symptoms occur all at the same time. Her husband is here to stress some of the aspects of this that she has been complaining about the most but he was afraid she would minimize upon verbalization to me today.  No motions of the shoulder or neck elicit the pain on a predictable basis.   She says she cleans her home "aggressively" and yet these sx's are not worse with this activity.  ROS: erratic episodes of loose stools x 1-2 days, often has good energy for a few days and then feels significant fatigue for a few days.  No cough or fever.  Mood down more lately.  Pertinent PMH:  Past medical, surgical, social, and family history reviewed and no changes are noted since last office visit.  MEDS:  Outpatient Prescriptions Prior to Visit  Medication Sig Dispense Refill  . acetaminophen (TYLENOL) 500 MG tablet Take 500 mg by mouth every 6 (six) hours as needed.      [name] Kitchen albuterol (PROVENTIL HFA;VENTOLIN HFA) 108 (90 BASE) MCG/ACT inhaler Inhale 2 puffs into the lungs every 6 (six) hours as needed for wheezing or shortness of breath. 1 Inhaler 2  . ALPRAZolam (XANAX) 0.25 MG tablet Take 1 tablet  (0.25 mg total) by mouth 2 (two) times daily as needed for anxiety. 180 tablet 1  . aspirin 81 MG tablet Take 81 mg by mouth daily.      . Calcium Carbonate-Vitamin D (CALCIUM 600+D) 600-400 MG-UNIT per tablet Take 1 tablet by mouth daily.      . Cranberry 500 MG CAPS Take 1 capsule by mouth daily.      [name] Kitchen loratadine (CLARITIN) 10 MG tablet Take 10 mg by mouth daily as needed. Spring and fall     . Multiple Vitamin (MULTIVITAMIN) tablet Take 1 tablet by mouth daily.      . Omega-3 Fatty Acids (FISH OIL PO) Take by mouth daily.    . ranitidine (ZANTAC) 150 MG tablet Take 150 mg by mouth daily as needed.     . sertraline (ZOLOFT) 100 MG tablet Take 1.5 tabs daily 135 tablet 3  . simvastatin (ZOCOR) 40 MG tablet Take 1 tablet (40 mg total) by mouth at bedtime. 90 tablet 3   No facility-administered medications prior to visit.    PE: Blood pressure 118/72, pulse 94, temperature 98.6 F (37 C), temperature source Temporal, resp. rate 18, height 5\' 3"  (1.6 m), SpO2 96 %. Gen: Alert, well appearing.  Patient is oriented to person, place, time,  and situation. HWK:GSUP: no injection, icteris, swelling, or exudate.  EOMI, PERRLA. Mouth: lips without lesion/swelling.  Oral mucosa pink and moist. Oropharynx without erythema, exudate, or swelling.  Neck - No masses or thyromegaly or limitation in range of motion CV: RRR, no m/r/g.  No chest wall tenderness. LUNGS: CTA bilat, nonlabored resps, good aeration in all lung fields. ABD: soft, mild TTP in central abd just inferior to umbillicus; ND, BS normal.  No hepatospenomegaly or mass.  No bruits. EXT: no clubbing, cyanosis, or edema.  Left shoulder with normal ROM.  Mild TTP in lateral aspect of left scapula.  Mild discomfort in triceps region when patient abducts her arm fully, otherwise she has ROm of shoulder w/out any pain.  Negative impingement testing on left.  No clavicle tenderness, no AC joint tenderness.  No supraclavicular  mass/nodule/tenderness.  LAB: 12 lead EKG: NSR rate 65, normal wave morphologies, intervals, and voltages (normal EKG). No prior EKG available for comparision.  IMPRESSION AND PLAN:  1) Atypical chest pain, with various other symptoms (although not concurrent) that could be anginal equivalents in this 64 y/o female. Her only cardiac RFs are age and hyperlipidemia. EKG normal today. Will arrange stress myoview for further risk stratification. Somatization when she is stressed out is something she is aware of herself doing over the years, so we discussed this some today, but I definitely think we need to proceed with the stress test in this case. No med changes today.  An After Visit Summary was printed and given to the patient.  FOLLOW UP: To be determined based on results of pending workup.

## 2015-02-15 ENCOUNTER — Ambulatory Visit (HOSPITAL_COMMUNITY): Payer: BLUE CROSS/BLUE SHIELD | Attending: Cardiology | Admitting: Radiology

## 2015-02-15 DIAGNOSIS — Z87898 Personal history of other specified conditions: Secondary | ICD-10-CM | POA: Diagnosis not present

## 2015-02-15 DIAGNOSIS — M79602 Pain in left arm: Secondary | ICD-10-CM | POA: Diagnosis not present

## 2015-02-15 DIAGNOSIS — IMO0001 Reserved for inherently not codable concepts without codable children: Secondary | ICD-10-CM

## 2015-02-15 DIAGNOSIS — R0789 Other chest pain: Secondary | ICD-10-CM | POA: Diagnosis not present

## 2015-02-15 HISTORY — PX: CARDIOVASCULAR STRESS TEST: SHX262

## 2015-02-15 MED ORDER — TECHNETIUM TC 99M SESTAMIBI GENERIC - CARDIOLITE
30.0000 | Freq: Once | INTRAVENOUS | Status: AC | PRN
  Administered 2015-02-15: 30 via INTRAVENOUS

## 2015-02-15 MED ORDER — TECHNETIUM TC 99M SESTAMIBI GENERIC - CARDIOLITE
10.0000 | Freq: Once | INTRAVENOUS | Status: AC | PRN
  Administered 2015-02-15: 10 via INTRAVENOUS

## 2015-02-15 NOTE — Progress Notes (Signed)
[facility] [address] [place], [place] [address] [phone]    Cardiology Nuclear Med Study  Barbara Munoz is a 64 y.o. female     MRN : [id number]     DOB: 25-Apr-1951  Procedure Date: 02/15/2015  Nuclear Med Background Indication for Stress Test:  Evaluation for Ischemia History:  MPI 20 yrs ago  Cardiac Risk Factors: N/A  Symptoms:  Chest Pain and SOB   Nuclear Pre-Procedure Caffeine/Decaff Intake:  None NPO After: 8:30pm   Lungs:  clear O2 Sat: 97% on room air. IV 0.9% NS with Angio Cath:  22g  IV Site: R Hand  IV Started by:  Crissie Figures, RN  Chest Size (in):  38 Cup Size: DD  Height: 5\' 3"  (1.6 m)  Weight:  154 lb (69.854 kg)  BMI:  Body mass index is 27.29 kg/(m^2). Tech Comments:  N/A    Nuclear Med Study 1 or 2 day study: 1 day  Stress Test Type:  Stress  Reading MD: N/A  Order Authorizing Provider:  Shawnie Dapper , MD  Resting Radionuclide: Technetium 54m Sestamibi  Resting Radionuclide Dose: 11.0 mCi   Stress Radionuclide:  Technetium 37m Sestamibi  Stress Radionuclide Dose: 33.0 mCi           Stress Protocol Rest HR: 65 Stress HR: 144  Rest BP: 113/65 Stress BP: 183/97  Exercise Time (min): 4:15 METS: 6.10   Predicted Max HR: 157 bpm % Max HR: 91.72 bpm Rate Pressure Product: 82505   Dose of Adenosine (mg):  n/a Dose of Lexiscan: n/a mg  Dose of Atropine (mg): n/a Dose of Dobutamine: n/a mcg/kg/min (at max HR)  Stress Test Technologist: Perrin Maltese, EMT-P  Nuclear Technologist:  Earl Many, CNMT     Rest Procedure:  Myocardial perfusion imaging was performed at rest 45 minutes following the intravenous administration of Technetium 25m Sestamibi. Rest ECG: NSR with non-specific ST-T wave changes  Stress Procedure:  The patient exercised on the treadmill utilizing the Bruce Protocol for 4:15 minutes. The patient stopped due to fatigue, sob, and denied any chest pain.  Technetium 39m Sestamibi was injected  at peak exercise and myocardial perfusion imaging was performed after a brief delay. Stress ECG: No significant ST segment change suggestive of ischemia.  QPS Raw Data Images:  Mild breast attenuation.  Normal left ventricular size. Stress Images:  There is subtle decreased apical uptake seen at both rest and stress. This is consistent with breast attenuation artifact. Otherwise, homogeneous radiotracer uptake at rest and stress Rest Images:  As above Subtraction (SDS):  No evidence of ischemia. Transient Ischemic Dilatation (Normal <1.22):  0.92 Lung/Heart Ratio (Normal <0.45):  0.36  Quantitative Gated Spect Images QGS EDV:  76 ml QGS ESV:  30 ml  Impression Exercise Capacity:  Poor exercise capacity. BP Response:  Normal blood pressure response. Clinical Symptoms:  Fatigue, shortness of breath ECG Impression:  No significant ST segment change suggestive of ischemia. Comparison with Prior Nuclear Study: No images to compare  Overall Impression:  Low risk stress nuclear study with no areas of ischemia identified. She does however have poor exercise tolerance, was able to exercise only for 4 minutes and 15 seconds..  LV Ejection Fraction: 61%.  LV Wall Motion:  NL LV Function; NL Wall Motion  Candee Furbish, MD

## 2015-02-16 ENCOUNTER — Encounter: Payer: Self-pay | Admitting: Family Medicine

## 2015-06-22 ENCOUNTER — Ambulatory Visit (INDEPENDENT_AMBULATORY_CARE_PROVIDER_SITE_OTHER): Payer: BLUE CROSS/BLUE SHIELD | Admitting: Family Medicine

## 2015-06-22 ENCOUNTER — Encounter: Payer: Self-pay | Admitting: Family Medicine

## 2015-06-22 VITALS — BP 103/70 | HR 80 | Temp 98.0°F | Resp 16 | Ht 63.0 in | Wt 148.0 lb

## 2015-06-22 DIAGNOSIS — M7582 Other shoulder lesions, left shoulder: Secondary | ICD-10-CM

## 2015-06-22 DIAGNOSIS — F411 Generalized anxiety disorder: Secondary | ICD-10-CM

## 2015-06-22 DIAGNOSIS — F409 Phobic anxiety disorder, unspecified: Secondary | ICD-10-CM

## 2015-06-22 DIAGNOSIS — M25512 Pain in left shoulder: Secondary | ICD-10-CM

## 2015-06-22 DIAGNOSIS — M7542 Impingement syndrome of left shoulder: Secondary | ICD-10-CM

## 2015-06-22 DIAGNOSIS — F5105 Insomnia due to other mental disorder: Secondary | ICD-10-CM

## 2015-06-22 MED ORDER — METHYLPREDNISOLONE ACETATE 40 MG/ML IJ SUSP
40.0000 mg | Freq: Once | INTRAMUSCULAR | Status: AC
  Administered 2015-06-22: 40 mg via INTRA_ARTICULAR

## 2015-06-22 MED ORDER — LIDOCAINE HCL 1 % IJ SOLN
1.0000 mL | Freq: Once | INTRAMUSCULAR | Status: AC
  Administered 2015-06-22: 1 mL

## 2015-06-22 NOTE — Progress Notes (Signed)
OFFICE NOTE  07/01/2015  CC:  Chief Complaint  Patient presents with  . Follow-up    4 month f/u. Pt is not fasting.    HPI: Patient is a 64 y.o. Caucasian female who is here for 4 mo f/u anxiety.   Feeling better, learning coping skills better through counseling, compliant with sertraline, utilizing xanax appropriately.  Even trying to ween down to 1/2 tab qhs only for the last 3 wks and says this is going ok so far.  Also has c/o L shoulder pain.  Says she strained it while using it to get a recliner to go back a month or two ago and it is still giving her lots of pain, with decreased ROM resulting, esp ER/IR and aBduction pain.  No neck pain, no arm paresthesias, no radiation of the pain down the arm, no arm or hand weakness.    Pertinent PMH:  Past medical, surgical, social, and family history reviewed and no changes are noted since last office visit.  MEDS:  Outpatient Prescriptions Prior to Visit  Medication Sig Dispense Refill  . acetaminophen (TYLENOL) 500 MG tablet Take 500 mg by mouth every 6 (six) hours as needed.      [name] Kitchen albuterol (PROVENTIL HFA;VENTOLIN HFA) 108 (90 BASE) MCG/ACT inhaler Inhale 2 puffs into the lungs every 6 (six) hours as needed for wheezing or shortness of breath. 1 Inhaler 2  . ALPRAZolam (XANAX) 0.25 MG tablet Take 1 tablet (0.25 mg total) by mouth 2 (two) times daily as needed for anxiety. 180 tablet 1  . aspirin 81 MG tablet Take 81 mg by mouth daily.      . Calcium Carbonate-Vitamin D (CALCIUM 600+D) 600-400 MG-UNIT per tablet Take 1 tablet by mouth daily.      [name] Kitchen loratadine (CLARITIN) 10 MG tablet Take 10 mg by mouth daily as needed. Spring and fall     . Multiple Vitamin (MULTIVITAMIN) tablet Take 1 tablet by mouth daily.      . Omega-3 Fatty Acids (FISH OIL PO) Take by mouth daily.    . ranitidine (ZANTAC) 150 MG tablet Take 150 mg by mouth daily as needed.     . sertraline (ZOLOFT) 100 MG tablet Take 1.5 tabs daily 135 tablet 3  . simvastatin  (ZOCOR) 40 MG tablet Take 1 tablet (40 mg total) by mouth at bedtime. 90 tablet 3  . Cranberry 500 MG CAPS Take 1 capsule by mouth daily.       No facility-administered medications prior to visit.    PE: Blood pressure 103/70, pulse 80, temperature 98 F (36.7 C), temperature source Oral, resp. rate 16, height 5\' 3"  (1.6 m), weight 148 lb (67.132 kg), SpO2 97 %. Gen: Alert, well appearing.  Patient is oriented to person, place, time, and situation. AFFECT: pleasant, lucid thought and speech. Neck: spurling's neg.  No TTP.  ROM intact. L shoulder: +impingement signs, +TTP around anter and postero-olateral acromion hook.  No AC joint tenderness. Pain with active aBduction, IR, and ER.  O'brien's neg.    LABS:  Lab Results  Component Value Date   CHOL 160 05/17/2014   HDL 42.40 05/17/2014   LDLCALC 88 05/17/2014   LDLDIRECT 110.6 10/26/2013   TRIG 148.0 05/17/2014   CHOLHDL 4 05/17/2014     Chemistry      Component Value Date/Time   NA 140 11/06/2014 1457   K 4.1 11/06/2014 1457   CL 105 11/06/2014 1457   CO2 24 11/06/2014 1457   BUN  13 11/06/2014 1457   CREATININE 0.8 11/06/2014 1457      Component Value Date/Time   CALCIUM 9.8 11/06/2014 1457   ALKPHOS 58 11/06/2014 1457   AST 34 11/06/2014 1457   ALT 29 11/06/2014 1457   BILITOT 0.3 11/06/2014 1457     Lab Results  Component Value Date   WBC 7.2 11/06/2014   HGB 14.2 11/06/2014   HCT 42.9 11/06/2014   MCV 91.5 11/06/2014   PLT 190.0 11/06/2014     IMPRESSION AND PLAN:  1) Anxiety with insomnia: improving/stable.  The current medical regimen is effective;  continue present counseling and medications.  2) L shoulder RC strain w/impingement syndrome.  I am afraid her ongoing pain will lead to frozen shoulder. She was agreeable to my recommendation of L subacromial steroid injection today.  Procedure: Therapeutic left shoulder injection.  The patient's clinical condition is marked by substantial pain and/or  significant functional disability.  Other conservative therapy has not provided relief, is contraindicated, or not appropriate.  There is a reasonable likelihood that injection will significantly improve the patient's pain and/or functional disability. Cleaned skin with alcohol swab, used posterolateral approach, Injected 79m of 40mg /ml depo medrol + 2 ml of 1% plain lidocaine into subacromial space without resistance.  No immediate complications.  Patient tolerated procedure well.  Post-injection care discussed, including 20 min of icing 1-2 times in the next 4-8 hours, frequent non weight-bearing ROM exercises over the next few days, and general pain medication management.  An After Visit Summary was printed and given to the patient.  FOLLOW UP: 6 mo f/u anxiety

## 2015-06-22 NOTE — Progress Notes (Signed)
Pre visit review using our clinic review tool, if applicable. No additional management support is needed unless otherwise documented below in the visit note. 

## 2015-07-17 ENCOUNTER — Other Ambulatory Visit: Payer: Self-pay

## 2015-07-17 DIAGNOSIS — Z1231 Encounter for screening mammogram for malignant neoplasm of breast: Secondary | ICD-10-CM

## 2015-07-31 ENCOUNTER — Telehealth: Payer: Self-pay | Admitting: Family Medicine

## 2015-07-31 DIAGNOSIS — M7582 Other shoulder lesions, left shoulder: Secondary | ICD-10-CM

## 2015-07-31 DIAGNOSIS — M7542 Impingement syndrome of left shoulder: Secondary | ICD-10-CM

## 2015-07-31 NOTE — Telephone Encounter (Signed)
Referral ordered as per pt request. 

## 2015-07-31 NOTE — Telephone Encounter (Signed)
Please advise. Thanks.  

## 2015-07-31 NOTE — Telephone Encounter (Signed)
Pt called and is having worse pain i her left shoulder. She would like a referral to Dr. Shaune Leeks at [facility]. Please advise?

## 2015-08-01 NOTE — Telephone Encounter (Signed)
Pt advised and voiced understanding.   

## 2015-08-08 ENCOUNTER — Encounter: Payer: Self-pay | Admitting: Family Medicine

## 2015-08-08 ENCOUNTER — Ambulatory Visit (INDEPENDENT_AMBULATORY_CARE_PROVIDER_SITE_OTHER): Payer: BLUE CROSS/BLUE SHIELD | Admitting: Family Medicine

## 2015-08-08 VITALS — BP 121/77 | HR 74 | Temp 98.5°F | Resp 16 | Ht 63.0 in | Wt 148.0 lb

## 2015-08-08 DIAGNOSIS — J0100 Acute maxillary sinusitis, unspecified: Secondary | ICD-10-CM

## 2015-08-08 DIAGNOSIS — J320 Chronic maxillary sinusitis: Secondary | ICD-10-CM | POA: Insufficient documentation

## 2015-08-08 MED ORDER — AMOXICILLIN-POT CLAVULANATE 875-125 MG PO TABS: 1.0000 | ORAL_TABLET | Freq: Two times a day (BID) | ORAL | Status: DC

## 2015-08-08 MED ORDER — FLUTICASONE PROPIONATE 50 MCG/ACT NA SUSP: 2.0000 | Freq: Every day | NASAL | Status: DC

## 2015-08-08 NOTE — Patient Instructions (Signed)
Mucinex, flonase, augmentin, nasal saline. advil if needed.Rest. Hydration.  If no improvement in 1-2 weeks call in to ne seen again.

## 2015-08-08 NOTE — Progress Notes (Signed)
Pre visit review using our clinic review tool, if applicable. No additional management support is needed unless otherwise documented below in the visit note. 

## 2015-08-08 NOTE — Progress Notes (Signed)
Subjective:    Patient ID: Barbara Munoz, female    DOB: 1951-06-07, 64 y.o.   MRN: [id number]  HPI  Cough and congestion: Patient presents with a ten-day history of nasal congestion and cough. Patient states she has trouble with her sinuses frequently. She reports a fever approximately 10 days ago with chills, that subsided but since has had headache, cough and sinus pressure. She reports her cough is sometimes productive cough, and she feels like she has rattling in her chest. She reports frequent bronchitis. She has been using Mucinex over-the-counter, with little relief. Occasional Advil for her headaches. Patient denies any nausea, vomiting, diarrhea, rash or change in appetite. Patient has been around her grandchildren which were ill. Her husband has also been ill for 3 weeks. Patient is up-to-date on her tdap.  Never smoker Past Medical History  Diagnosis Date  . Hyperlipidemia   . GERD (gastroesophageal reflux disease) 09/05/2011  . Overweight (BMI 25.0-29.9) 09/05/2011  . Migraine     worst during menopause  . History of cyst of breast     multiple drained during menopause  . Insomnia 09/05/2011  . History of Clostridium difficile infection 2007  . Seasonal allergic rhinitis   . Female bladder prolapse   . Anxiety and depression 09/08/2011  . Vertigo, benign positional 09/08/2011   No Known Allergies Past Surgical History  Procedure Laterality Date  . Tonsillectomy  82  . Tubal ligation  1983  . Vaginal hernia repair  6-12    rectocele repair  . Cosmetic surgery    . Posterior repair  2012  . Colonoscopy  07/2006    pseudomembranous colitis  . Cardiovascular stress test  02/15/15    Exercise myocard perfusion testing: Normal EF, normal wall motion, normal perfusion.  Poor exercise capacity (4 min).   Family History  Problem Relation Age of Onset  . Diabetes Mother 75    type 2  . Aneurysm Father     aortic  . Cancer Father     prostate and lung/ smoker  .  Pernicious anemia Father   . Hyperlipidemia Sister   . Hypertension Sister   . Diabetes Sister     type 2  . Other Sister     degenerative disc disease  . Thyroid disease Sister   . Cancer Sister     breast  cancer s/p dbl mastectomy doing well  . Hyperlipidemia Brother   . Allergies Brother   . Diabetes Daughter     type 1  . Breast cancer Maternal Grandmother   . Heart attack Maternal Grandfather   . Heart disease Paternal Grandmother     CHF  . Pernicious anemia Paternal Grandmother   . Kidney disease Paternal Grandfather   . Stroke Paternal Grandfather   . Hypertension Sister   . Hyperlipidemia Sister   . Diabetes Sister     type 2  . Allergies Daughter   . Breast cancer Sister    Social History   Social History  . Marital Status: Married    Spouse Name: N/A  . Number of Children: N/A  . Years of Education: N/A   Occupational History  . Not on file.   Social History Main Topics  . Smoking status: Never Smoker   . Smokeless tobacco: Never Used     Comment: smoked for about 6 months  . Alcohol Use: No     Comment: rarely  . Drug Use: No  . Sexual Activity: Yes  Birth Control/ Protection: Post-menopausal   Other Topics Concern  . Not on file   Social History Narrative   Married.   Nonsmoker.    Review of Systems Negative, with the exception of above mentioned in HPI     Objective:   Physical Exam BP 121/77 mmHg  Pulse 74  Temp(Src) 98.5 F (36.9 C) (Temporal)  Resp 16  Ht 5\' 3"  (1.6 m)  Wt 148 lb (67.132 kg)  BMI 26.22 kg/m2  SpO2 95% Gen: Afebrile. No acute distress. Nontoxic in appearance, well-developed, well-nourished Caucasian female. Very pleasant. HENT: AT. [place]. Bilateral TM visualized shiny/full. MMM. Bilateral nares erythema and swelling present. Throat without erythema or exudates. Cough present during exam, mild hoarseness. Eyes:Pupils Equal Round Reactive to light, Conjunctiva without redness, discharge or icterus. Neck: Supple,  bilateral cervical lymphadenopathy CV: RRR Chest: CTAB, no wheeze or crackles Abd: Soft. Flat. NTND. BS present. No Masses palpated.  Skin: No rashes, purpura or petechiae.      Assessment & Plan:  Barbara Munoz is a 64 y.o. female presents to acute office visit for maxillary sinusitis. - Patient encouraged to use Mucinex and Advil as needed. Prescription for Flonase daily and Augmentin twice a day 10 days. Patient encouraged to use nasal saline 3 times a day. Rest, remain well-hydrated. - Follow-up in one week if no improvement.

## 2015-08-15 ENCOUNTER — Ambulatory Visit: Payer: BC Managed Care – PPO | Attending: Orthopedic Surgery | Admitting: Physical Therapy

## 2015-08-15 DIAGNOSIS — R29898 Other symptoms and signs involving the musculoskeletal system: Secondary | ICD-10-CM | POA: Diagnosis present

## 2015-08-15 DIAGNOSIS — M25512 Pain in left shoulder: Secondary | ICD-10-CM | POA: Diagnosis present

## 2015-08-15 NOTE — Therapy (Signed)
[facility] [place], [place], [address] Phone: [phone]   Fax:  [phone]  Physical Therapy Evaluation  Patient Details  Name: Barbara Munoz MRN: [id number] Date of Birth: April 01, 1951 Referring Provider:  Justice Britain, MD  Encounter Date: 08/15/2015      PT End of Session - 08/15/15 2036    Visit Number 1   Number of Visits 12   Date for PT Re-Evaluation 09/26/15   PT Start Time 1115   PT Stop Time 1203   PT Time Calculation (min) 48 min   Activity Tolerance Patient tolerated treatment well   Behavior During Therapy [facility] for tasks assessed/performed      Past Medical History  Diagnosis Date  . Hyperlipidemia   . GERD (gastroesophageal reflux disease) 09/05/2011  . Overweight (BMI 25.0-29.9) 09/05/2011  . Migraine     worst during menopause  . History of cyst of breast     multiple drained during menopause  . Insomnia 09/05/2011  . History of Clostridium difficile infection 2007  . Seasonal allergic rhinitis   . Female bladder prolapse   . Anxiety and depression 09/08/2011  . Vertigo, benign positional 09/08/2011    Past Surgical History  Procedure Laterality Date  . Tonsillectomy  82  . Tubal ligation  1983  . Vaginal hernia repair  6-12    rectocele repair  . Cosmetic surgery    . Posterior repair  2012  . Colonoscopy  07/2006    pseudomembranous colitis  . Cardiovascular stress test  02/15/15    Exercise myocard perfusion testing: Normal EF, normal wall motion, normal perfusion.  Poor exercise capacity (4 min).    There were no vitals filed for this visit.  Visit Diagnosis:  Left shoulder pain - Plan: PT plan of care cert/re-cert  Weakness of shoulder - Plan: PT plan of care cert/re-cert      Subjective Assessment - 08/15/15 1148    Subjective If I reach back my pain is horrible.   Limitations --  Reaching.   Patient Stated Goals Get out of pain an use my left shouler again.             [facility] PT Assessment - 08/15/15 0001    Assessment   Medical Diagnosis Adhesive capsulit of left shoulder   Onset Date/Surgical Date --  12/2014.   Precautions   Precautions None   Restrictions   Weight Bearing Restrictions No   Balance Screen   Has the patient fallen in the past 6 months No   Has the patient had a decrease in activity level because of a fear of falling?  No   Is the patient reluctant to leave their home because of a fear of falling?  No   Home Ecologist residence   Prior Function   Level of Independence Independent   ROM / Strength   AROM / PROM / Strength AROM;Strength   AROM   Overall AROM Comments Left shoulder flexion= 115 degrees; ER= 88 degrees and behind the back to L2.  The patient's right shoulder is remarkable for excessive range of motion in all directions.   Strength   Overall Strength Comments left shoulder IR= 4/5 and ER= 4+/5.   Palpation   Palpation comment --  Tender acromial ridge.   Special Tests    Special Tests Rotator Cuff Impingement   Rotator Cuff Impingment tests Neer impingement test;Hawkins- Kennedy test   Neer Impingement test  Findings Positive   Side Left   Hawkins-Kennedy test   Findings Positive   Side Left                   [facility] Adult PT Treatment/Exercise - 08/15/15 0001    Modalities   Modalities (p) Retail buyer Location (p) Left shoulder   Electrical Stimulation Action (p) 80-150 HZ constant x 20 minutes   Electrical Stimulation Goals (p) Pain                     PT Long Term Goals - 08/15/15 2118    PT LONG TERM GOAL #1   Title Ind with an HEP.   Time 4   Period Weeks   Status New   PT LONG TERM GOAL #2   Title left shoulder strength= 5/5.   Time 4   Period Weeks   Status New   PT LONG TERM GOAL #3   Title Negative Impingement test of left shoulder.   Time 4   Period Weeks   Status  New   PT LONG TERM GOAL #4   Title Perform ADL's with pain not > 3/10.   Time 4   Period Weeks   Status New               Plan - 08/15/15 2100    Clinical Impression Statement (p) The patient reports pushing up and feeling a strain in her left Tricep.  Her left shoulder began to hurt and she began to lose motion in her left shoulder.  When she reaches backward her pain rises to a 10/10.   Pt will benefit from skilled therapeutic intervention in order to improve on the following deficits (p) Pain;Decreased activity tolerance;Decreased range of motion;Decreased strength   PT Frequency (p) 3x / week   PT Duration (p) 4 weeks   PT Treatment/Interventions (p) ADLs/Self Care Home Management;Cryotherapy;Electrical Stimulation;Moist Heat;Ultrasound;Vasopneumatic Device;Manual techniques;Therapeutic activities;Therapeutic exercise;Passive range of motion         Problem List Patient Active Problem List   Diagnosis Date Noted  . Maxillary sinusitis 08/08/2015  . 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 Health maintenance examination 11/03/2013  . Preventative health care 11/09/2012  . Anxiety and depression 09/08/2011  . GERD (gastroesophageal reflux disease) 09/05/2011  . Overweight (BMI 25.0-29.9) 09/05/2011  . Insomnia 09/05/2011  . Hyperlipidemia   . Migraine     Mads Borgmeyer, [place] MPT 08/15/2015, 9:26 PM  [facility] [address] [place], [place], [address] Phone: [phone]   Fax:  [phone]

## 2015-08-16 ENCOUNTER — Ambulatory Visit
Admission: RE | Admit: 2015-08-16 | Discharge: 2015-08-16 | Disposition: A | Discharge status: Home / Self Care | Payer: BC Managed Care – PPO | Source: Ambulatory Visit

## 2015-08-16 DIAGNOSIS — Z1231 Encounter for screening mammogram for malignant neoplasm of breast: Secondary | ICD-10-CM

## 2015-08-17 ENCOUNTER — Ambulatory Visit: Payer: BC Managed Care – PPO | Admitting: Physical Therapy

## 2015-08-17 ENCOUNTER — Encounter: Payer: Self-pay | Admitting: Physical Therapy

## 2015-08-17 DIAGNOSIS — M25512 Pain in left shoulder: Secondary | ICD-10-CM | POA: Diagnosis not present

## 2015-08-17 DIAGNOSIS — R29898 Other symptoms and signs involving the musculoskeletal system: Secondary | ICD-10-CM

## 2015-08-17 NOTE — Therapy (Signed)
[facility] [place], [place], [address] Phone: [phone]   Fax:  [phone]  Physical Therapy Treatment  Patient Details  Name: Barbara Munoz MRN: [id number] Date of Birth: 05/05/1951 Referring Provider:  Tammi Sou, MD  Encounter Date: 08/17/2015      PT End of Session - 08/17/15 0804    Visit Number 2   Number of Visits 12   Date for PT Re-Evaluation 09/26/15   PT Start Time 0727   PT Stop Time 0818   PT Time Calculation (min) 51 min   Activity Tolerance Patient tolerated treatment well   Behavior During Therapy [facility] for tasks assessed/performed      Past Medical History  Diagnosis Date  . Hyperlipidemia   . GERD (gastroesophageal reflux disease) 09/05/2011  . Overweight (BMI 25.0-29.9) 09/05/2011  . Migraine     worst during menopause  . History of cyst of breast     multiple drained during menopause  . Insomnia 09/05/2011  . History of Clostridium difficile infection 2007  . Seasonal allergic rhinitis   . Female bladder prolapse   . Anxiety and depression 09/08/2011  . Vertigo, benign positional 09/08/2011    Past Surgical History  Procedure Laterality Date  . Tonsillectomy  82  . Tubal ligation  1983  . Vaginal hernia repair  6-12    rectocele repair  . Cosmetic surgery    . Posterior repair  2012  . Colonoscopy  07/2006    pseudomembranous colitis  . Cardiovascular stress test  02/15/15    Exercise myocard perfusion testing: Normal EF, normal wall motion, normal perfusion.  Poor exercise capacity (4 min).    There were no vitals filed for this visit.  Visit Diagnosis:  Left shoulder pain  Weakness of shoulder      Subjective Assessment - 08/17/15 0738    Subjective week ago had a shot which lessened pain, only occational pain with reaching behind back   Patient Stated Goals Get out of pain an use my left shouler again.   Currently in Pain? Yes   Pain Score 2    Pain Location  Shoulder   Pain Orientation Left   Pain Descriptors / Indicators Sharp;Sore   Pain Type Acute pain   Pain Onset More than a month ago   Pain Frequency Intermittent   Aggravating Factors  reaching behind back   Pain Relieving Factors rest                         [facility] Adult PT Treatment/Exercise - 08/17/15 0001    Exercises   Exercises Shoulder   Shoulder Exercises: Sidelying   External Rotation Strengthening;Left;Weights  3x10 1#   Shoulder Exercises: Standing   Other Standing Exercises RW4 with yellow t-band 3x10 each   Shoulder Exercises: Pulleys   Flexion --  61min   Other Pulley Exercises UE ranger for elevation/circles 2x10 each   Other Pulley Exercises scaption 1# 3x10   Modalities   Modalities Electrical Stimulation   Electrical Stimulation   Electrical Stimulation Location Left shoulder   Electrical Stimulation Action IFC   Electrical Stimulation Parameters 80-150HZ    Electrical Stimulation Goals Pain                PT Education - 08/17/15 0803    Education provided Yes   Education Details HEP   Person(s) Educated Patient   Methods Explanation;Demonstration;Handout   Comprehension Verbalized understanding;Returned demonstration  PT Long Term Goals - 08/15/15 2118    PT LONG TERM GOAL #1   Title Ind with an HEP.   Time 4   Period Weeks   Status New   PT LONG TERM GOAL #2   Title left shoulder strength= 5/5.   Time 4   Period Weeks   Status New   PT LONG TERM GOAL #3   Title Negative Impingement test of left shoulder.   Time 4   Period Weeks   Status New   PT LONG TERM GOAL #4   Title Perform ADL's with pain not > 3/10.   Time 4   Period Weeks   Status New               Plan - 08/17/15 0805    Clinical Impression Statement Patient progressing with activities today. Little pain today and has had little pain since she had shot. Able to tolerate exercises with no pain and good technique. HEP given for  strengthening.   Pt will benefit from skilled therapeutic intervention in order to improve on the following deficits Pain;Decreased activity tolerance;Decreased range of motion;Decreased strength   PT Frequency 3x / week   PT Duration 4 weeks   PT Treatment/Interventions ADLs/Self Care Home Management;Cryotherapy;Electrical Stimulation;Moist Heat;Ultrasound;Vasopneumatic Device;Manual techniques;Therapeutic activities;Therapeutic exercise;Passive range of motion   PT Next Visit Plan cont with POC per MPT   Consulted and Agree with Plan of Care Patient        Problem List Patient Active Problem List   Diagnosis Date Noted  . Maxillary sinusitis 08/08/2015  . 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 Health maintenance examination 11/03/2013  . Preventative health care 11/09/2012  . Anxiety and depression 09/08/2011  . GERD (gastroesophageal reflux disease) 09/05/2011  . Overweight (BMI 25.0-29.9) 09/05/2011  . Insomnia 09/05/2011  . Hyperlipidemia   . Migraine     Thang Flett P, PTA 08/17/2015, 8:30 AM  [facility] [address] [place], [place], [address] Phone: [phone]   Fax:  [phone]

## 2015-08-17 NOTE — Patient Instructions (Signed)
  Strengthening: Resisted Flexion   Hold tubing with left arm at side. Pull forward and up. Move shoulder through pain-free range of motion. Repeat __10__ times per set. Do _2-3___ sets per session. Do _2-3___ sessions per day. http://orth.exer.us/824   Copyright  VHI. All rights reserved.  Strengthening: Resisted Extension   Hold tubing in right hand, arm forward. Pull arm back, elbow straight. Repeat __10__ times per set. Do _2-3___ sets per session. Do _2-3___ sessions per day.  http://orth.exer.us/832   Copyright  VHI. All rights reserved.  Strengthening: Resisted Internal Rotation   Hold tubing in left hand, elbow at side and forearm out. Rotate forearm in across body. Repeat __10__ times per set. Do _2-3___ sets per session. Do _2-3___ sessions per day.  http://orth.exer.us/830   Copyright  VHI. All rights reserved.  Strengthening: Resisted External Rotation   Hold tubing in right hand, elbow at side and forearm across body. Rotate forearm out. Repeat __10__ times per set. Do __2-3__ sets per session. Do ____ sessions per day.  http://orth.exer.us/828   Copyright  VHI. All rights reserved.    

## 2015-08-20 ENCOUNTER — Other Ambulatory Visit: Payer: Self-pay | Admitting: Obstetrics and Gynecology

## 2015-08-20 DIAGNOSIS — R928 Other abnormal and inconclusive findings on diagnostic imaging of breast: Secondary | ICD-10-CM

## 2015-08-21 ENCOUNTER — Ambulatory Visit: Payer: BC Managed Care – PPO | Admitting: Physical Therapy

## 2015-08-21 ENCOUNTER — Encounter: Payer: Self-pay | Admitting: Physical Therapy

## 2015-08-21 DIAGNOSIS — M25512 Pain in left shoulder: Secondary | ICD-10-CM

## 2015-08-21 DIAGNOSIS — R29898 Other symptoms and signs involving the musculoskeletal system: Secondary | ICD-10-CM

## 2015-08-21 NOTE — Therapy (Signed)
[facility] [place], [place], [address] Phone: [phone]   Fax:  [phone]  Physical Therapy Treatment  Patient Details  Name: Barbara Munoz MRN: [id number] Date of Birth: 1951-12-18 Referring Provider:  Tammi Sou, MD  Encounter Date: 08/21/2015      PT End of Session - 08/21/15 0845    Visit Number 3   Number of Visits 12   Date for PT Re-Evaluation 09/26/15   PT Start Time 0814   PT Stop Time 0910   PT Time Calculation (min) 56 min   Activity Tolerance Patient tolerated treatment well   Behavior During Therapy [facility] for tasks assessed/performed      Past Medical History  Diagnosis Date  . Hyperlipidemia   . GERD (gastroesophageal reflux disease) 09/05/2011  . Overweight (BMI 25.0-29.9) 09/05/2011  . Migraine     worst during menopause  . History of cyst of breast     multiple drained during menopause  . Insomnia 09/05/2011  . History of Clostridium difficile infection 2007  . Seasonal allergic rhinitis   . Female bladder prolapse   . Anxiety and depression 09/08/2011  . Vertigo, benign positional 09/08/2011    Past Surgical History  Procedure Laterality Date  . Tonsillectomy  82  . Tubal ligation  1983  . Vaginal hernia repair  6-12    rectocele repair  . Cosmetic surgery    . Posterior repair  2012  . Colonoscopy  07/2006    pseudomembranous colitis  . Cardiovascular stress test  02/15/15    Exercise myocard perfusion testing: Normal EF, normal wall motion, normal perfusion.  Poor exercise capacity (4 min).    There were no vitals filed for this visit.  Visit Diagnosis:  Left shoulder pain  Weakness of shoulder      Subjective Assessment - 08/21/15 0820    Subjective little to no pain 0 to 1-2/10 at most with activity   Patient Stated Goals Get out of pain an use my left shouler again.   Currently in Pain? Yes   Pain Score 2    Pain Location Shoulder   Pain Orientation Left   Pain  Descriptors / Indicators Sore   Pain Type Acute pain   Pain Onset More than a month ago   Pain Frequency Intermittent   Aggravating Factors  activity or reach behind back   Pain Relieving Factors rest                         [facility] Adult PT Treatment/Exercise - 08/21/15 0001    Shoulder Exercises: Prone   Retraction Strengthening;Left;Weights  3x10   Retraction Weight (lbs) 2   Extension Strengthening;Left;Weights  3x10   Extension Weight (lbs) 2   Shoulder Exercises: Sidelying   External Rotation Strengthening;Left;Weights  1# 3x10   Shoulder Exercises: Standing   Flexion Strengthening;Left;Weights  3x10   Shoulder Flexion Weight (lbs) 1   Other Standing Exercises RW4 with yellow t-band 3x10 each   Other Standing Exercises wall circles 2xfatigue   Shoulder Exercises: Pulleys   Flexion --  27min   Other Pulley Exercises UE ranger for elevation/circles 2x10 each   Other Pulley Exercises scaption 1# 3x10   Shoulder Exercises: ROM/Strengthening   UBE (Upper Arm Bike) 21min @ 120 RPM                     PT Long Term Goals - 08/21/15 8299  PT LONG TERM GOAL #1   Title Ind with an HEP.   Time 4   Period Weeks   Status Achieved   PT LONG TERM GOAL #2   Title left shoulder strength= 5/5.   Time 4   Period Weeks   Status On-going   PT LONG TERM GOAL #3   Title Negative Impingement test of left shoulder.   Time 4   Period Weeks   Status On-going   PT LONG TERM GOAL #4   Title Perform ADL's with pain not > 3/10.   Time 4   Period Weeks   Status On-going               Plan - 08/21/15 0847    Clinical Impression Statement Patient continues to progress with all activities. Has no pain increase over 2/10 and progressing with shoulder stabilization/strengthening. Patient is independent with HEP today, other goals ongoing due to pain and strength limitations.   Pt will benefit from skilled therapeutic intervention in order to improve on  the following deficits Pain;Decreased activity tolerance;Decreased range of motion;Decreased strength   PT Frequency 3x / week   PT Duration 4 weeks   PT Treatment/Interventions ADLs/Self Care Home Management;Cryotherapy;Electrical Stimulation;Moist Heat;Ultrasound;Vasopneumatic Device;Manual techniques;Therapeutic activities;Therapeutic exercise;Passive range of motion   PT Next Visit Plan cont with POC per MPT   Consulted and Agree with Plan of Care Patient        Problem List Patient Active Problem List   Diagnosis Date Noted  . Maxillary sinusitis 08/08/2015  . 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 Health maintenance examination 11/03/2013  . Preventative health care 11/09/2012  . Anxiety and depression 09/08/2011  . GERD (gastroesophageal reflux disease) 09/05/2011  . Overweight (BMI 25.0-29.9) 09/05/2011  . Insomnia 09/05/2011  . Hyperlipidemia   . Migraine     Buffie Herne P, PTA 08/21/2015, 9:16 AM  [facility] [place], [place], [address] Phone: [phone]   Fax:  [phone]

## 2015-08-24 ENCOUNTER — Ambulatory Visit
Admission: RE | Admit: 2015-08-24 | Discharge: 2015-08-24 | Disposition: A | Discharge status: Home / Self Care | Payer: BC Managed Care – PPO | Source: Ambulatory Visit | Attending: Obstetrics and Gynecology | Admitting: Obstetrics and Gynecology

## 2015-08-24 ENCOUNTER — Ambulatory Visit: Payer: BC Managed Care – PPO | Attending: Orthopedic Surgery | Admitting: Physical Therapy

## 2015-08-24 DIAGNOSIS — M25512 Pain in left shoulder: Secondary | ICD-10-CM | POA: Diagnosis not present

## 2015-08-24 DIAGNOSIS — R928 Other abnormal and inconclusive findings on diagnostic imaging of breast: Secondary | ICD-10-CM

## 2015-08-24 DIAGNOSIS — R29898 Other symptoms and signs involving the musculoskeletal system: Secondary | ICD-10-CM | POA: Insufficient documentation

## 2015-08-24 NOTE — Patient Instructions (Signed)
  Flexibility: Upper Trapezius Stretch   Gently grasp right side of head while reaching behind back with other hand. Tilt head away until a gentle stretch is felt. Hold 30 seconds. Repeat 3 times per set. Do 2 sessions per day.  http://orth.exer.us/340   Levator Stretch   Grasp seat or sit on hand on side to be stretched. Turn head toward other side and look down. Use hand on head to gently stretch neck in that position. Hold _30___ seconds. Repeat on other side. Repeat 3 times. Do 2 sessions per day.  http://gt2.exer.us/30   Scapular Retraction (Standing)   With arms at sides, pinch shoulder blades together. Repeat 10 times per set. Do 1-3 sets per session. Do 2 sessions per day.  http://orth.exer.us/944   DOORWAY STRETCH: Doorway stretch for front of shoulder: Stand facing the door jamb with left elbow bent to 90 degrees, palm on doorjamb. Turn your body to the right until you feel a stretch in the front of the shoulder. Hold 30 seconds. Do 3x. 2-3/day.  Madelyn Flavors, PT 08/24/2015 8:57 AM [facility] [address] [place], [place], [address] Phone: [phone]   Fax:  [phone]

## 2015-08-24 NOTE — Therapy (Signed)
[facility] [place], [place], [address] Phone: [phone]   Fax:  [phone]  Physical Therapy Treatment  Patient Details  Name: KIMBERLYNN LUMBRA MRN: [id number] Date of Birth: [birth year] Referring Provider:  Tammi Sou, MD  Encounter Date: 08/24/2015      PT End of Session - 08/24/15 0819    Visit Number 4   Number of Visits 12   Date for PT Re-Evaluation 09/26/15   PT Start Time 0818   PT Stop Time 0858   PT Time Calculation (min) 40 min   Activity Tolerance Patient tolerated treatment well   Behavior During Therapy [facility] for tasks assessed/performed      Past Medical History  Diagnosis Date  . Hyperlipidemia   . GERD (gastroesophageal reflux disease) 09/05/2011  . Overweight (BMI 25.0-29.9) 09/05/2011  . Migraine     worst during menopause  . History of cyst of breast     multiple drained during menopause  . Insomnia 09/05/2011  . History of Clostridium difficile infection 2007  . Seasonal allergic rhinitis   . Female bladder prolapse   . Anxiety and depression 09/08/2011  . Vertigo, benign positional 09/08/2011    Past Surgical History  Procedure Laterality Date  . Tonsillectomy  82  . Tubal ligation  1983  . Vaginal hernia repair  6-12    rectocele repair  . Cosmetic surgery    . Posterior repair  2012  . Colonoscopy  07/2006    pseudomembranous colitis  . Cardiovascular stress test  02/15/15    Exercise myocard perfusion testing: Normal EF, normal wall motion, normal perfusion.  Poor exercise capacity (4 min).    There were no vitals filed for this visit.  Visit Diagnosis:  Left shoulder pain  Weakness of shoulder      Subjective Assessment - 08/24/15 0820    Subjective Worked out yesterday so a little sore. 1/10.   Currently in Pain? Yes   Pain Score 1    Pain Location Shoulder   Pain Orientation Left   Pain Descriptors / Indicators Sore   Pain Type Acute pain   Pain Onset More than  a month ago   Pain Frequency Intermittent            [facility] PT Assessment - 08/24/15 0001    AROM   Overall AROM Comments Left shoulder flex 137/148 deg; IR to bra strap; ER 55 degrees (supine)                     [facility] Adult PT Treatment/Exercise - 08/24/15 0001    Shoulder Exercises: Seated   External Rotation Strengthening;Both;20 reps;Weights   External Rotation Weight (lbs) 3   Shoulder Exercises: Prone   Retraction Strengthening;Both;20 reps   Retraction Weight (lbs) 0   Horizontal ABduction 1 Weight (lbs) --  attempted but referred pain occurred   Shoulder Exercises: Sidelying   External Rotation --  attempted today but referred pain occurred so held   Shoulder Exercises: Standing   Flexion Strengthening;Left;20 reps;Weights   Shoulder Flexion Weight (lbs) 3   ABduction Strengthening;Both;20 reps;Weights   Shoulder ABduction Weight (lbs) 3   Other Standing Exercises scaption 3# x 20 reps   Other Standing Exercises wall circles 2xfatigue  cw/ccw to fatigue with small ball   Shoulder Exercises: Pulleys   Other Pulley Exercises UE ranger for elevation/circles 2x10 each   Shoulder Exercises: ROM/Strengthening   UBE (Upper Arm Bike) 81min @ 120 RPM  Shoulder Exercises: Stretch   External Rotation Stretch 1 rep;30 seconds   Other Shoulder Stretches Cervical tension stretches UT and levator 2x 30 sec ea.                     PT Long Term Goals - 08/21/15 0846    PT LONG TERM GOAL #1   Title Ind with an HEP.   Time 4   Period Weeks   Status Achieved   PT LONG TERM GOAL #2   Title left shoulder strength= 5/5.   Time 4   Period Weeks   Status On-going   PT LONG TERM GOAL #3   Title Negative Impingement test of left shoulder.   Time 4   Period Weeks   Status On-going   PT LONG TERM GOAL #4   Title Perform ADL's with pain not > 3/10.   Time 4   Period Weeks   Status On-going               Plan - 08/24/15 0902    Clinical  Impression Statement Patient is progressing with left shoulder ROM and has decreased pain overall. She continues to experience referred pain with ER in sidelying. She was able to tolerate increased weights with therex today as well. Held modalities today per patient request as she had to leave early. Will assess response next visit.        Problem List Patient Active Problem List   Diagnosis Date Noted  . Maxillary sinusitis 08/08/2015  . 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 Health maintenance examination 11/03/2013  . Preventative health care 11/09/2012  . Anxiety and depression 09/08/2011  . GERD (gastroesophageal reflux disease) 09/05/2011  . Overweight (BMI 25.0-29.9) 09/05/2011  . Insomnia 09/05/2011  . Hyperlipidemia   . Migraine    Madelyn Flavors PT  08/24/2015, 9:06 AM  [facility] [address] [place], [place], [address] Phone: [phone]   Fax:  [phone]

## 2015-08-30 ENCOUNTER — Encounter: Payer: Self-pay | Admitting: Physical Therapy

## 2015-08-30 ENCOUNTER — Ambulatory Visit: Payer: BC Managed Care – PPO | Admitting: Physical Therapy

## 2015-08-30 DIAGNOSIS — R29898 Other symptoms and signs involving the musculoskeletal system: Secondary | ICD-10-CM

## 2015-08-30 DIAGNOSIS — M25512 Pain in left shoulder: Secondary | ICD-10-CM | POA: Diagnosis not present

## 2015-08-30 NOTE — Therapy (Signed)
[facility] [place], [place], [address] Phone: [phone]   Fax:  [phone]  Physical Therapy Treatment  Patient Details  Name: Barbara Munoz MRN: [id number] Date of Birth: 1951/10/09 Referring Provider:  Tammi Sou, MD  Encounter Date: 08/30/2015      PT End of Session - 08/30/15 9470    Visit Number 5   Number of Visits 12   Date for PT Re-Evaluation 09/26/15   PT Start Time 0819   PT Stop Time 0905   PT Time Calculation (min) 46 min   Activity Tolerance Patient tolerated treatment well   Behavior During Therapy [facility] for tasks assessed/performed      Past Medical History  Diagnosis Date  . Hyperlipidemia   . GERD (gastroesophageal reflux disease) 09/05/2011  . Overweight (BMI 25.0-29.9) 09/05/2011  . Migraine     worst during menopause  . History of cyst of breast     multiple drained during menopause  . Insomnia 09/05/2011  . History of Clostridium difficile infection 2007  . Seasonal allergic rhinitis   . Female bladder prolapse   . Anxiety and depression 09/08/2011  . Vertigo, benign positional 09/08/2011    Past Surgical History  Procedure Laterality Date  . Tonsillectomy  82  . Tubal ligation  1983  . Vaginal hernia repair  6-12    rectocele repair  . Cosmetic surgery    . Posterior repair  2012  . Colonoscopy  07/2006    pseudomembranous colitis  . Cardiovascular stress test  02/15/15    Exercise myocard perfusion testing: Normal EF, normal wall motion, normal perfusion.  Poor exercise capacity (4 min).    There were no vitals filed for this visit.  Visit Diagnosis:  Left shoulder pain  Weakness of shoulder      Subjective Assessment - 08/30/15 0822    Subjective Reports a little soreness today. Picked up rocks this week that weighed approximately 5 lbs and also had grandchildren on Monday.   Patient Stated Goals Get out of pain an use my left shouler again.   Currently in Pain?  Yes   Pain Score 1    Pain Location Shoulder   Pain Orientation Left;Posterior   Pain Descriptors / Indicators Sore   Pain Type Acute pain   Pain Onset More than a month ago            [facility] PT Assessment - 08/30/15 0001    Assessment   Medical Diagnosis Adhesive capsulit of left shoulder                     [facility] Adult PT Treatment/Exercise - 08/30/15 0001    Shoulder Exercises: Prone   Retraction Strengthening;Left;20 reps;Weights   Retraction Weight (lbs) 2   Shoulder Exercises: Sidelying   External Rotation Strengthening;Left;Weights;20 reps   External Rotation Weight (lbs) 3   Shoulder Exercises: Standing   Flexion Strengthening;Both;20 reps;Weights   Shoulder Flexion Weight (lbs) 3   ABduction Strengthening;Both;20 reps;Weights   Shoulder ABduction Weight (lbs) 3   Other Standing Exercises B scaption 3# x20 reps   Other Standing Exercises Wall slides x20 reps 3 sec hold   Shoulder Exercises: Pulleys   Other Pulley Exercises UE ranger for elevation/circles 2x10 each   Shoulder Exercises: ROM/Strengthening   UBE (Upper Arm Bike) 120 RPM x5 min   Shoulder Exercises: Stretch   External Rotation Stretch 3 reps;30 seconds   Modalities   Modalities Electrical Stimulation  Acupuncturist Location Left shoulder   Electrical Stimulation Action IFC   Electrical Stimulation Parameters 1-10 Hz x15 min   Electrical Stimulation Goals Pain                     PT Long Term Goals - 08/30/15 0851    PT LONG TERM GOAL #1   Title Ind with an HEP.   Time 4   Period Weeks   Status Achieved   PT LONG TERM GOAL #2   Title left shoulder strength= 5/5.   Time 4   Period Weeks   Status On-going   PT LONG TERM GOAL #3   Title Negative Impingement test of left shoulder.   Time 4   Period Weeks   Status On-going   PT LONG TERM GOAL #4   Title Perform ADL's with pain not > 3/10.   Time 4   Period Weeks   Status  Achieved               Plan - 08/30/15 0854    Clinical Impression Statement Patient is progressing well with activiities with L shoulder and with exercises. Was able to complete all exercises wtihout complaint of increased pain especially in sidelying ER. Achieved LT goal of completing ADLs with L shoulder pain less than 3/10. Normal stimulation response noted following removal of the stimulation. Reported experiencing "almost nonexistant" pain following end of treatment.     Pt will benefit from skilled therapeutic intervention in order to improve on the following deficits Pain;Decreased activity tolerance;Decreased range of motion;Decreased strength   PT Frequency 3x / week   PT Duration 4 weeks   PT Treatment/Interventions ADLs/Self Care Home Management;Cryotherapy;Electrical Stimulation;Moist Heat;Ultrasound;Vasopneumatic Device;Manual techniques;Therapeutic activities;Therapeutic exercise;Passive range of motion   PT Next Visit Plan cont with POC per MPT   Consulted and Agree with Plan of Care Patient        Problem List Patient Active Problem List   Diagnosis Date Noted  . Maxillary sinusitis 08/08/2015  . 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 Health maintenance examination 11/03/2013  . Preventative health care 11/09/2012  . Anxiety and depression 09/08/2011  . GERD (gastroesophageal reflux disease) 09/05/2011  . Overweight (BMI 25.0-29.9) 09/05/2011  . Insomnia 09/05/2011  . Hyperlipidemia   . Migraine     Wynelle Fanny, PTA 08/30/2015, 9:51 AM  [facility] [address] [place], [place], [address] Phone: [phone]   Fax:  [phone]

## 2015-09-06 ENCOUNTER — Encounter: Payer: BC Managed Care – PPO | Admitting: *Deleted

## 2015-09-26 NOTE — Therapy (Signed)
[facility] [place], [place], [address] Phone: [phone]   Fax:  [phone]  Physical Therapy Treatment  Patient Details  Name: Barbara Munoz MRN: [id number] Date of Birth: 1951/11/15 Referring Provider:  Tammi Sou, MD  Encounter Date: 08/30/2015    Past Medical History  Diagnosis Date  . Hyperlipidemia   . GERD (gastroesophageal reflux disease) 09/05/2011  . Overweight (BMI 25.0-29.9) 09/05/2011  . Migraine     worst during menopause  . History of cyst of breast     multiple drained during menopause  . Insomnia 09/05/2011  . History of Clostridium difficile infection 2007  . Seasonal allergic rhinitis   . Female bladder prolapse   . Anxiety and depression 09/08/2011  . Vertigo, benign positional 09/08/2011    Past Surgical History  Procedure Laterality Date  . Tonsillectomy  82  . Tubal ligation  1983  . Vaginal hernia repair  6-12    rectocele repair  . Cosmetic surgery    . Posterior repair  2012  . Colonoscopy  07/2006    pseudomembranous colitis  . Cardiovascular stress test  02/15/15    Exercise myocard perfusion testing: Normal EF, normal wall motion, normal perfusion.  Poor exercise capacity (4 min).    There were no vitals filed for this visit.  Visit Diagnosis:  Left shoulder pain  Weakness of shoulder                                    PT Long Term Goals - 08/30/15 0851    PT LONG TERM GOAL #1   Title Ind with an HEP.   Time 4   Period Weeks   Status Achieved   PT LONG TERM GOAL #2   Title left shoulder strength= 5/5.   Time 4   Period Weeks   Status On-going   PT LONG TERM GOAL #3   Title Negative Impingement test of left shoulder.   Time 4   Period Weeks   Status On-going   PT LONG TERM GOAL #4   Title Perform ADL's with pain not > 3/10.   Time 4   Period Weeks   Status Achieved               Problem List Patient Active  Problem List   Diagnosis Date Noted  . Maxillary sinusitis 08/08/2015  . 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 Health maintenance examination 11/03/2013  . Preventative health care 11/09/2012  . Anxiety and depression 09/08/2011  . GERD (gastroesophageal reflux disease) 09/05/2011  . Overweight (BMI 25.0-29.9) 09/05/2011  . Insomnia 09/05/2011  . Hyperlipidemia   . Migraine    PHYSICAL THERAPY DISCHARGE SUMMARY  Visits from Start of Care: 5  Current functional level related to goals / functional outcomes: Please see above.   Remaining deficits: Some remaining left shoulder pain but patient pleased with progress.   Education / Equipment: HEP.  Plan: Patient agrees to discharge.  Patient goals were partially met. Patient is being discharged due to meeting the stated rehab goals.  ?????      [place], [place] MPT 09/26/2015, 1:47 PM  [address] [facility] [address] [place], [place], [address] Phone: [phone]   Fax:  [phone]

## 2015-10-11 ENCOUNTER — Other Ambulatory Visit: Payer: Self-pay | Admitting: Obstetrics and Gynecology

## 2015-10-11 ENCOUNTER — Ambulatory Visit (INDEPENDENT_AMBULATORY_CARE_PROVIDER_SITE_OTHER): Payer: BC Managed Care – PPO

## 2015-10-11 DIAGNOSIS — Z23 Encounter for immunization: Secondary | ICD-10-CM | POA: Diagnosis not present

## 2015-10-11 DIAGNOSIS — N6310 Unspecified lump in the right breast, unspecified quadrant: Secondary | ICD-10-CM

## 2015-10-17 ENCOUNTER — Encounter: Payer: Self-pay | Admitting: Family Medicine

## 2015-10-17 ENCOUNTER — Ambulatory Visit (INDEPENDENT_AMBULATORY_CARE_PROVIDER_SITE_OTHER): Payer: BC Managed Care – PPO | Admitting: Family Medicine

## 2015-10-17 VITALS — BP 107/72 | HR 67 | Temp 98.0°F | Resp 16 | Ht 63.0 in | Wt 150.0 lb

## 2015-10-17 DIAGNOSIS — H6501 Acute serous otitis media, right ear: Secondary | ICD-10-CM

## 2015-10-17 DIAGNOSIS — H6981 Other specified disorders of Eustachian tube, right ear: Secondary | ICD-10-CM | POA: Diagnosis not present

## 2015-10-17 MED ORDER — AMOXICILLIN 875 MG PO TABS: 875.0000 mg | ORAL_TABLET | Freq: Two times a day (BID) | ORAL | Status: AC

## 2015-10-17 NOTE — Progress Notes (Signed)
OFFICE NOTE  10/21/2015  CC:  Chief Complaint  Patient presents with  . Ear Pain    right ear x 1 month   HPI: Patient is a 64 y.o. Caucasian female who is here for right ear pain, on and off, sometimes helped at times by otc sudafed and mucinex.  Pain a bit improved last few days.  Feels/hears a sloshing noise. Also with some gravely voice at times lately plus occ dry cough.  No itching.  No fevers or ear drainage.  Pertinent PMH:  Past medical, surgical, social, and family history reviewed and no changes are noted since last office visit.  Not taking flonase or claritin lately. Not taking augmentin listed below. MEDS:  Outpatient Prescriptions Prior to Visit  Medication Sig Dispense Refill  . acetaminophen (TYLENOL) 500 MG tablet Take 500 mg by mouth every 6 (six) hours as needed.      [name] Kitchen albuterol (PROVENTIL HFA;VENTOLIN HFA) 108 (90 BASE) MCG/ACT inhaler Inhale 2 puffs into the lungs every 6 (six) hours as needed for wheezing or shortness of breath. 1 Inhaler 2  . ALPRAZolam (XANAX) 0.25 MG tablet Take 1 tablet (0.25 mg total) by mouth 2 (two) times daily as needed for anxiety. 180 tablet 1  . aspirin 81 MG tablet Take 81 mg by mouth daily.      . Calcium Carbonate-Vitamin D (CALCIUM 600+D) 600-400 MG-UNIT per tablet Take 1 tablet by mouth daily.      . fluticasone (FLONASE) 50 MCG/ACT nasal spray Place 2 sprays into both nostrils daily. 16 g 1  . loratadine (CLARITIN) 10 MG tablet Take 10 mg by mouth daily as needed. Spring and fall     . Multiple Vitamin (MULTIVITAMIN) tablet Take 1 tablet by mouth daily.      . Omega-3 Fatty Acids (FISH OIL PO) Take by mouth daily.    . ranitidine (ZANTAC) 150 MG tablet Take 150 mg by mouth daily as needed.     . sertraline (ZOLOFT) 100 MG tablet Take 1.5 tabs daily 135 tablet 3  . simvastatin (ZOCOR) 40 MG tablet Take 1 tablet (40 mg total) by mouth at bedtime. 90 tablet 3  . amoxicillin-clavulanate (AUGMENTIN) 875-125 MG per tablet Take 1  tablet by mouth 2 (two) times daily. (Patient not taking: Reported on 10/17/2015) 20 tablet 0   No facility-administered medications prior to visit.    PE: Blood pressure 107/72, pulse 67, temperature 98 F (36.7 C), temperature source Oral, resp. rate 16, height 5\' 3"  (1.6 m), weight 150 lb (68.04 kg), SpO2 97 %. Gen: Alert, well appearing.  Patient is oriented to person, place, time, and situation. ENT: eyes: no drainage or erythema. Ears: no erythema or tenderness of external ear anatomy on either side.  EACs with mild amount of normal cerumen. L TM with good light reflex and landmarks, no fluid in middle ear. R TM slightly distended and some clear middle ear fluid is noted.  No TM erythema.  +Normal R TM light reflex/landmarks. Mouth: lips without lesion/swelling.  Oral mucosa pink and moist. Oropharynx without erythema, exudate, or swelling.  CV: RRR, no m/r/g.   LUNGS: CTA bilat, nonlabored resps, good aeration in all lung fields.   IMPRESSION AND PLAN:  R acute serous OM, eustachian tube dysfunction:  Instructions: Restart daiily flonase 2 sprays each nostril in mornings. Use otc generic saline nasal spray as needed. Use otc generic afrin nasal spray: 2 sprays R nostril every 12 h as needed : max of 3 consecutive  days. Rx for amoxil 875 bid x 7d was given to pt today to hold and fill if not feeling improved R ear in 2-3 days, earlier if worse. Signs/symptoms to call or return for were reviewed and pt expressed understanding.  An After Visit Summary was printed and given to the patient.  FOLLOW UP: prn

## 2015-10-17 NOTE — Patient Instructions (Signed)
Restart daiily flonase 2 sprays each nostril in mornings. Use otc generic saline nasal spray as needed. Use otc generic afrin nasal spray: 2 sprays R nostril every 12 h as needed : max of 3 consecutive days.

## 2015-10-17 NOTE — Progress Notes (Signed)
Pre visit review using our clinic review tool, if applicable. No additional management support is needed unless otherwise documented below in the visit note. 

## 2015-10-18 ENCOUNTER — Other Ambulatory Visit: Payer: BC Managed Care – PPO

## 2015-11-21 ENCOUNTER — Other Ambulatory Visit: Payer: Self-pay | Admitting: Family Medicine

## 2015-11-22 ENCOUNTER — Ambulatory Visit: Payer: BC Managed Care – PPO | Admitting: Family Medicine

## 2015-11-23 ENCOUNTER — Encounter: Payer: Self-pay | Admitting: Family Medicine

## 2015-11-23 ENCOUNTER — Ambulatory Visit (INDEPENDENT_AMBULATORY_CARE_PROVIDER_SITE_OTHER): Payer: BC Managed Care – PPO | Admitting: Family Medicine

## 2015-11-23 VITALS — BP 108/72 | HR 66 | Temp 98.2°F | Resp 16 | Wt 152.0 lb

## 2015-11-23 DIAGNOSIS — J309 Allergic rhinitis, unspecified: Secondary | ICD-10-CM | POA: Diagnosis not present

## 2015-11-23 DIAGNOSIS — F32A Depression, unspecified: Secondary | ICD-10-CM

## 2015-11-23 DIAGNOSIS — F419 Anxiety disorder, unspecified: Secondary | ICD-10-CM

## 2015-11-23 DIAGNOSIS — E785 Hyperlipidemia, unspecified: Secondary | ICD-10-CM

## 2015-11-23 DIAGNOSIS — F418 Other specified anxiety disorders: Secondary | ICD-10-CM

## 2015-11-23 DIAGNOSIS — M7582 Other shoulder lesions, left shoulder: Secondary | ICD-10-CM

## 2015-11-23 DIAGNOSIS — F329 Major depressive disorder, single episode, unspecified: Secondary | ICD-10-CM

## 2015-11-23 LAB — COMPREHENSIVE METABOLIC PANEL
ALBUMIN: 4.2 g/dL (ref 3.5–5.2)
ALT: 23 U/L (ref 0–35)
AST: 23 U/L (ref 0–37)
Alkaline Phosphatase: 65 U/L (ref 39–117)
BUN: 17 mg/dL (ref 6–23)
CHLORIDE: 108 meq/L (ref 96–112)
CO2: 26 mEq/L (ref 19–32)
CREATININE: 0.69 mg/dL (ref 0.40–1.20)
Calcium: 9.5 mg/dL (ref 8.4–10.5)
GFR: 91.04 mL/min (ref >60.00)
GLUCOSE: 90 mg/dL (ref 70–99)
POTASSIUM: 4.5 meq/L (ref 3.5–5.1)
SODIUM: 142 meq/L (ref 135–145)
TOTAL PROTEIN: 7.1 g/dL (ref 6.0–8.3)
Total Bilirubin: 0.3 mg/dL (ref 0.2–1.2)

## 2015-11-23 LAB — LIPID PANEL
CHOL/HDL RATIO: 3
Cholesterol: 164 mg/dL (ref 0–200)
HDL: 47.7 mg/dL (ref >39.00)
LDL CALC: 93 mg/dL (ref 0–99)
NONHDL: 116.64
Triglycerides: 119 mg/dL (ref 0.0–149.0)
VLDL: 23.8 mg/dL (ref 0.0–40.0)

## 2015-11-23 NOTE — Progress Notes (Signed)
OFFICE NOTE  11/23/2015  CC:  Chief Complaint  Patient presents with  . Follow-up    Pt is fasting.    HPI: Patient is a 63 y.o. Caucasian female who is here for 5 mo med follow up. Anxiety/dep: sertraline helping, mood and anxiety level stable.  Not requiring alprazolam much at all. Sometimes uses it for insomnia.  Allergies: R ear eustachian tube sx's nearly completely resolved.  Flonase and claritin helping.  Left shoulder: my injection last visit helped but sx's returned about a month later and she got another steroid injection by her ortho, Dr. Onnie Graham.   Recently her RC tendonitis sx's returned and she is considering going back to her ortho for consideration of another injection vs PT.  Chol: tolerating statin, trying to eat a heart-healthy diet.  ROS: no CP, SOB, palpitations, dizziness, or LE edema.   Pertinent PMH:  Past Medical History  Diagnosis Date  . Hyperlipidemia   . GERD (gastroesophageal reflux disease) 09/05/2011  . Overweight (BMI 25.0-29.9) 09/05/2011  . Migraine     worst during menopause  . History of cyst of breast     multiple drained during menopause  . Insomnia 09/05/2011  . History of Clostridium difficile infection 2007  . Seasonal allergic rhinitis   . Female bladder prolapse   . Anxiety and depression 09/08/2011  . Vertigo, benign positional 09/08/2011   Past Surgical History  Procedure Laterality Date  . Tonsillectomy  82  . Tubal ligation  1983  . Vaginal hernia repair  6-12    rectocele repair  . Cosmetic surgery    . Posterior repair  2012  . Colonoscopy  07/2006    pseudomembranous colitis  . Cardiovascular stress test  02/15/15    Exercise myocard perfusion testing: Normal EF, normal wall motion, normal perfusion.  Poor exercise capacity (4 min).    MEDS:  Outpatient Prescriptions Prior to Visit  Medication Sig Dispense Refill  . acetaminophen (TYLENOL) 500 MG tablet Take 500 mg by mouth every 6 (six) hours as needed.      [name] Kitchen  albuterol (PROVENTIL HFA;VENTOLIN HFA) 108 (90 BASE) MCG/ACT inhaler Inhale 2 puffs into the lungs every 6 (six) hours as needed for wheezing or shortness of breath. 1 Inhaler 2  . ALPRAZolam (XANAX) 0.25 MG tablet Take 1 tablet (0.25 mg total) by mouth 2 (two) times daily as needed for anxiety. 180 tablet 1  . aspirin 81 MG tablet Take 81 mg by mouth daily.      . Calcium Carbonate-Vitamin D (CALCIUM 600+D) 600-400 MG-UNIT per tablet Take 1 tablet by mouth daily.      . fluticasone (FLONASE) 50 MCG/ACT nasal spray Place 2 sprays into both nostrils daily. 16 g 1  . loratadine (CLARITIN) 10 MG tablet Take 10 mg by mouth daily as needed. Spring and fall     . Multiple Vitamin (MULTIVITAMIN) tablet Take 1 tablet by mouth daily.      . Omega-3 Fatty Acids (FISH OIL PO) Take by mouth daily.    . ranitidine (ZANTAC) 150 MG tablet Take 150 mg by mouth daily as needed.     . sertraline (ZOLOFT) 100 MG tablet TAKE 1 & 1/2 TABLETS BY MOUTH EVERY DAY 135 tablet 2  . simvastatin (ZOCOR) 40 MG tablet TAKE 1 TABLET (40 MG TOTAL) BY MOUTH AT BEDTIME. 90 tablet 2   No facility-administered medications prior to visit.    PE: Blood pressure 108/72, pulse 66, temperature 98.2 F (36.8 C), temperature source  Oral, resp. rate 16, weight 152 lb (68.947 kg), SpO2 96 %. [id number]: no injection, icteris, swelling, or exudate.  EOMI, PERRLA. Mouth: lips without lesion/swelling.  Oral mucosa pink and moist. Oropharynx without erythema, exudate, or swelling.  EARS: TMs normal. CV: RRR, no m/r/g.   LUNGS: CTA bilat, nonlabored resps, good aeration in all lung fields. EXT: no clubbing, cyanosis, or edema.    LABS:  Lab Results  Component Value Date   CHOL 160 05/17/2014   HDL 42.40 05/17/2014   LDLCALC 88 05/17/2014   LDLDIRECT 110.6 10/26/2013   TRIG 148.0 05/17/2014   CHOLHDL 4 05/17/2014    IMPRESSION AND PLAN:  1) Hyperlipidemia; tolerating statin.  Will check FLP and AST/ALT today as well as  lytes/cr.  2) Allergic rhinitis with eustachian tube dysfunction: improved/stable.  Continue current meds.  3) Anxiety/depression: The current medical regimen is effective;  continue present plan and medications.  4) L shoulder pain: rotator cuff tendonitis-recurrent.  She will contact her orthopedist about possible repeat steroid injection vs PT at his office.  An After Visit Summary was printed and given to the patient.   FOLLOW UP: 6 mo for CPE.

## 2015-11-23 NOTE — Progress Notes (Signed)
Pre visit review using our clinic review tool, if applicable. No additional management support is needed unless otherwise documented below in the visit note. 

## 2015-11-26 ENCOUNTER — Encounter: Payer: Self-pay | Admitting: Family Medicine

## 2016-06-06 ENCOUNTER — Other Ambulatory Visit: Payer: Self-pay | Admitting: Family Medicine

## 2016-06-06 DIAGNOSIS — Z0000 Encounter for general adult medical examination without abnormal findings: Secondary | ICD-10-CM

## 2016-06-20 ENCOUNTER — Other Ambulatory Visit (INDEPENDENT_AMBULATORY_CARE_PROVIDER_SITE_OTHER): Payer: BC Managed Care – PPO

## 2016-06-20 DIAGNOSIS — Z0000 Encounter for general adult medical examination without abnormal findings: Secondary | ICD-10-CM

## 2016-06-20 LAB — CBC WITH DIFFERENTIAL/PLATELET
BASOS PCT: 0.5 % (ref 0.0–3.0)
Basophils Absolute: 0 10*3/uL (ref 0.0–0.1)
EOS PCT: 2.7 % (ref 0.0–5.0)
Eosinophils Absolute: 0.2 10*3/uL (ref 0.0–0.7)
HCT: 41.1 % (ref 36.0–46.0)
Hemoglobin: 13.6 g/dL (ref 12.0–15.0)
LYMPHS ABS: 3.3 10*3/uL (ref 0.7–4.0)
Lymphocytes Relative: 48.7 % — ABNORMAL HIGH (ref 12.0–46.0)
MCHC: 33 g/dL (ref 30.0–36.0)
MCV: 88.4 fl (ref 78.0–100.0)
MONO ABS: 0.5 10*3/uL (ref 0.1–1.0)
MONOS PCT: 8.2 % (ref 3.0–12.0)
NEUTROS ABS: 2.7 10*3/uL (ref 1.4–7.7)
Neutrophils Relative %: 39.9 % — ABNORMAL LOW (ref 43.0–77.0)
PLATELETS: 181 10*3/uL (ref 150.0–400.0)
RBC: 4.65 Mil/uL (ref 3.87–5.11)
RDW: 15.6 % — ABNORMAL HIGH (ref 11.5–15.5)
WBC: 6.7 10*3/uL (ref 4.0–10.5)

## 2016-06-20 LAB — LIPID PANEL
CHOL/HDL RATIO: 4
Cholesterol: 190 mg/dL (ref 0–200)
HDL: 49 mg/dL (ref >39.00)
LDL CALC: 111 mg/dL — AB (ref 0–99)
NonHDL: 140.88
TRIGLYCERIDES: 148 mg/dL (ref 0.0–149.0)
VLDL: 29.6 mg/dL (ref 0.0–40.0)

## 2016-06-20 LAB — COMPREHENSIVE METABOLIC PANEL
ALBUMIN: 4.2 g/dL (ref 3.5–5.2)
ALT: 22 U/L (ref 0–35)
AST: 22 U/L (ref 0–37)
Alkaline Phosphatase: 55 U/L (ref 39–117)
BUN: 19 mg/dL (ref 6–23)
CALCIUM: 9.3 mg/dL (ref 8.4–10.5)
CHLORIDE: 107 meq/L (ref 96–112)
CO2: 31 meq/L (ref 19–32)
Creatinine, Ser: 0.67 mg/dL (ref 0.40–1.20)
GFR: 94.01 mL/min (ref >60.00)
Glucose, Bld: 89 mg/dL (ref 70–99)
POTASSIUM: 4.1 meq/L (ref 3.5–5.1)
Sodium: 141 mEq/L (ref 135–145)
Total Bilirubin: 0.4 mg/dL (ref 0.2–1.2)
Total Protein: 6.9 g/dL (ref 6.0–8.3)

## 2016-06-20 LAB — TSH: TSH: 1.28 u[IU]/mL (ref 0.35–4.50)

## 2016-06-21 ENCOUNTER — Encounter: Payer: Self-pay | Admitting: Family Medicine

## 2016-06-23 ENCOUNTER — Other Ambulatory Visit: Payer: BC Managed Care – PPO

## 2016-06-26 ENCOUNTER — Encounter: Payer: Self-pay | Admitting: Family Medicine

## 2016-06-26 ENCOUNTER — Ambulatory Visit (INDEPENDENT_AMBULATORY_CARE_PROVIDER_SITE_OTHER): Payer: BC Managed Care – PPO | Admitting: Family Medicine

## 2016-06-26 VITALS — BP 116/73 | HR 73 | Temp 97.7°F | Resp 16 | Ht 63.5 in | Wt 162.8 lb

## 2016-06-26 DIAGNOSIS — Z23 Encounter for immunization: Secondary | ICD-10-CM

## 2016-06-26 DIAGNOSIS — Z1211 Encounter for screening for malignant neoplasm of colon: Secondary | ICD-10-CM

## 2016-06-26 DIAGNOSIS — Z0000 Encounter for general adult medical examination without abnormal findings: Secondary | ICD-10-CM

## 2016-06-26 MED ORDER — SIMVASTATIN 40 MG PO TABS: ORAL_TABLET | ORAL | Status: DC

## 2016-06-26 MED ORDER — SERTRALINE HCL 100 MG PO TABS: 150.0000 mg | ORAL_TABLET | Freq: Every day | ORAL | Status: DC

## 2016-06-26 NOTE — Progress Notes (Signed)
Pre visit review using our clinic review tool, if applicable. No additional management support is needed unless otherwise documented below in the visit note. 

## 2016-06-26 NOTE — Addendum Note (Signed)
Addended by: Onalee Hua on: 06/26/2016 01:47 PM   Modules accepted: Orders, SmartSet

## 2016-06-26 NOTE — Progress Notes (Signed)
Office Note 06/26/2016  CC:  Chief Complaint  Patient presents with  . Annual Exam    Labs done on 06/20/16    HPI:  Barbara Munoz is a 65 y.o. White female who is here for annual health maintenance exam. Dr. Cletis Media is her GYN MD.  UTD on cerv ca scr, breast ca scr, and DEXA.  She has plans to do annual f/u there in the fall this year.  Reviewed recent fasting labs in detail with pt today.  Has been utilizing nurse coaches for exercise, anx/dep, TLC.  This was pretty intense and she burned herself out.  At that point she stopped ALL the TLC she had been doing, regained wt.  She has remained on her zocor.   She is now restarting TLC in moderation.  Feels relatively stable regarding her depression and anxiety. Eye exam 02/2016--all was good.  No acute complaints.   Past Medical History  Diagnosis Date  . GERD (gastroesophageal reflux disease) 09/05/2011  . Overweight (BMI 25.0-29.9) 09/05/2011  . Migraine     worst during menopause  . History of cyst of breast     multiple drained during menopause  . Insomnia 09/05/2011  . History of Clostridium difficile infection 2007  . Seasonal allergic rhinitis   . Female bladder prolapse   . Anxiety and depression 09/08/2011  . Vertigo, benign positional 09/08/2011  . Hyperlipidemia     Past Surgical History  Procedure Laterality Date  . Tonsillectomy  82  . Tubal ligation  1983  . Vaginal hernia repair  6-12    rectocele repair  . Cosmetic surgery    . Posterior repair  2012  . Colonoscopy  07/2006    pseudomembranous colitis  . Cardiovascular stress test  02/15/15    Exercise myocard perfusion testing: Normal EF, normal wall motion, normal perfusion.  Poor exercise capacity (4 min).  . Dexa  06/19/09    Normal    Family History  Problem Relation Age of Onset  . Diabetes Mother 57    type 2  . Aneurysm Father     aortic  . Cancer Father     prostate and lung/ smoker  . Pernicious anemia Father   . Hyperlipidemia Sister    . Hypertension Sister   . Diabetes Sister     type 2  . Other Sister     degenerative disc disease  . Thyroid disease Sister   . Cancer Sister     breast  cancer s/p dbl mastectomy doing well  . Hyperlipidemia Brother   . Allergies Brother   . Diabetes Daughter     type 1  . Breast cancer Maternal Grandmother   . Heart attack Maternal Grandfather   . Heart disease Paternal Grandmother     CHF  . Pernicious anemia Paternal Grandmother   . Kidney disease Paternal Grandfather   . Stroke Paternal Grandfather   . Hypertension Sister   . Hyperlipidemia Sister   . Diabetes Sister     type 2  . Allergies Daughter   . Breast cancer Sister     Social History   Social History  . Marital Status: Married    Spouse Name: N/A  . Number of Children: N/A  . Years of Education: N/A   Occupational History  . Not on file.   Social History Main Topics  . Smoking status: Never Smoker   . Smokeless tobacco: Never Used     Comment: smoked for about 6 months  .  Alcohol Use: No     Comment: rarely  . Drug Use: No  . Sexual Activity: Yes    Birth Control/ Protection: Post-menopausal   Other Topics Concern  . Not on file   Social History Narrative   Married.   Nonsmoker.    Outpatient Prescriptions Prior to Visit  Medication Sig Dispense Refill  . acetaminophen (TYLENOL) 500 MG tablet Take 500 mg by mouth every 6 (six) hours as needed.      [name] Kitchen albuterol (PROVENTIL HFA;VENTOLIN HFA) 108 (90 BASE) MCG/ACT inhaler Inhale 2 puffs into the lungs every 6 (six) hours as needed for wheezing or shortness of breath. 1 Inhaler 2  . ALPRAZolam (XANAX) 0.25 MG tablet Take 1 tablet (0.25 mg total) by mouth 2 (two) times daily as needed for anxiety. 180 tablet 1  . aspirin 81 MG tablet Take 81 mg by mouth daily.      . Calcium Carbonate-Vitamin D (CALCIUM 600+D) 600-400 MG-UNIT per tablet Take 1 tablet by mouth daily.      . fluticasone (FLONASE) 50 MCG/ACT nasal spray Place 2 sprays into both  nostrils daily. 16 g 1  . loratadine (CLARITIN) 10 MG tablet Take 10 mg by mouth daily as needed. Spring and fall     . Multiple Vitamin (MULTIVITAMIN) tablet Take 1 tablet by mouth daily.      . Omega-3 Fatty Acids (FISH OIL PO) Take by mouth daily.    . ranitidine (ZANTAC) 150 MG tablet Take 150 mg by mouth daily as needed.     . sertraline (ZOLOFT) 100 MG tablet TAKE 1 & 1/2 TABLETS BY MOUTH EVERY DAY 135 tablet 2  . simvastatin (ZOCOR) 40 MG tablet TAKE 1 TABLET (40 MG TOTAL) BY MOUTH AT BEDTIME. 90 tablet 2   No facility-administered medications prior to visit.    No Known Allergies  ROS Review of Systems  Constitutional: Negative for fever, chills, appetite change and fatigue.  HENT: Negative for congestion, dental problem, ear pain and sore throat.   Eyes: Negative for discharge, redness and visual disturbance.  Respiratory: Negative for cough, chest tightness, shortness of breath and wheezing.   Cardiovascular: Negative for chest pain, palpitations and leg swelling.  Gastrointestinal: Negative for nausea, vomiting, abdominal pain, diarrhea and blood in stool.  Genitourinary: Negative for dysuria, urgency, frequency, hematuria, flank pain and difficulty urinating.  Musculoskeletal: Negative for myalgias, back pain, joint swelling, arthralgias and neck stiffness.  Skin: Negative for pallor and rash.  Neurological: Negative for dizziness, speech difficulty, weakness and headaches.  Hematological: Negative for adenopathy. Does not bruise/bleed easily.  Psychiatric/Behavioral: Negative for confusion and sleep disturbance. The patient is not nervous/anxious.     PE; Blood pressure 116/73, pulse 73, temperature 97.7 F (36.5 C), temperature source Oral, resp. rate 16, height 5' 3.5" (1.613 m), weight 162 lb 12 oz (73.823 kg), SpO2 95 %. Gen: Alert, well appearing.  Patient is oriented to person, place, time, and situation. AFFECT: pleasant, lucid thought and speech. ENT: Ears: EACs  clear, normal epithelium.  TMs with good light reflex and landmarks bilaterally.  Eyes: no injection, icteris, swelling, or exudate.  EOMI, PERRLA. Nose: no drainage or turbinate edema/swelling.  No injection or focal lesion.  Mouth: lips without lesion/swelling.  Oral mucosa pink and moist.  Dentition intact and without obvious caries or gingival swelling.  Oropharynx without erythema, exudate, or swelling.  Neck: supple/nontender.  No LAD, mass, or TM.  Carotid pulses 2+ bilaterally, without bruits. CV: RRR, no m/r/g.  LUNGS: CTA bilat, nonlabored resps, good aeration in all lung fields. ABD: soft, NT, ND, BS normal.  No hepatospenomegaly or mass.  No bruits. EXT: no clubbing, cyanosis, or edema.  Musculoskeletal: no joint swelling, erythema, warmth, or tenderness.  ROM of all joints intact. Skin - no sores or suspicious lesions or rashes or color changes   Pertinent labs:  Lab Results  Component Value Date   TSH 1.28 06/20/2016   Lab Results  Component Value Date   WBC 6.7 06/20/2016   HGB 13.6 06/20/2016   HCT 41.1 06/20/2016   MCV 88.4 06/20/2016   PLT 181.0 06/20/2016   Lab Results  Component Value Date   CREATININE 0.67 06/20/2016   BUN 19 06/20/2016   NA 141 06/20/2016   K 4.1 06/20/2016   CL 107 06/20/2016   CO2 31 06/20/2016   Lab Results  Component Value Date   ALT 22 06/20/2016   AST 22 06/20/2016   ALKPHOS 55 06/20/2016   BILITOT 0.4 06/20/2016   Lab Results  Component Value Date   CHOL 190 06/20/2016   Lab Results  Component Value Date   HDL 49.00 06/20/2016   Lab Results  Component Value Date   LDLCALC 111* 06/20/2016   Lab Results  Component Value Date   TRIG 148.0 06/20/2016   Lab Results  Component Value Date   CHOLHDL 4 06/20/2016    ASSESSMENT AND PLAN:   Health maintenance exam: Reviewed age and gender appropriate health maintenance issues (prudent diet, regular exercise, health risks of tobacco and excessive alcohol, use of  seatbelts, fire alarms in home, use of sunscreen).  Also reviewed age and gender appropriate health screening as well as vaccine recommendations. Reviewed HP labs in detail with pt--all good except mild increase in her cholesterol panel numbers----can be explained by her lapse in TLC earlier this year. Colon ca screening: refer to Dr. Ardis Hughs with [facility]---her last colonoscopy was 10 yrs ago. Cervical and breast ca screening: UTD via her GYN MD. Tdap today.  An After Visit Summary was printed and given to the patient.  FOLLOW UP:  Return in about 6 months (around 12/27/2016) for routine chronic illness f/u.  Signed:  Crissie Sickles, MD           06/26/2016

## 2016-07-21 ENCOUNTER — Encounter: Payer: Self-pay | Admitting: Family Medicine

## 2016-07-21 ENCOUNTER — Ambulatory Visit (INDEPENDENT_AMBULATORY_CARE_PROVIDER_SITE_OTHER): Payer: BC Managed Care – PPO | Admitting: Family Medicine

## 2016-07-21 VITALS — BP 107/71 | HR 76 | Temp 99.3°F | Resp 20 | Wt 162.0 lb

## 2016-07-21 DIAGNOSIS — J0100 Acute maxillary sinusitis, unspecified: Secondary | ICD-10-CM

## 2016-07-21 MED ORDER — AMOXICILLIN-POT CLAVULANATE 875-125 MG PO TABS: 1.0000 | ORAL_TABLET | Freq: Two times a day (BID) | ORAL | 0 refills | Status: DC

## 2016-07-21 NOTE — Patient Instructions (Signed)
Plain mucinex or mucinex DM (with robitussin). Called Augmentin. Hydration and rest.  Nasal saline and continue antihistamine.    Sinusitis, Adult Sinusitis is redness, soreness, and inflammation of the paranasal sinuses. Paranasal sinuses are air pockets within the bones of your face. They are located beneath your eyes, in the middle of your forehead, and above your eyes. In healthy paranasal sinuses, mucus is able to drain out, and air is able to circulate through them by way of your nose. However, when your paranasal sinuses are inflamed, mucus and air can become trapped. This can allow bacteria and other germs to grow and cause infection. Sinusitis can develop quickly and last only a short time (acute) or continue over a long period (chronic). Sinusitis that lasts for more than 12 weeks is considered chronic. CAUSES Causes of sinusitis include:  Allergies.  Structural abnormalities, such as displacement of the cartilage that separates your nostrils (deviated septum), which can decrease the air flow through your nose and sinuses and affect sinus drainage.  Functional abnormalities, such as when the small hairs (cilia) that line your sinuses and help remove mucus do not work properly or are not present. SIGNS AND SYMPTOMS Symptoms of acute and chronic sinusitis are the same. The primary symptoms are pain and pressure around the affected sinuses. Other symptoms include:  Upper toothache.  Earache.  Headache.  Bad breath.  Decreased sense of smell and taste.  A cough, which worsens when you are lying flat.  Fatigue.  Fever.  Thick drainage from your nose, which often is green and may contain pus (purulent).  Swelling and warmth over the affected sinuses. DIAGNOSIS Your health care provider will perform a physical exam. During your exam, your health care provider may perform any of the following to help determine if you have acute sinusitis or chronic sinusitis:  Look in your  nose for signs of abnormal growths in your nostrils (nasal polyps).  Tap over the affected sinus to check for signs of infection.  View the inside of your sinuses using an imaging device that has a light attached (endoscope). If your health care provider suspects that you have chronic sinusitis, one or more of the following tests may be recommended:  Allergy tests.  Nasal culture. A sample of mucus is taken from your nose, sent to a lab, and screened for bacteria.  Nasal cytology. A sample of mucus is taken from your nose and examined by your health care provider to determine if your sinusitis is related to an allergy. TREATMENT Most cases of acute sinusitis are related to a viral infection and will resolve on their own within 10 days. Sometimes, medicines are prescribed to help relieve symptoms of both acute and chronic sinusitis. These may include pain medicines, decongestants, nasal steroid sprays, or saline sprays. However, for sinusitis related to a bacterial infection, your health care provider will prescribe antibiotic medicines. These are medicines that will help kill the bacteria causing the infection. Rarely, sinusitis is caused by a fungal infection. In these cases, your health care provider will prescribe antifungal medicine. For some cases of chronic sinusitis, surgery is needed. Generally, these are cases in which sinusitis recurs more than 3 times per year, despite other treatments. HOME CARE INSTRUCTIONS  Drink plenty of water. Water helps thin the mucus so your sinuses can drain more easily.  Use a humidifier.  Inhale steam 3-4 times a day (for example, sit in the bathroom with the shower running).  Apply a warm, moist washcloth to  your face 3-4 times a day, or as directed by your health care provider.  Use saline nasal sprays to help moisten and clean your sinuses.  Take medicines only as directed by your health care provider.  If you were prescribed either an  antibiotic or antifungal medicine, finish it all even if you start to feel better. SEEK IMMEDIATE MEDICAL CARE IF:  You have increasing pain or severe headaches.  You have nausea, vomiting, or drowsiness.  You have swelling around your face.  You have vision problems.  You have a stiff neck.  You have difficulty breathing.   This information is not intended to replace advice given to you by your health care provider. Make sure you discuss any questions you have with your health care provider.   Document Released: 12/08/2005 Document Revised: 12/29/2014 Document Reviewed: 12/23/2011 Elsevier Interactive Patient Education [employer].

## 2016-07-21 NOTE — Progress Notes (Signed)
Barbara Munoz , Mar 11, 1951, 65 y.o., female MRN: [id number] Patient Care Team    Relationship Specialty Notifications Start End  Tammi Sou, MD PCP - General Family Medicine  03/21/13   Delsa Bern, MD Consulting Physician Obstetrics and Gynecology  10/15/15   Milus Banister, MD Attending Physician Gastroenterology  10/15/15   Lavonna Monarch, MD Consulting Physician Dermatology  10/15/15     CC: Cough Subjective: Pt presents for an acute OV with complaints of cough of 1 week duration.  Associated symptoms include cough, congestion, low grade fever, body aches, rhinorrhea, PND, sore throat, sinus pain.  Husband is sick with similar symptoms. She was around her grandchild that was sick. Patient as tried tylenol and ibuprofen daily. She has used Sudafed a few times, gargled salt water.  No lung disease history.   No Known Allergies Social History  Substance Use Topics  . Smoking status: Never Smoker  . Smokeless tobacco: Never Used     Comment: smoked for about 6 months  . Alcohol use No     Comment: rarely   Past Medical History:  Diagnosis Date  . Anxiety and depression 09/08/2011  . Female bladder prolapse   . GERD (gastroesophageal reflux disease) 09/05/2011  . History of Clostridium difficile infection 2007  . History of cyst of breast    multiple drained during menopause  . Hyperlipidemia   . Insomnia 09/05/2011  . Migraine    worst during menopause  . Overweight (BMI 25.0-29.9) 09/05/2011  . Seasonal allergic rhinitis   . Vertigo, benign positional 09/08/2011   Past Surgical History:  Procedure Laterality Date  . CARDIOVASCULAR STRESS TEST  02/15/15   Exercise myocard perfusion testing: Normal EF, normal wall motion, normal perfusion.  Poor exercise capacity (4 min).  . COLONOSCOPY  07/2006   pseudomembranous colitis  . COSMETIC SURGERY    . DEXA  06/19/09   Normal  . POSTERIOR REPAIR  2012  . TONSILLECTOMY  82  . TUBAL LIGATION  1983  . vaginal hernia  repair  6-12   rectocele repair   Family History  Problem Relation Age of Onset  . Diabetes Mother 24    type 2  . Aneurysm Father     aortic  . Cancer Father     prostate and lung/ smoker  . Pernicious anemia Father   . Hyperlipidemia Sister   . Hypertension Sister   . Diabetes Sister     type 2  . Other Sister     degenerative disc disease  . Thyroid disease Sister   . Cancer Sister     breast  cancer s/p dbl mastectomy doing well  . Hyperlipidemia Brother   . Allergies Brother   . Diabetes Daughter     type 1  . Breast cancer Maternal Grandmother   . Heart attack Maternal Grandfather   . Heart disease Paternal Grandmother     CHF  . Pernicious anemia Paternal Grandmother   . Kidney disease Paternal Grandfather   . Stroke Paternal Grandfather   . Hypertension Sister   . Hyperlipidemia Sister   . Diabetes Sister     type 2  . Allergies Daughter   . Breast cancer Sister      Medication List       Accurate as of 07/21/16  3:06 PM. Always use your most recent med list.          acetaminophen 500 MG tablet Commonly known as:  TYLENOL Take 500  mg by mouth every 6 (six) hours as needed.   albuterol 108 (90 Base) MCG/ACT inhaler Commonly known as:  PROVENTIL HFA;VENTOLIN HFA Inhale 2 puffs into the lungs every 6 (six) hours as needed for wheezing or shortness of breath.   ALPRAZolam 0.25 MG tablet Commonly known as:  XANAX Take 1 tablet (0.25 mg total) by mouth 2 (two) times daily as needed for anxiety.   aspirin 81 MG tablet Take 81 mg by mouth daily.   CALCIUM 600+D 600-400 MG-UNIT tablet Generic drug:  Calcium Carbonate-Vitamin D Take 1 tablet by mouth daily.   FISH OIL PO Take by mouth daily.   fluticasone 50 MCG/ACT nasal spray Commonly known as:  FLONASE Place 2 sprays into both nostrils daily.   loratadine 10 MG tablet Commonly known as:  CLARITIN Take 10 mg by mouth daily as needed. Spring and fall   multivitamin tablet Take 1 tablet by  mouth daily.   ranitidine 150 MG tablet Commonly known as:  ZANTAC Take 150 mg by mouth daily as needed.   sertraline 100 MG tablet Commonly known as:  ZOLOFT Take 1.5 tablets (150 mg total) by mouth daily.   simvastatin 40 MG tablet Commonly known as:  ZOCOR TAKE 1 TABLET (40 MG TOTAL) BY MOUTH AT BEDTIME.       No results found for this or any previous visit (from the past 24 hour(s)). No results found.   ROS: Negative, with the exception of above mentioned in HPI   Objective:  BP 107/71 (BP Location: Right Arm, Patient Position: Sitting, Cuff Size: Normal)   Pulse 76   Temp 99.3 F (37.4 C) (Oral)   Resp 20   Wt 162 lb (73.5 kg)   SpO2 95%   BMI 28.25 kg/m  Body mass index is 28.25 kg/m. Gen: Afebrile. No acute distress. Nontoxic in appearance, well developed, well nourished. pleasant female.  HENT: AT. [place]. Bilateral TM visualized and normal. MMM, no oral lesions. Bilateral nares moderate erythema, no swelling. Throat without erythema or exudates. Cough and hoarseness present on exam Eyes:Pupils Equal Round Reactive to light, Extraocular movements intact,  Conjunctiva without redness, discharge or icterus. Neck/lymp/endocrine: Supple,right ant cervical  lymphadenopathy CV: RRR  Chest: CTAB, no wheeze or crackles. Good air movement, normal resp effort.  Abd: Soft. NTND. BS present.  Skin: no rashes, purpura or petechiae.  Neuro:  Normal gait. PERLA. EOMi. Alert. Oriented x3  Psych: Normal affect, dress and demeanor. Normal speech. Normal thought content and judgment.  Assessment/Plan: Barbara Munoz is a 65 y.o. female present for acute OV for  1. Acute maxillary sinusitis, recurrence not specified - amoxicillin-clavulanate (AUGMENTIN) 875-125 MG tablet; Take 1 tablet by mouth 2 (two) times daily.  Dispense: 20 tablet; Refill: 0 - mucinex plain.  - Hydrate and rest.  - F/U PRN    electronically signed by:  Howard Pouch, DO  [place]

## 2016-08-22 ENCOUNTER — Encounter: Payer: Self-pay | Admitting: Family Medicine

## 2016-08-29 ENCOUNTER — Telehealth: Payer: Self-pay | Admitting: Family Medicine

## 2016-08-29 MED ORDER — ALPRAZOLAM 0.25 MG PO TABS: 0.2500 mg | ORAL_TABLET | Freq: Two times a day (BID) | ORAL | 1 refills | Status: DC | PRN

## 2016-08-29 NOTE — Telephone Encounter (Signed)
Pt requesting Rf of xanax. Last Rx printed 11/06/14 # 180 X 1 rf. Last OV 07/21/16. Next OV 12/31/16. Please advise.

## 2016-08-29 NOTE — Telephone Encounter (Signed)
Alprazolam printed.

## 2016-09-01 NOTE — Telephone Encounter (Signed)
Rx sent to [facility]

## 2016-09-12 ENCOUNTER — Ambulatory Visit (INDEPENDENT_AMBULATORY_CARE_PROVIDER_SITE_OTHER): Payer: BC Managed Care – PPO

## 2016-09-12 DIAGNOSIS — Z23 Encounter for immunization: Secondary | ICD-10-CM | POA: Diagnosis not present

## 2016-10-22 HISTORY — PX: OTHER SURGICAL HISTORY: SHX169

## 2016-11-12 ENCOUNTER — Encounter: Payer: Self-pay | Admitting: Family Medicine

## 2016-11-17 ENCOUNTER — Other Ambulatory Visit: Payer: Self-pay | Admitting: Obstetrics and Gynecology

## 2016-11-17 DIAGNOSIS — Z1231 Encounter for screening mammogram for malignant neoplasm of breast: Secondary | ICD-10-CM

## 2016-12-10 ENCOUNTER — Ambulatory Visit
Admission: RE | Admit: 2016-12-10 | Discharge: 2016-12-10 | Disposition: A | Discharge status: Home / Self Care | Payer: Self-pay | Source: Ambulatory Visit | Attending: Obstetrics and Gynecology | Admitting: Obstetrics and Gynecology

## 2016-12-10 DIAGNOSIS — Z1231 Encounter for screening mammogram for malignant neoplasm of breast: Secondary | ICD-10-CM

## 2016-12-30 ENCOUNTER — Encounter: Payer: Self-pay | Admitting: Family Medicine

## 2016-12-30 ENCOUNTER — Ambulatory Visit (INDEPENDENT_AMBULATORY_CARE_PROVIDER_SITE_OTHER): Payer: Medicare Other | Admitting: Family Medicine

## 2016-12-30 VITALS — BP 108/69 | HR 74 | Temp 98.1°F | Resp 16 | Ht 60.35 in | Wt 163.8 lb

## 2016-12-30 DIAGNOSIS — E7800 Pure hypercholesterolemia, unspecified: Secondary | ICD-10-CM | POA: Diagnosis not present

## 2016-12-30 DIAGNOSIS — F32A Depression, unspecified: Secondary | ICD-10-CM

## 2016-12-30 DIAGNOSIS — F329 Major depressive disorder, single episode, unspecified: Secondary | ICD-10-CM

## 2016-12-30 DIAGNOSIS — K219 Gastro-esophageal reflux disease without esophagitis: Secondary | ICD-10-CM | POA: Diagnosis not present

## 2016-12-30 DIAGNOSIS — F419 Anxiety disorder, unspecified: Principal | ICD-10-CM

## 2016-12-30 DIAGNOSIS — F418 Other specified anxiety disorders: Secondary | ICD-10-CM

## 2016-12-30 NOTE — Progress Notes (Signed)
OFFICE VISIT  12/30/2016   CC:  Chief Complaint  Patient presents with  . Follow-up    RCI, pt is not fasting.    HPI:    Patient is a 66 y.o. Caucasian female who presents for 6 mo f/u anxiety and depression as well as hyperlipidemia.  Anxiety/depression: having to use xanax every night the last 3+ months.  Not taking a daytime dose.   She is not interested in pursuing counseling at this time.  She is helping husband deal with elderly/ill mother more lately.  She's doing well under these circumstances.  Mood stable.  Hyperlipidemia: tolerating simvastatin fine.  GERD: describes having lots of GER lately, still in a rut of choosing bad foods.  Notes that avoiding gluten foods seems to help.  Zantac not helpful.  No other med tried except tums.  Past Medical History:  Diagnosis Date  . Anxiety and depression 09/08/2011  . Female bladder prolapse   . GERD (gastroesophageal reflux disease) 09/05/2011  . History of Clostridium difficile infection 2007  . History of cyst of breast    multiple drained during menopause  . Hyperlipidemia   . Insomnia 09/05/2011  . Migraine    worst during menopause  . Overweight (BMI 25.0-29.9) 09/05/2011  . Seasonal allergic rhinitis   . Vertigo, benign positional 09/08/2011    Past Surgical History:  Procedure Laterality Date  . CARDIOVASCULAR STRESS TEST  02/15/15   Exercise myocard perfusion testing: Normal EF, normal wall motion, normal perfusion.  Poor exercise capacity (4 min).  . COLONOSCOPY  07/2006   pseudomembranous colitis  . COSMETIC SURGERY    . DEXA  06/19/09   Normal  . POSTERIOR REPAIR  2012  . TONSILLECTOMY  82  . Transvaginal ultrasound  10/2016   Normal (done for LLQ pain by Dr. Benay Pike).  . TUBAL LIGATION  1983  . vaginal hernia repair  6-12   rectocele repair    Outpatient Medications Prior to Visit  Medication Sig Dispense Refill  . acetaminophen (TYLENOL) 500 MG tablet Take 500 mg by mouth every 6 (six) hours as  needed.      [name] Kitchen albuterol (PROVENTIL HFA;VENTOLIN HFA) 108 (90 BASE) MCG/ACT inhaler Inhale 2 puffs into the lungs every 6 (six) hours as needed for wheezing or shortness of breath. 1 Inhaler 2  . ALPRAZolam (XANAX) 0.25 MG tablet Take 1 tablet (0.25 mg total) by mouth 2 (two) times daily as needed for anxiety. 180 tablet 1  . aspirin 81 MG tablet Take 81 mg by mouth daily.      . Calcium Carbonate-Vitamin D (CALCIUM 600+D) 600-400 MG-UNIT per tablet Take 1 tablet by mouth daily.      [name] Kitchen loratadine (CLARITIN) 10 MG tablet Take 10 mg by mouth daily as needed. Spring and fall     . Multiple Vitamin (MULTIVITAMIN) tablet Take 1 tablet by mouth daily.      . Omega-3 Fatty Acids (FISH OIL PO) Take by mouth daily.    . ranitidine (ZANTAC) 150 MG tablet Take 150 mg by mouth daily as needed.     . sertraline (ZOLOFT) 100 MG tablet Take 1.5 tablets (150 mg total) by mouth daily. 135 tablet 3  . simvastatin (ZOCOR) 40 MG tablet TAKE 1 TABLET (40 MG TOTAL) BY MOUTH AT BEDTIME. 90 tablet 3  . amoxicillin-clavulanate (AUGMENTIN) 875-125 MG tablet Take 1 tablet by mouth 2 (two) times daily. (Patient not taking: Reported on 12/30/2016) 20 tablet 0  . fluticasone (FLONASE) 50 MCG/ACT  nasal spray Place 2 sprays into both nostrils daily. (Patient not taking: Reported on 12/30/2016) 16 g 1   No facility-administered medications prior to visit.     No Known Allergies  ROS As per HPI  PE: Blood pressure 108/69, pulse 74, temperature 98.1 F (36.7 C), temperature source Oral, resp. rate 16, height 5' 0.35" (1.533 m), weight 163 lb 12 oz (74.3 kg), SpO2 96 %. Gen: Alert, well appearing.  Patient is oriented to person, place, time, and situation. AFFECT: pleasant, lucid thought and speech. No further exam today.  LABS:  Lab Results  Component Value Date   TSH 1.28 06/20/2016   Lab Results  Component Value Date   WBC 6.7 06/20/2016   HGB 13.6 06/20/2016   HCT 41.1 06/20/2016   MCV 88.4 06/20/2016   PLT  181.0 06/20/2016   Lab Results  Component Value Date   CREATININE 0.67 06/20/2016   BUN 19 06/20/2016   NA 141 06/20/2016   K 4.1 06/20/2016   CL 107 06/20/2016   CO2 31 06/20/2016   Lab Results  Component Value Date   ALT 22 06/20/2016   AST 22 06/20/2016   ALKPHOS 55 06/20/2016   BILITOT 0.4 06/20/2016   Lab Results  Component Value Date   CHOL 190 06/20/2016   Lab Results  Component Value Date   HDL 49.00 06/20/2016   Lab Results  Component Value Date   LDLCALC 111 (H) 06/20/2016   Lab Results  Component Value Date   TRIG 148.0 06/20/2016   Lab Results  Component Value Date   CHOLHDL 4 06/20/2016    IMPRESSION AND PLAN:  1) Anxiety and depression: stable.  The current medical regimen is effective;  continue present plan and medications.  2) Hyperlipidemia; tolerating statin.  Lipids good 6 mo ago.  3) GERD: pt prefers to continue to try dietary modification rather than try PPI.  An After Visit Summary was printed and given to the patient.  FOLLOW UP: Return in about 6 months (around 06/29/2017) for annual CPE with fasting labs the week prior.  Signed:  Crissie Sickles, MD           12/30/2016

## 2016-12-30 NOTE — Progress Notes (Signed)
Pre visit review using our clinic review tool, if applicable. No additional management support is needed unless otherwise documented below in the visit note. 

## 2016-12-31 ENCOUNTER — Ambulatory Visit: Payer: BC Managed Care – PPO | Admitting: Family Medicine

## 2017-03-12 ENCOUNTER — Encounter: Payer: Self-pay | Admitting: Family Medicine

## 2017-03-12 ENCOUNTER — Ambulatory Visit (INDEPENDENT_AMBULATORY_CARE_PROVIDER_SITE_OTHER): Payer: Medicare Other | Admitting: Family Medicine

## 2017-03-12 VITALS — BP 126/76 | HR 78 | Temp 97.5°F | Resp 16 | Wt 162.0 lb

## 2017-03-12 DIAGNOSIS — R5382 Chronic fatigue, unspecified: Secondary | ICD-10-CM

## 2017-03-12 DIAGNOSIS — M353 Polymyalgia rheumatica: Secondary | ICD-10-CM | POA: Diagnosis not present

## 2017-03-12 DIAGNOSIS — M533 Sacrococcygeal disorders, not elsewhere classified: Secondary | ICD-10-CM | POA: Diagnosis not present

## 2017-03-12 DIAGNOSIS — M2550 Pain in unspecified joint: Secondary | ICD-10-CM

## 2017-03-12 DIAGNOSIS — R300 Dysuria: Secondary | ICD-10-CM | POA: Diagnosis not present

## 2017-03-12 LAB — POCT URINALYSIS DIPSTICK
BILIRUBIN UA: NEGATIVE
Blood, UA: NEGATIVE
GLUCOSE UA: NEGATIVE
Ketones, UA: NEGATIVE
Leukocytes, UA: NEGATIVE
NITRITE UA: NEGATIVE
Protein, UA: NEGATIVE
Spec Grav, UA: 1.005 (ref 1.030–1.035)
UROBILINOGEN UA: 0.2 (ref ?–2.0)
pH, UA: 6 (ref 5.0–8.0)

## 2017-03-12 LAB — COMPREHENSIVE METABOLIC PANEL
ALBUMIN: 4.5 g/dL (ref 3.5–5.2)
ALT: 26 U/L (ref 0–35)
AST: 26 U/L (ref 0–37)
Alkaline Phosphatase: 57 U/L (ref 39–117)
BILIRUBIN TOTAL: 0.3 mg/dL (ref 0.2–1.2)
BUN: 14 mg/dL (ref 6–23)
CALCIUM: 9.9 mg/dL (ref 8.4–10.5)
CHLORIDE: 105 meq/L (ref 96–112)
CO2: 29 meq/L (ref 19–32)
CREATININE: 0.71 mg/dL (ref 0.40–1.20)
GFR: 87.72 mL/min (ref >60.00)
Glucose, Bld: 86 mg/dL (ref 70–99)
Potassium: 4.2 mEq/L (ref 3.5–5.1)
SODIUM: 141 meq/L (ref 135–145)
Total Protein: 7.3 g/dL (ref 6.0–8.3)

## 2017-03-12 LAB — CBC WITH DIFFERENTIAL/PLATELET
BASOS PCT: 0.6 % (ref 0.0–3.0)
Basophils Absolute: 0 10*3/uL (ref 0.0–0.1)
EOS PCT: 1.3 % (ref 0.0–5.0)
Eosinophils Absolute: 0.1 10*3/uL (ref 0.0–0.7)
HCT: 39 % (ref 36.0–46.0)
HEMOGLOBIN: 12.9 g/dL (ref 12.0–15.0)
LYMPHS ABS: 2.6 10*3/uL (ref 0.7–4.0)
Lymphocytes Relative: 39 % (ref 12.0–46.0)
MCHC: 33.2 g/dL (ref 30.0–36.0)
MCV: 90.4 fl (ref 78.0–100.0)
MONO ABS: 0.6 10*3/uL (ref 0.1–1.0)
Monocytes Relative: 9.3 % (ref 3.0–12.0)
Neutro Abs: 3.3 10*3/uL (ref 1.4–7.7)
Neutrophils Relative %: 49.8 % (ref 43.0–77.0)
PLATELETS: 196 10*3/uL (ref 150.0–400.0)
RBC: 4.31 Mil/uL (ref 3.87–5.11)
RDW: 13.9 % (ref 11.5–15.5)
WBC: 6.6 10*3/uL (ref 4.0–10.5)

## 2017-03-12 LAB — CK: Total CK: 96 U/L (ref 7–177)

## 2017-03-12 LAB — TSH: TSH: 8.2 u[IU]/mL — ABNORMAL HIGH (ref 0.35–4.50)

## 2017-03-12 LAB — SEDIMENTATION RATE: SED RATE: 25 mm/h (ref 0–30)

## 2017-03-12 LAB — C-REACTIVE PROTEIN: CRP: 0.1 mg/dL — ABNORMAL LOW (ref 0.5–20.0)

## 2017-03-12 MED ORDER — MELOXICAM 15 MG PO TABS: 15.0000 mg | ORAL_TABLET | Freq: Every day | ORAL | 2 refills | Status: DC

## 2017-03-12 NOTE — Progress Notes (Signed)
OFFICE VISIT  03/12/2017   CC:  Chief Complaint  Patient presents with  . Back Pain    Low back pain, hip pain   HPI:    Patient is a 66 y.o. Caucasian female who presents for low back pain. Started about 3 weeks ago, LB pain after getting out of bed.  Mostly pain in coccyx area and L SI joint region. Says the pain is not "bad", just lingering.  Often feels mild pain going down L leg but no tingling or numbness. Stretching tid does help.  Advil and tylenol helps as well.  Ice helps some.  Other c/o pain over the last 2-3 months--- some intermittent hip pain, either side.  This lasts only 2-3 min.  Achy in shoulders, hips, knees, ankles--not all at the same time.  Roving, not symmetric.  No obvious joint swelling, no redness.  Occ she says her knees feel swollen even if they don't look swollen.  Has intermittent, brief soreness in thighs and upper arms. She notes a hx of similar pattern of body aches in the remote past during a time of significant stress in her life.  No preceding incident/strain/trauma.  No hx of back x-ray.  Has had discomfort in urethral area after urinating last 1 week.  No new urgency or frequency.  No fevers.  No wt gain.  No wt loss.  No focal weakness.  +Mild chronic fatigue.   Past Medical History:  Diagnosis Date  . Anxiety and depression 09/08/2011  . Female bladder prolapse   . GERD (gastroesophageal reflux disease) 09/05/2011  . History of Clostridium difficile infection 2007  . History of cyst of breast    multiple drained during menopause  . Hyperlipidemia   . Insomnia 09/05/2011  . Migraine    worst during menopause  . Overweight (BMI 25.0-29.9) 09/05/2011  . Seasonal allergic rhinitis   . Vertigo, benign positional 09/08/2011    Past Surgical History:  Procedure Laterality Date  . CARDIOVASCULAR STRESS TEST  02/15/15   Exercise myocard perfusion testing: Normal EF, normal wall motion, normal perfusion.  Poor exercise capacity (4 min).  .  COLONOSCOPY  07/2006   pseudomembranous colitis  . COSMETIC SURGERY    . DEXA  06/19/09   Normal  . POSTERIOR REPAIR  2012  . TONSILLECTOMY  82  . Transvaginal ultrasound  10/2016   Normal (done for LLQ pain by Dr. Benay Pike).  . TUBAL LIGATION  1983  . vaginal hernia repair  6-12   rectocele repair    Outpatient Medications Prior to Visit  Medication Sig Dispense Refill  . acetaminophen (TYLENOL) 500 MG tablet Take 500 mg by mouth every 6 (six) hours as needed.      [name] Kitchen albuterol (PROVENTIL HFA;VENTOLIN HFA) 108 (90 BASE) MCG/ACT inhaler Inhale 2 puffs into the lungs every 6 (six) hours as needed for wheezing or shortness of breath. 1 Inhaler 2  . ALPRAZolam (XANAX) 0.25 MG tablet Take 1 tablet (0.25 mg total) by mouth 2 (two) times daily as needed for anxiety. 180 tablet 1  . aspirin 81 MG tablet Take 81 mg by mouth daily.      . Calcium Carbonate-Vitamin D (CALCIUM 600+D) 600-400 MG-UNIT per tablet Take 1 tablet by mouth daily.      [name] Kitchen loratadine (CLARITIN) 10 MG tablet Take 10 mg by mouth daily as needed. Spring and fall     . Multiple Vitamin (MULTIVITAMIN) tablet Take 1 tablet by mouth daily.      [name] Kitchen  Omega-3 Fatty Acids (FISH OIL PO) Take by mouth daily.    . ranitidine (ZANTAC) 150 MG tablet Take 150 mg by mouth daily as needed.     . sertraline (ZOLOFT) 100 MG tablet Take 1.5 tablets (150 mg total) by mouth daily. 135 tablet 3  . simvastatin (ZOCOR) 40 MG tablet TAKE 1 TABLET (40 MG TOTAL) BY MOUTH AT BEDTIME. 90 tablet 3   No facility-administered medications prior to visit.     No Known Allergies  ROS As per HPI  PE: Blood pressure 126/76, pulse 78, temperature 97.5 F (36.4 C), temperature source Oral, resp. rate 16, weight 162 lb (73.5 kg), SpO2 97 %. Gen: Alert, well appearing.  Patient is oriented to person, place, time, and situation. AFFECT: pleasant, lucid thought and speech. KCL:EXNT: no injection, icteris, swelling, or exudate.  EOMI, PERRLA. Mouth: lips  without lesion/swelling.  Oral mucosa pink and moist. Oropharynx without erythema, exudate, or swelling.  BACK: ROM fully intact, with mild pain with full L spine flexion as well as with lateral bending (L>R). Mild TTP in SI joint regions bilat, L>>R.  No lumbar tenderness.  FABER maneuver elicits pain in SI joints area at full hip abduction/full ER.  Sitting SLR neg bilat.  LE strength 5/5 prox/dist bilat.  Patellar DTRs 4+ bilat, cannot elicit any achilles DTRs. She has mild TTP in left scapular region and in R greater troch region. Otherwise, all soft tissues and joints are w/out tenderness.  No joint swelling, erythema, or warmth.  LABS:   CC UA today: NORMAL  Lab Results  Component Value Date   WBC 6.6 03/12/2017   HGB 12.9 03/12/2017   HCT 39.0 03/12/2017   MCV 90.4 03/12/2017   PLT 196.0 03/12/2017     Chemistry      Component Value Date/Time   NA 141 03/12/2017 1155   K 4.2 03/12/2017 1155   CL 105 03/12/2017 1155   CO2 29 03/12/2017 1155   BUN 14 03/12/2017 1155   CREATININE 0.71 03/12/2017 1155      Component Value Date/Time   CALCIUM 9.9 03/12/2017 1155   ALKPHOS 57 03/12/2017 1155   AST 26 03/12/2017 1155   ALT 26 03/12/2017 1155   BILITOT 0.3 03/12/2017 1155      IMPRESSION AND PLAN:  1) SI joint pain/dysfunction, L>R. Plan: start mobic 27m qd prn.  [place] [place]  2) Polymyalgia and polyarthralgia: question of new onset fibromyalgia syndrome. Since this is a dx of exclusion, will further evaluate with CK, ANA, Rh factor, ESR, CRP, and TSH.  3) Vague dysuria: normal UA today.  Reassured pt at this time.  An After Visit Summary was printed and given to the patient.  Spent 40 min with pt today, with >50% of this time spent in counseling and care coordination regarding the above problems.  FOLLOW UP: Return in about 4 weeks (around 04/09/2017), or f/u musculoskeletal pain.  Signed:  PCrissie Sickles MD           03/12/2017

## 2017-03-12 NOTE — Progress Notes (Signed)
Pre visit review using our clinic review tool, if applicable. No additional management support is needed unless otherwise documented below in the visit note. 

## 2017-03-13 ENCOUNTER — Other Ambulatory Visit: Payer: Self-pay | Admitting: *Deleted

## 2017-03-13 ENCOUNTER — Other Ambulatory Visit (INDEPENDENT_AMBULATORY_CARE_PROVIDER_SITE_OTHER): Payer: Medicare Other

## 2017-03-13 DIAGNOSIS — R946 Abnormal results of thyroid function studies: Secondary | ICD-10-CM | POA: Diagnosis not present

## 2017-03-13 DIAGNOSIS — R7989 Other specified abnormal findings of blood chemistry: Secondary | ICD-10-CM

## 2017-03-13 LAB — T4, FREE: Free T4: 0.65 ng/dL (ref 0.60–1.60)

## 2017-03-13 LAB — ANTINUCLEAR ANTIBODIES, IFA: ANA Titer 1: NEGATIVE

## 2017-03-13 LAB — RHEUMATOID FACTOR

## 2017-03-13 LAB — T3, FREE: T3, Free: 3.8 pg/mL (ref 2.3–4.2)

## 2017-03-15 ENCOUNTER — Encounter: Payer: Self-pay | Admitting: Family Medicine

## 2017-03-30 ENCOUNTER — Encounter: Payer: Self-pay | Admitting: Physical Therapy

## 2017-03-30 ENCOUNTER — Ambulatory Visit: Payer: Medicare Other | Attending: Family Medicine | Admitting: Physical Therapy

## 2017-03-30 DIAGNOSIS — M545 Low back pain: Secondary | ICD-10-CM | POA: Insufficient documentation

## 2017-03-30 NOTE — Therapy (Signed)
[facility] [place], [place], [address] Phone: [phone]   Fax:  [phone]  Physical Therapy Evaluation  Patient Details  Name: Barbara Munoz MRN: [id number] Date of Birth: 13-Jun-1951 Referring Provider: Ricardo Jericho  Encounter Date: 03/30/2017      PT End of Session - 03/30/17 1231    Visit Number 1   Number of Visits 12   Date for PT Re-Evaluation 05/29/17   PT Start Time 0951   PT Stop Time 1040   PT Time Calculation (min) 49 min   Activity Tolerance Patient tolerated treatment well   Behavior During Therapy [facility] for tasks assessed/performed      Past Medical History:  Diagnosis Date  . Anxiety and depression 09/08/2011  . Female bladder prolapse   . GERD (gastroesophageal reflux disease) 09/05/2011  . History of Clostridium difficile infection 2007  . History of cyst of breast    multiple drained during menopause  . Hyperlipidemia   . Insomnia 09/05/2011  . Migraine    worst during menopause  . Overweight (BMI 25.0-29.9) 09/05/2011  . Seasonal allergic rhinitis   . Subclinical hypothyroidism   . Vertigo, benign positional 09/08/2011    Past Surgical History:  Procedure Laterality Date  . CARDIOVASCULAR STRESS TEST  02/15/15   Exercise myocard perfusion testing: Normal EF, normal wall motion, normal perfusion.  Poor exercise capacity (4 min).  . COLONOSCOPY  07/2006   pseudomembranous colitis  . COSMETIC SURGERY    . DEXA  06/19/09   Normal  . POSTERIOR REPAIR  2012  . TONSILLECTOMY  82  . Transvaginal ultrasound  10/2016   Normal (done for LLQ pain by Dr. Benay Pike).  . TUBAL LIGATION  1983  . vaginal hernia repair  6-12   rectocele repair    There were no vitals filed for this visit.       Subjective Assessment - 03/30/17 1232    Subjective The patient presents to OPPT with c/o right sided low back pain and occasional left sided low back pain.  She reports that her pain began back in  [place] which she feels is related to immobility.  Her resting pain-leel is a low 1-2/10 today but her pain can risew to high levels (7-7+/10).  Resting, stretching and meds decrease pain..  Increased activity increase her pain.   Patient Stated Goals Get out of pain.   Currently in Pain? Yes   Pain Score 2    Pain Location Back   Pain Orientation Right   Pain Descriptors / Indicators Aching;Sharp   Pain Type Acute pain   Pain Onset More than a month ago   Pain Frequency Constant   Aggravating Factors  See above.   Pain Relieving Factors See above.            [facility] PT Assessment - 03/30/17 0001      Assessment   Medical Diagnosis SIJ dysfunction.   Referring Provider Ricardo Jericho   Onset Date/Surgical Date --  January 2018.     Precautions   Precautions None     Restrictions   Weight Bearing Restrictions No     Balance Screen   Has the patient fallen in the past 6 months No   Has the patient had a decrease in activity level because of a fear of falling?  No   Is the patient reluctant to leave their home because of a fear of falling?  No     Home Environment  Living Environment Private residence     Prior Function   Level of Independence Independent     Posture/Postural Control   Posture/Postural Control No significant limitations     ROM / Strength   AROM / PROM / Strength AROM;Strength     AROM   Overall AROM Comments Full active spinal range of motion.  Hypermobile in hip joints.     Strength   Overall Strength Comments Normal bilateral LE strength.     Palpation   Palpation comment Right SIJ pain.     Special Tests    Special Tests Lumbar;Leg LengthTest  Normal bilateral LE DTR's.   Lumbar Tests --  (-)SLR testing; (-) FABER testing.   Leg length test  --  Equal leg lengths.     Ambulation/Gait   Gait Comments WNL.                   [facility] Adult PT Treatment/Exercise - 03/30/17 0001      Modalities   Modalities Electrical  Stimulation;Moist Heat     Moist Heat Therapy   Number Minutes Moist Heat 15 Minutes   Moist Heat Location --  Low back.     Acupuncturist Location Right SIJ   Electrical Stimulation Action Pre-mod.   Electrical Stimulation Parameters 80-150 Hz x 15 minutes.   Electrical Stimulation Goals Pain                  PT Short Term Goals - 03/30/17 1254      PT SHORT TERM GOAL #1   Title STG's=LTG's.           PT Long Term Goals - 03/30/17 1255      PT LONG TERM GOAL #1   Title Ind with an HEP.   Time 6   Period Weeks   Status New     PT LONG TERM GOAL #2   Title Perform ADL's with pain not > 3/10.   Time 6   Period Weeks   Status New               Plan - 03/30/17 1251    Clinical Impression Statement The patient presents with right sided low back pain.  She is tender over her right SIJ.  Patient is very hypermobile and will benefit for stabilization exercises.   Rehab Potential Excellent   PT Frequency 2x / week   PT Duration 6 weeks   PT Treatment/Interventions ADLs/Self Care Home Management;Cryotherapy;Electrical Stimulation;Ultrasound;Moist Heat;Therapeutic activities;Therapeutic exercise;Patient/family education;Manual techniques;Dry needling   PT Next Visit Plan STW/M to right SIJ; Combo e'stim/U/S; low back stabilization exercises.   Consulted and Agree with Plan of Care Patient      Patient will benefit from skilled therapeutic intervention in order to improve the following deficits and impairments:  Pain, Decreased activity tolerance, Hypermobility  Visit Diagnosis: Acute right-sided low back pain, with sciatica presence unspecified - Plan: PT plan of care cert/re-cert      G-Codes - 63/01/60 04/02/27    Functional Assessment Tool Used (Outpatient Only) 62% limitation.   Functional Limitation Mobility: Walking and moving around   Mobility: Walking and Moving Around Current Status [phone]) At least 60 percent  but less than 80 percent impaired, limited or restricted   Mobility: Walking and Moving Around Goal Status [phone]) At least 20 percent but less than 40 percent impaired, limited or restricted       Problem List Patient Active Problem List  Diagnosis Date Noted  . Maxillary sinusitis 08/08/2015  . 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 Health maintenance examination 11/03/2013  . Preventative health care 11/09/2012  . Anxiety and depression 09/08/2011  . GERD (gastroesophageal reflux disease) 09/05/2011  . Overweight (BMI 25.0-29.9) 09/05/2011  . Insomnia 09/05/2011  . Hyperlipidemia   . Migraine     Burnadette Baskett, [place] MPT 03/30/2017, 12:58 PM  [facility] [address] [place], [place], [address] Phone: [phone]   Fax:  [phone]  Name: Barbara Munoz MRN: [id number] Date of Birth: 05/30/1951

## 2017-03-31 ENCOUNTER — Ambulatory Visit: Payer: Medicare Other | Admitting: Physical Therapy

## 2017-03-31 DIAGNOSIS — M545 Low back pain: Secondary | ICD-10-CM | POA: Diagnosis not present

## 2017-03-31 NOTE — Patient Instructions (Signed)
Piriformis (Supine)  Cross legs, right on top. Gently pull other knee toward chest until stretch is felt in buttock/hip of top leg. Hold _60___ seconds. Repeat __2__ times per set. Do ____ sets per session. Do _2___ sessions per day.  Hip Stretch (alternate)  Put right ankle over left knee. Let right knee fall downward, but keep ankle in place. Feel the stretch in hip. May push down gently with hand to feel stretch. Hold _60___ seconds while counting out loud. Repeat with other leg. Repeat __2__ times. Do ____ sessions per day.  Lower abdominal/core stability exercises  1. Practice your breathing technique: Inhale through your nose expanding your belly and rib cage. Try not to breathe into your chest. Exhale slowly and gradually out your mouth feeling a sense of softness to your body. Practice multiple times. This can be performed unlimited.  2. Finding the lower abdominals. Laying on your back with the knees bent, place your fingers just below your belly button. Using your breathing technique from above, on your exhale gently pull the belly button away from your fingertips without tensing any other muscles. Practice this 5x. Next, as you exhale, draw belly button inwards and hold onto it...then feel as if you are pulling that muscle across your pelvis like you are tightening a belt. This can be hard to do at first so be patient and practice. Do 5-10 reps 1-3 x day. Always recognize quality over quantity; if your abdominal muscles become tired you will notice you may tighten/contract other muscles. This is the time to take a break.   Practice this first laying on your back, then in sitting, progressing to standing and finally adding it to all your daily movements.   3. Finding your pelvic floor. Using the breathing technique above, when your exhale, this time draw your pelvic floor muscles up as if you were attempting to stop the flow of urination. Be careful NOT to tense any other muscles. This  can be hard, BE PATIENT. Try to hold up to 10 seconds repeating 10x. Try 2x a day. Once you feel you are doing this well, add this contraction to exercise #2. First contracting your pelvic floor followed by lower abdominals.   4. Adding leg movements. Add the following leg movements to challenge your ability to keep your core stable:  1. Single leg drop outs: Laying on your back with knees bent feet flat. Inhale,  dropping one knee outward KEEPING YOUR PELVIS STILL. Exhale as you bring the leg back, simultaneously performing your lower abdominal contraction. Do 5-10 on each leg.   2. Marching: While keeping your pelvis still, lift the right foot a few inches, put it down then lift left foot. This will mimic a march. Start slow to establish control. Once you have control you may speed it up. Do 10-20x. You MUST keep your lower abdominlas contracted while you march. Breathe naturally    3. Single leg slides: Inhale while you slowly slide one leg out keeping your pelvis still. Only slide your leg as far as you can keep your pelvis still. Exhale as you bring the leg back to the start, contracting the lower abdominals as you do that. Keep your upper body relaxed. Do 5-10 on each side.      Pelvic Press  [email or web address]   Place hands under belly between navel and pubic bone, palms up. Feel pressure on hands. Increase pressure on hands by pressing pelvis down. This is NOT a pelvic tilt. Hold __5_ seconds.  Relax. Repeat _10__ times. Once a day.  KNEE: Flexion - Prone   Hold pelvic press. Bend knee. Raise heel toward buttocks. Repeat on opposite leg. Do not raise hips. _10__ reps per set. When this is mastered, pull both heels up at same time, x 10 reps.  Once a day   Hip Extension (Prone)  Hold pelvic press  Lift left leg _3___ inches from floor, keeping knee locked. Repeat __10__ times per set. Do _1___ sets per session. Do _10___ sessions per day.  HIP: Extension / KNEE: Flexion - Prone     Hold pelvic press. Bend knee, squeeze glutes. Raise leg up  10___ reps per set, _2__ sets per day, _5__ days per week    Abductor Strength: Bridge Pose (Strap)   Make strap wide enough to brace knees at hip width. Press into strap with knees. Hold for _10 seconds. Repeat _10___ times. Do 1-3 sets. 1-2 times per day.   Barbara Munoz, PT 03/31/17 11:48 AM [facility] [address] [place], [place], [address] Phone: [phone]   Fax:  [phone]

## 2017-03-31 NOTE — Therapy (Signed)
[facility] [place], [place], [address] Phone: [phone]   Fax:  [phone]  Physical Therapy Treatment  Patient Details  Name: Barbara Munoz MRN: [id number] Date of Birth: 1951/07/20 Referring Provider: Ricardo Jericho  Encounter Date: 03/31/2017      PT End of Session - 03/31/17 1121    Visit Number 2   Number of Visits 12   Date for PT Re-Evaluation 05/29/17   PT Start Time 1121   PT Stop Time 1202   PT Time Calculation (min) 41 min   Activity Tolerance Patient tolerated treatment well   Behavior During Therapy [facility] for tasks assessed/performed      Past Medical History:  Diagnosis Date  . Anxiety and depression 09/08/2011  . Female bladder prolapse   . GERD (gastroesophageal reflux disease) 09/05/2011  . History of Clostridium difficile infection 2007  . History of cyst of breast    multiple drained during menopause  . Hyperlipidemia   . Insomnia 09/05/2011  . Migraine    worst during menopause  . Overweight (BMI 25.0-29.9) 09/05/2011  . Seasonal allergic rhinitis   . Subclinical hypothyroidism   . Vertigo, benign positional 09/08/2011    Past Surgical History:  Procedure Laterality Date  . CARDIOVASCULAR STRESS TEST  02/15/15   Exercise myocard perfusion testing: Normal EF, normal wall motion, normal perfusion.  Poor exercise capacity (4 min).  . COLONOSCOPY  07/2006   pseudomembranous colitis  . COSMETIC SURGERY    . DEXA  06/19/09   Normal  . POSTERIOR REPAIR  2012  . TONSILLECTOMY  82  . Transvaginal ultrasound  10/2016   Normal (done for LLQ pain by Dr. Benay Pike).  . TUBAL LIGATION  1983  . vaginal hernia repair  6-12   rectocele repair    There were no vitals filed for this visit.      Subjective Assessment - 03/31/17 1122    Subjective Patient reports that she feels like the pain is actually in both legs. She paid more attention to it yesterday and noticed pain with bed mobility.  Today the left hurts more than the right.   Patient Stated Goals Get out of pain.   Currently in Pain? Yes   Pain Score 2    Pain Location Back   Pain Orientation Lower;Left   Pain Descriptors / Indicators Aching                         [facility] Adult PT Treatment/Exercise - 03/31/17 0001      Exercises   Exercises Lumbar     Lumbar Exercises: Stretches   Piriformis Stretch 2 reps;30 seconds  B     Lumbar Exercises: Supine   Ab Set 10 reps;5 seconds   Clam 10 reps  1x 6; 1x4   Bent Knee Raise Limitations unable to hold TA   Bridge 10 reps   2 sets;10 sec hold with green band     Lumbar Exercises: Prone   Straight Leg Raise 10 reps  B   Other Prone Lumbar Exercises pelvic press series 5 sec hold x 10; with knee bends x 5 ea and B   Other Prone Lumbar Exercises mule kick x 10 ea                PT Education - 03/31/17 1156    Education provided Yes   Education Details HEP   Person(s) Educated Patient   Methods Explanation;Demonstration;Handout  Comprehension Returned demonstration;Verbalized understanding          PT Short Term Goals - April 17, 2017 1254      PT SHORT TERM GOAL #1   Title STG's=LTG's.           PT Long Term Goals - 17-Apr-2017 1255      PT LONG TERM GOAL #1   Title Ind with an HEP.   Time 6   Period Weeks   Status New     PT LONG TERM GOAL #2   Title Perform ADL's with pain not > 3/10.   Time 6   Period Weeks   Status New               Plan - 03/31/17 1157    Clinical Impression Statement Patient presented today with c/o pain in to L side > than R side. She did very well with TE and has good basic strength of TA and multifidus.    PT Treatment/Interventions ADLs/Self Care Home Management;Cryotherapy;Electrical Stimulation;Ultrasound;Moist Heat;Therapeutic activities;Therapeutic exercise;Patient/family education;Manual techniques;Dry needling   PT Next Visit Plan Continue with stabilization TE; modalities and  STW prn.   PT Home Exercise Plan bridge with band, TA series, pelvic press series      Patient will benefit from skilled therapeutic intervention in order to improve the following deficits and impairments:  Pain, Decreased activity tolerance, Hypermobility  Visit Diagnosis: Acute right-sided low back pain, with sciatica presence unspecified       G-Codes - 2017/04/17 1228    Functional Assessment Tool Used (Outpatient Only) 62% limitation.   Functional Limitation Mobility: Walking and moving around   Mobility: Walking and Moving Around Current Status [phone]) At least 60 percent but less than 80 percent impaired, limited or restricted   Mobility: Walking and Moving Around Goal Status [phone]) At least 20 percent but less than 40 percent impaired, limited or restricted      Problem List Patient Active Problem List   Diagnosis Date Noted  . Maxillary sinusitis 08/08/2015  . 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 Health maintenance examination 11/03/2013  . Preventative health care 11/09/2012  . Anxiety and depression 09/08/2011  . GERD (gastroesophageal reflux disease) 09/05/2011  . Overweight (BMI 25.0-29.9) 09/05/2011  . Insomnia 09/05/2011  . Hyperlipidemia   . Migraine     Madelyn Flavors PT 03/31/2017, 12:20 PM  [facility] [address] [place], [place], [address] Phone: [phone]   Fax:  [phone]  Name: Barbara Munoz MRN: [id number] Date of Birth: [birth year]

## 2017-04-07 ENCOUNTER — Ambulatory Visit (INDEPENDENT_AMBULATORY_CARE_PROVIDER_SITE_OTHER): Payer: Medicare Other | Admitting: Family Medicine

## 2017-04-07 ENCOUNTER — Encounter: Payer: Self-pay | Admitting: Family Medicine

## 2017-04-07 VITALS — BP 104/64 | HR 78 | Temp 98.2°F | Resp 16 | Ht 60.35 in | Wt 162.8 lb

## 2017-04-07 DIAGNOSIS — M791 Myalgia: Secondary | ICD-10-CM

## 2017-04-07 DIAGNOSIS — M533 Sacrococcygeal disorders, not elsewhere classified: Secondary | ICD-10-CM | POA: Diagnosis not present

## 2017-04-07 DIAGNOSIS — R5382 Chronic fatigue, unspecified: Secondary | ICD-10-CM

## 2017-04-07 DIAGNOSIS — M7918 Myalgia, other site: Secondary | ICD-10-CM

## 2017-04-07 DIAGNOSIS — E039 Hypothyroidism, unspecified: Secondary | ICD-10-CM | POA: Diagnosis not present

## 2017-04-07 DIAGNOSIS — L659 Nonscarring hair loss, unspecified: Secondary | ICD-10-CM

## 2017-04-07 DIAGNOSIS — E038 Other specified hypothyroidism: Secondary | ICD-10-CM

## 2017-04-07 LAB — IRON AND TIBC
%SAT: 13 % (ref 11–50)
IRON: 53 ug/dL (ref 45–160)
TIBC: 410 ug/dL (ref 250–450)
UIBC: 357 ug/dL (ref 125–400)

## 2017-04-07 LAB — VITAMIN B12: VITAMIN B 12: 355 pg/mL (ref 211–911)

## 2017-04-07 LAB — FERRITIN: FERRITIN: 6.6 ng/mL — AB (ref 10.0–291.0)

## 2017-04-07 MED ORDER — MELOXICAM 7.5 MG PO TABS: ORAL_TABLET | ORAL | 6 refills | Status: DC

## 2017-04-07 NOTE — Progress Notes (Signed)
OFFICE VISIT  04/07/2017   CC:  Chief Complaint  Patient presents with  . Follow-up    Musculoskeletal Pain     HPI:    Patient is a 66 y.o. Caucasian female who presents for 4 week f/u musculoskeletal pain. She had SI joint dysfunction and I referred her to PT and started her on mobic 15mg  qd. She also had relatively recently onset of diffuse polymyalgia and polyarthralgia, which I felt was likely a presentation of fibromyalgia. Rheum lab eval was negative except mild subclinical hypothyroidism, which I plan to f/u with repeat labs 06/2017.  After about a week of meloxicam daily she started to note signif improvement in her diffuse musculoskeletal pain.  It still bothers her some but much more minimally. Has gone to PT a few times for her SI joint dysfunction/pain, has been doing home stretching/strengthening exercises.  Feels like this is gradually improving as well.  Says acid reflux is a problem, not quite daily, admits she has to work on her diet, sx's worse with meloxicam use, particularly if taken on empty stomach.    Past Medical History:  Diagnosis Date  . Anxiety and depression 09/08/2011  . Female bladder prolapse   . GERD (gastroesophageal reflux disease) 09/05/2011  . History of Clostridium difficile infection 2007  . History of cyst of breast    multiple drained during menopause  . Hyperlipidemia   . Insomnia 09/05/2011  . Migraine    worst during menopause  . Overweight (BMI 25.0-29.9) 09/05/2011  . Seasonal allergic rhinitis   . Subclinical hypothyroidism   . Vertigo, benign positional 09/08/2011    Past Surgical History:  Procedure Laterality Date  . CARDIOVASCULAR STRESS TEST  02/15/15   Exercise myocard perfusion testing: Normal EF, normal wall motion, normal perfusion.  Poor exercise capacity (4 min).  . COLONOSCOPY  07/2006   pseudomembranous colitis  . COSMETIC SURGERY    . DEXA  06/19/09   Normal  . POSTERIOR REPAIR  2012  . TONSILLECTOMY  82  .  Transvaginal ultrasound  10/2016   Normal (done for LLQ pain by Dr. Benay Pike).  . TUBAL LIGATION  1983  . vaginal hernia repair  6-12   rectocele repair    Outpatient Medications Prior to Visit  Medication Sig Dispense Refill  . acetaminophen (TYLENOL) 500 MG tablet Take 500 mg by mouth every 6 (six) hours as needed.      [name] Kitchen albuterol (PROVENTIL HFA;VENTOLIN HFA) 108 (90 BASE) MCG/ACT inhaler Inhale 2 puffs into the lungs every 6 (six) hours as needed for wheezing or shortness of breath. 1 Inhaler 2  . ALPRAZolam (XANAX) 0.25 MG tablet Take 1 tablet (0.25 mg total) by mouth 2 (two) times daily as needed for anxiety. 180 tablet 1  . aspirin 81 MG tablet Take 81 mg by mouth daily.      . Calcium Carbonate-Vitamin D (CALCIUM 600+D) 600-400 MG-UNIT per tablet Take 1 tablet by mouth daily.      [name] Kitchen loratadine (CLARITIN) 10 MG tablet Take 10 mg by mouth daily as needed. Spring and fall     . Multiple Vitamin (MULTIVITAMIN) tablet Take 1 tablet by mouth daily.      . Omega-3 Fatty Acids (FISH OIL PO) Take by mouth daily.    . ranitidine (ZANTAC) 150 MG tablet Take 150 mg by mouth daily as needed.     . sertraline (ZOLOFT) 100 MG tablet Take 1.5 tablets (150 mg total) by mouth daily. 135 tablet 3  .  simvastatin (ZOCOR) 40 MG tablet TAKE 1 TABLET (40 MG TOTAL) BY MOUTH AT BEDTIME. 90 tablet 3  . meloxicam (MOBIC) 15 MG tablet Take 1 tablet (15 mg total) by mouth daily. 30 tablet 2   No facility-administered medications prior to visit.     No Known Allergies  ROS As per HPI  PE: Blood pressure 104/64, pulse 78, temperature 98.2 F (36.8 C), temperature source Oral, resp. rate 16, height 5' 0.35" (1.533 m), weight 162 lb 12 oz (73.8 kg), SpO2 97 %. Gen: Alert, well appearing.  Patient is oriented to person, place, time, and situation. AFFECT: pleasant, lucid thought and speech. Scalp: sparse hair thinning mainly noted in central scalp region. No further exam today.  LABS:   Lab Results   Component Value Date   TSH 8.20 (H) 03/12/2017  Free T4 and T3 total were normal 03/12/17.  Lab Results  Component Value Date   WBC 6.6 03/12/2017   HGB 12.9 03/12/2017   HCT 39.0 03/12/2017   MCV 90.4 03/12/2017   PLT 196.0 03/12/2017   Lab Results  Component Value Date   CREATININE 0.71 03/12/2017   BUN 14 03/12/2017   NA 141 03/12/2017   K 4.2 03/12/2017   CL 105 03/12/2017   CO2 29 03/12/2017   Lab Results  Component Value Date   ALT 26 03/12/2017   AST 26 03/12/2017   ALKPHOS 57 03/12/2017   BILITOT 0.3 03/12/2017   Lab Results  Component Value Date   CHOL 190 06/20/2016   Lab Results  Component Value Date   HDL 49.00 06/20/2016   Lab Results  Component Value Date   LDLCALC 111 (H) 06/20/2016   Lab Results  Component Value Date   TRIG 148.0 06/20/2016   Lab Results  Component Value Date   CHOLHDL 4 06/20/2016   Lab Results  Component Value Date   ESRSEDRATE 25 03/12/2017   Lab Results  Component Value Date   CRP 0.1 (L) 03/12/2017   Lab Results  Component Value Date   RF <14 03/12/2017   ANA neg 03/12/17  IMPRESSION AND PLAN:  1) Musculoskeletal pain; possibly fibromyalgia.  Improved on meloxicam.  Changed rx to 7.5mg  tabs, 1-2 qd prn. If this causes sx's of gastritis or makes her GER worse, then she needs to either start otc 150mg  zantac bid scheduled OR call for rx for omeprazole 40mg  qAM.  2) SI joint dysfunction/pain: improving gradually with formal PT and home PT.  3) Subclinical hypothyroidism: plan on recheck of thyroid labs in 3 mo at next f/u.  4) Hair loss, sparse: check iron levels (she donates blood 4 times per year).  Check vit B12 level.  An After Visit Summary was printed and given to the patient.  FOLLOW UP: Return in about 3 months (around 07/07/2017) for routine chronic illness f/u.  Signed:  Crissie Sickles, MD           04/07/2017

## 2017-04-07 NOTE — Progress Notes (Signed)
Pre visit review using our clinic review tool, if applicable. No additional management support is needed unless otherwise documented below in the visit note. 

## 2017-04-08 ENCOUNTER — Ambulatory Visit: Payer: Medicare Other | Admitting: Family Medicine

## 2017-04-08 LAB — IRON: Iron: 46 ug/dL (ref 42–145)

## 2017-04-09 ENCOUNTER — Encounter: Payer: Self-pay | Admitting: Physical Therapy

## 2017-04-09 ENCOUNTER — Ambulatory Visit: Payer: Medicare Other | Admitting: Physical Therapy

## 2017-04-09 DIAGNOSIS — M545 Low back pain: Secondary | ICD-10-CM | POA: Diagnosis not present

## 2017-04-09 NOTE — Therapy (Signed)
[facility] [place], [place], [address] Phone: [phone]   Fax:  [phone]  Physical Therapy Treatment  Patient Details  Name: Barbara Munoz MRN: [id number] Date of Birth: 09-28-1951 Referring Provider: Ricardo Jericho  Encounter Date: 04/09/2017      PT End of Session - 04/09/17 0940    Visit Number 3   Number of Visits 12   Date for PT Re-Evaluation 05/29/17   PT Start Time 0904   PT Stop Time 0945   PT Time Calculation (min) 41 min   Activity Tolerance Patient tolerated treatment well   Behavior During Therapy [facility] for tasks assessed/performed      Past Medical History:  Diagnosis Date  . Anxiety and depression 09/08/2011  . Female bladder prolapse   . GERD (gastroesophageal reflux disease) 09/05/2011  . History of Clostridium difficile infection 2007  . History of cyst of breast    multiple drained during menopause  . Hyperlipidemia   . Insomnia 09/05/2011  . Migraine    worst during menopause  . Overweight (BMI 25.0-29.9) 09/05/2011  . Seasonal allergic rhinitis   . Subclinical hypothyroidism   . Vertigo, benign positional 09/08/2011    Past Surgical History:  Procedure Laterality Date  . CARDIOVASCULAR STRESS TEST  02/15/15   Exercise myocard perfusion testing: Normal EF, normal wall motion, normal perfusion.  Poor exercise capacity (4 min).  . COLONOSCOPY  07/2006   pseudomembranous colitis  . COSMETIC SURGERY    . DEXA  06/19/09   Normal  . POSTERIOR REPAIR  2012  . TONSILLECTOMY  82  . Transvaginal ultrasound  10/2016   Normal (done for LLQ pain by Dr. Benay Pike).  . TUBAL LIGATION  1983  . vaginal hernia repair  6-12   rectocele repair    There were no vitals filed for this visit.      Subjective Assessment - 04/09/17 0908    Subjective Patient has reported some improvement thus far with meds and exercises   Patient Stated Goals Get out of pain.   Currently in Pain? Yes   Pain  Score 0-No pain  pain will increase through the day 2/10 at most   Pain Orientation Left;Right   Pain Type Acute pain   Pain Onset More than a month ago   Pain Frequency Intermittent   Aggravating Factors  prolong sitting   Pain Relieving Factors exercises movement and meds                         [facility] Adult PT Treatment/Exercise - 04/09/17 0001      Lumbar Exercises: Stretches   Single Knee to Chest Stretch 2 reps  2 each LE   Double Knee to Chest Stretch 4 reps   Lower Trunk Rotation 4 reps   Press Ups 5 reps   Piriformis Stretch 2 reps;30 seconds  bil LE   Piriformis Stretch Limitations cat/camel child pose stretches x2each     Lumbar Exercises: Supine   Ab Set 10 reps;5 seconds   Glut Set 10 reps;5 seconds   Bent Knee Raise 3 seconds  2x20   Bridge 10 reps;3 seconds   Bridge Limitations x10 with green t-band   Straight Leg Raise 3 seconds  2x10     Lumbar Exercises: Sidelying   Clam Other (comment)  2x10 eaxh LE   Hip Abduction 3 seconds  2x10 each LE  PT Short Term Goals - 03/30/17 1254      PT SHORT TERM GOAL #1   Title STG's=LTG's.           PT Long Term Goals - 04/09/17 0050      PT LONG TERM GOAL #1   Title Ind with an HEP.   Time 6   Period Weeks   Status Achieved     PT LONG TERM GOAL #2   Title Perform ADL's with pain not > 3/10.   Time 6   Period Weeks   Status On-going               Plan - 04/09/17 0951    Clinical Impression Statement Patient tolerated treatment well today. Patient had no pain pre or post treatment. Patient had good technique with all exercises and stretches and able to progress low level today. Patient reported doing HEP with no difficulty. Met LTG #1 and #2 goal ongoing due to pain defict with activities.    Rehab Potential Excellent   PT Frequency 2x / week   PT Duration 6 weeks   PT Treatment/Interventions ADLs/Self Care Home Management;Cryotherapy;Electrical  Stimulation;Ultrasound;Moist Heat;Therapeutic activities;Therapeutic exercise;Patient/family education;Manual techniques;Dry needling   PT Next Visit Plan Continue with stabilization TE; modalities and STW prn.   Consulted and Agree with Plan of Care Patient      Patient will benefit from skilled therapeutic intervention in order to improve the following deficits and impairments:  Pain, Decreased activity tolerance, Hypermobility  Visit Diagnosis: Acute right-sided low back pain, with sciatica presence unspecified     Problem List Patient Active Problem List   Diagnosis Date Noted  . Maxillary sinusitis 08/08/2015  . 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 Health maintenance examination 11/03/2013  . Preventative health care 11/09/2012  . Anxiety and depression 09/08/2011  . GERD (gastroesophageal reflux disease) 09/05/2011  . Overweight (BMI 25.0-29.9) 09/05/2011  . Insomnia 09/05/2011  . Hyperlipidemia   . Migraine     Nicoya Friel P, PTA 04/09/2017, 9:54 AM  [facility] [place], [place], [address] Phone: [phone]   Fax:  [phone]  Name: KAMERA DUBAS MRN: [id number] Date of Birth: 1951/09/14

## 2017-04-14 ENCOUNTER — Encounter: Payer: Self-pay | Admitting: Physical Therapy

## 2017-04-14 ENCOUNTER — Ambulatory Visit: Payer: Medicare Other | Admitting: Physical Therapy

## 2017-04-14 DIAGNOSIS — M545 Low back pain: Secondary | ICD-10-CM | POA: Diagnosis not present

## 2017-04-14 NOTE — Patient Instructions (Addendum)
Strengthening: Hip Abductor - Resisted    Lay on both sides and lift top knee up to ceiling while keeping feet together. Keep a draw in. Repeat _10___ times per set. Do _2___ sets per session. Do __2-3__ sessions per day.  http://orth.exer.us/688   Copyright  VHI. All rights reserved.  Strengthening: Hip Abduction (Side-Lying)    Tighten muscles on front of left/right thigh, then lift leg _5___ inches from surface, keeping knee locked. With a draw in. Repeat __10__ times per set. Do _2___ sets per session. Do __2-3__ sessions per day.  http://orth.exer.us/622   Copyright  VHI. All rights reserved.

## 2017-04-14 NOTE — Therapy (Signed)
[facility] [place], [place], [address] Phone: [phone]   Fax:  [phone]  Physical Therapy Treatment  Patient Details  Name: Barbara Munoz MRN: [id number] Date of Birth: [birth year] Referring Provider: Ricardo Jericho  Encounter Date: 04/14/2017      PT End of Session - 04/14/17 0902    Visit Number 4   Number of Visits 12   Date for PT Re-Evaluation 05/29/17   PT Start Time 0904   PT Stop Time 0947   PT Time Calculation (min) 43 min   Activity Tolerance Patient tolerated treatment well   Behavior During Therapy [facility] for tasks assessed/performed      Past Medical History:  Diagnosis Date  . Anxiety and depression 09/08/2011  . Female bladder prolapse   . GERD (gastroesophageal reflux disease) 09/05/2011  . History of Clostridium difficile infection 2007  . History of cyst of breast    multiple drained during menopause  . Hyperlipidemia   . Insomnia 09/05/2011  . Migraine    worst during menopause  . Overweight (BMI 25.0-29.9) 09/05/2011  . Seasonal allergic rhinitis   . Subclinical hypothyroidism   . Vertigo, benign positional 09/08/2011    Past Surgical History:  Procedure Laterality Date  . CARDIOVASCULAR STRESS TEST  02/15/15   Exercise myocard perfusion testing: Normal EF, normal wall motion, normal perfusion.  Poor exercise capacity (4 min).  . COLONOSCOPY  07/2006   pseudomembranous colitis  . COSMETIC SURGERY    . DEXA  06/19/09   Normal  . POSTERIOR REPAIR  2012  . TONSILLECTOMY  82  . Transvaginal ultrasound  10/2016   Normal (done for LLQ pain by Dr. Benay Pike).  . TUBAL LIGATION  1983  . vaginal hernia repair  6-12   rectocele repair    There were no vitals filed for this visit.      Subjective Assessment - 04/14/17 0902    Subjective Reports increased activity with finishing bathroom cabinets and not being as faithful with Meloxicam.    Patient Stated Goals Get out of pain.    Currently in Pain? Yes   Pain Score 3    Pain Location Back   Pain Orientation Right;Left   Pain Descriptors / Indicators Discomfort   Pain Type Acute pain   Pain Onset More than a month ago            [facility] PT Assessment - 04/14/17 0001      Assessment   Medical Diagnosis SIJ dysfunction.     Precautions   Precautions None     Restrictions   Weight Bearing Restrictions No                     [facility] Adult PT Treatment/Exercise - 04/14/17 0001      Lumbar Exercises: Stretches   Single Knee to Chest Stretch 1 rep;30 seconds   Double Knee to Chest Stretch 2 reps;30 seconds   Piriformis Stretch 2 reps;30 seconds   Piriformis Stretch Limitations Child's pose x2 reps      Lumbar Exercises: Standing   Other Standing Lumbar Exercises 4# ball step outs x15 reps each     Lumbar Exercises: Supine   Bridge 20 reps;2 seconds     Lumbar Exercises: Sidelying   Clam 15 reps   Hip Abduction 15 reps     Lumbar Exercises: Prone   Straight Leg Raise 20 reps   Other Prone Lumbar Exercises Pelvic press with knee flexion x20  reps each   Other Prone Lumbar Exercises Pevlic press with mule kick x20 reps each                  PT Short Term Goals - 03/30/17 1254      PT SHORT TERM GOAL #1   Title STG's=LTG's.           PT Long Term Goals - 04/09/17 4827      PT LONG TERM GOAL #1   Title Ind with an HEP.   Time 6   Period Weeks   Status Achieved     PT LONG TERM GOAL #2   Title Perform ADL's with pain not > 3/10.   Time 6   Period Weeks   Status On-going               Plan - 04/14/17 0950    Clinical Impression Statement Patient tolerated today's treatment fairly well although she has recently experienced increased pain due to increased activity as well as intermittant Meloxicam use. Patient taken through various low back/hip stretching without complaint although great stretch felt with piriformis stretch today. Patient guided through  lumbar stabilization exercises in various positions without resistance at this time with focus on completion of exercise with core activation. Patient provided new HEP with sidelying exercises and with VCs that when clam becomes easier to add green theraband she was given previously.   Rehab Potential Excellent   PT Frequency 2x / week   PT Duration 6 weeks   PT Treatment/Interventions ADLs/Self Care Home Management;Cryotherapy;Electrical Stimulation;Ultrasound;Moist Heat;Therapeutic activities;Therapeutic exercise;Patient/family education;Manual techniques;Dry needling   PT Next Visit Plan Continue with stabilization TE; modalities and STW prn.   PT Home Exercise Plan bridge with band, TA series, pelvic press series   Consulted and Agree with Plan of Care Patient      Patient will benefit from skilled therapeutic intervention in order to improve the following deficits and impairments:  Pain, Decreased activity tolerance, Hypermobility  Visit Diagnosis: Acute right-sided low back pain, with sciatica presence unspecified     Problem List Patient Active Problem List   Diagnosis Date Noted  . Maxillary sinusitis 08/08/2015  . 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 Health maintenance examination 11/03/2013  . Preventative health care 11/09/2012  . Anxiety and depression 09/08/2011  . GERD (gastroesophageal reflux disease) 09/05/2011  . Overweight (BMI 25.0-29.9) 09/05/2011  . Insomnia 09/05/2011  . Hyperlipidemia   . Migraine     Wynelle Fanny, PTA 04/14/2017, 9:56 AM  [facility] [address] [place], [place], [address] Phone: [phone]   Fax:  [phone]  Name: Barbara Munoz MRN: [id number] Date of Birth: 03/09/1951

## 2017-04-21 ENCOUNTER — Ambulatory Visit: Payer: Medicare Other | Attending: Family Medicine | Admitting: Physical Therapy

## 2017-04-21 ENCOUNTER — Encounter: Payer: Self-pay | Admitting: Physical Therapy

## 2017-04-21 DIAGNOSIS — M545 Low back pain: Secondary | ICD-10-CM | POA: Diagnosis present

## 2017-04-21 NOTE — Therapy (Signed)
[facility] [place], [place], [address] Phone: [phone]   Fax:  [phone]  Physical Therapy Treatment  Patient Details  Name: Barbara Munoz MRN: [id number] Date of Birth: [birth year] Referring Provider: Ricardo Jericho  Encounter Date: 04/21/2017      PT End of Session - 04/21/17 0857    Visit Number 5   Number of Visits 12   Date for PT Re-Evaluation 05/29/17   PT Start Time 0900   PT Stop Time 0945   PT Time Calculation (min) 45 min   Activity Tolerance Patient tolerated treatment well   Behavior During Therapy [facility] for tasks assessed/performed      Past Medical History:  Diagnosis Date  . Anxiety and depression 09/08/2011  . Female bladder prolapse   . GERD (gastroesophageal reflux disease) 09/05/2011  . History of Clostridium difficile infection 2007  . History of cyst of breast    multiple drained during menopause  . Hyperlipidemia   . Insomnia 09/05/2011  . Migraine    worst during menopause  . Overweight (BMI 25.0-29.9) 09/05/2011  . Seasonal allergic rhinitis   . Subclinical hypothyroidism   . Vertigo, benign positional 09/08/2011    Past Surgical History:  Procedure Laterality Date  . CARDIOVASCULAR STRESS TEST  02/15/15   Exercise myocard perfusion testing: Normal EF, normal wall motion, normal perfusion.  Poor exercise capacity (4 min).  . COLONOSCOPY  07/2006   pseudomembranous colitis  . COSMETIC SURGERY    . DEXA  06/19/09   Normal  . POSTERIOR REPAIR  2012  . TONSILLECTOMY  82  . Transvaginal ultrasound  10/2016   Normal (done for LLQ pain by Dr. Benay Pike).  . TUBAL LIGATION  1983  . vaginal hernia repair  6-12   rectocele repair    There were no vitals filed for this visit.      Subjective Assessment - 04/21/17 0857    Subjective Reports that she worked a lot yesterday and states that was so tired by the end of working that she didn't stretch.   Patient Stated Goals Get out of  pain.   Currently in Pain? Yes   Pain Score 3    Pain Location Back   Pain Orientation Lower   Pain Descriptors / Indicators Sore   Pain Type Acute pain   Pain Onset More than a month ago   Pain Frequency Intermittent            [facility] PT Assessment - 04/21/17 0001      Assessment   Medical Diagnosis SIJ dysfunction.     Precautions   Precautions None     Restrictions   Weight Bearing Restrictions No                     [facility] Adult PT Treatment/Exercise - 04/21/17 0001      Lumbar Exercises: Stretches   Active Hamstring Stretch 3 reps;20 seconds   Double Knee to Chest Stretch 3 reps;30 seconds   Piriformis Stretch 2 reps;60 seconds   Piriformis Stretch Limitations cat/camel x10 reps, child poses throughout treatment     Lumbar Exercises: Standing   Row Strengthening;Both;15 reps  x BLE   Row Limitations with staggered stance and pink XTS     Lumbar Exercises: Supine   Bridge 15 reps;5 seconds   Other Supine Lumbar Exercises Lumbar rotation core pull up x15 reps each   Other Supine Lumbar Exercises SL bridge x5 reps each, Marching (up,up,down,  down) x15 reps     Lumbar Exercises: Sidelying   Clam 15 reps   Clam Limitations BLE   Hip Abduction 15 reps   Hip Abduction Limitations BLE     Lumbar Exercises: Prone   Straight Leg Raise 15 reps   Other Prone Lumbar Exercises Pelvic press with knee flexion x15 reps each   Other Prone Lumbar Exercises Pevlic press with mule kick x15 reps each                  PT Short Term Goals - 03/30/17 1254      PT SHORT TERM GOAL #1   Title STG's=LTG's.           PT Long Term Goals - 04/09/17 6629      PT LONG TERM GOAL #1   Title Ind with an HEP.   Time 6   Period Weeks   Status Achieved     PT LONG TERM GOAL #2   Title Perform ADL's with pain not > 3/10.   Time 6   Period Weeks   Status On-going               Plan - 04/21/17 0949    Clinical Impression Statement Patient  tolerated today's treatment well even as she arrived with low level low back soreness. Patient instructed through stretches as she reported inability to stretch following work yesterday. Patient guided through with VCs and demo of all core/lumbar stability and strengthening exercises with reports of discomfort with bridging activities. Patient very compliant regarding core activation and pelvic press during treatment. Patient experienced fatigue but overall feeling better than arrival upon end of treatment per patient report. Patient encouraged to continue HEP as much as possible and to limit overactivity to limit pain.   Rehab Potential Excellent   PT Frequency 2x / week   PT Duration 6 weeks   PT Treatment/Interventions ADLs/Self Care Home Management;Cryotherapy;Electrical Stimulation;Ultrasound;Moist Heat;Therapeutic activities;Therapeutic exercise;Patient/family education;Manual techniques;Dry needling   PT Next Visit Plan Continue with stabilization TE; modalities and STW prn.   PT Home Exercise Plan bridge with band, TA series, pelvic press series   Consulted and Agree with Plan of Care Patient      Patient will benefit from skilled therapeutic intervention in order to improve the following deficits and impairments:  Pain, Decreased activity tolerance, Hypermobility  Visit Diagnosis: Acute right-sided low back pain, with sciatica presence unspecified     Problem List Patient Active Problem List   Diagnosis Date Noted  . Maxillary sinusitis 08/08/2015  . 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 Health maintenance examination 11/03/2013  . Preventative health care 11/09/2012  . Anxiety and depression 09/08/2011  . GERD (gastroesophageal reflux disease) 09/05/2011  . Overweight (BMI 25.0-29.9) 09/05/2011  . Insomnia 09/05/2011  . Hyperlipidemia   . Migraine     Wynelle Fanny, PTA 04/21/2017, 9:58 AM  [facility] [address] [place], [place], [address] Phone: [phone]   Fax:  [phone]  Name: Barbara Munoz MRN: [id number] Date of Birth: 1950/12/28

## 2017-04-28 ENCOUNTER — Encounter: Payer: Self-pay | Admitting: Physical Therapy

## 2017-04-28 ENCOUNTER — Ambulatory Visit: Payer: Medicare Other | Admitting: Physical Therapy

## 2017-04-28 DIAGNOSIS — M545 Low back pain: Secondary | ICD-10-CM

## 2017-04-28 NOTE — Therapy (Addendum)
[facility] [place], [place], [address] Phone: [phone]   Fax:  [phone]  Physical Therapy Treatment  Patient Details  Name: Barbara Munoz MRN: [id number] Date of Birth: 24-Dec-1950 Referring Provider: Ricardo Jericho  Encounter Date: 04/28/2017      PT End of Session - 04/28/17 0903    Visit Number 6   Number of Visits 12   Date for PT Re-Evaluation 05/29/17   PT Start Time 0902   PT Stop Time 0943   PT Time Calculation (min) 41 min   Activity Tolerance Patient tolerated treatment well   Behavior During Therapy [facility] for tasks assessed/performed      Past Medical History:  Diagnosis Date  . Anxiety and depression 09/08/2011  . Female bladder prolapse   . GERD (gastroesophageal reflux disease) 09/05/2011  . History of Clostridium difficile infection 2007  . History of cyst of breast    multiple drained during menopause  . Hyperlipidemia   . Insomnia 09/05/2011  . Migraine    worst during menopause  . Overweight (BMI 25.0-29.9) 09/05/2011  . Seasonal allergic rhinitis   . Subclinical hypothyroidism   . Vertigo, benign positional 09/08/2011    Past Surgical History:  Procedure Laterality Date  . CARDIOVASCULAR STRESS TEST  02/15/15   Exercise myocard perfusion testing: Normal EF, normal wall motion, normal perfusion.  Poor exercise capacity (4 min).  . COLONOSCOPY  07/2006   pseudomembranous colitis  . COSMETIC SURGERY    . DEXA  06/19/09   Normal  . POSTERIOR REPAIR  2012  . TONSILLECTOMY  82  . Transvaginal ultrasound  10/2016   Normal (done for LLQ pain by Dr. Benay Pike).  . TUBAL LIGATION  1983  . vaginal hernia repair  6-12   rectocele repair    There were no vitals filed for this visit.      Subjective Assessment - 04/28/17 0903    Subjective Reports that she worked in her yard for 3 hours straight (raking, rolling wheelbarrow after picking up sticks, weeding in squat position). L SI pain  greater than R today per patient report. Reports that following her work in the yard yesterday that she did complete exercise list from PT.   Patient Stated Goals Get out of pain.   Currently in Pain? Yes   Pain Score 2    Pain Location Back   Pain Orientation Right;Lower   Pain Type Acute pain   Pain Onset More than a month ago            [facility] PT Assessment - 04/28/17 0001      Assessment   Medical Diagnosis SIJ dysfunction.     Precautions   Precautions None     Restrictions   Weight Bearing Restrictions No                     [facility] Adult PT Treatment/Exercise - 04/28/17 0001      Lumbar Exercises: Stretches   Active Hamstring Stretch 3 reps;20 seconds   Double Knee to Chest Stretch 3 reps;30 seconds   Piriformis Stretch 2 reps;60 seconds     Lumbar Exercises: Standing   Wall Slides 20 reps   Row Strengthening;Both;20 reps   Row Limitations with staggered stance and Pink XTS   Other Standing Lumbar Exercises 4# ball step outs x15 reps each     Lumbar Exercises: Seated   Long Arc Quad on [place] AROM;Both;1 set;15 reps   LAQ on  Ball Limitations on dynadisc with burn in hips noted   Hip Flexion on Ball AROM;Both;20 reps   Hip Flexion on Ball Limitations on dynadisc     Lumbar Exercises: Supine   Bent Knee Raise 20 reps  up/up/down/down   Bridge 15 reps;3 seconds   Other Supine Lumbar Exercises Lumbar rotation core pull up x20 reps each     Lumbar Exercises: Sidelying   Clam 20 reps   Clam Limitations BLE  B hip discomfort with L (joint) > R (muscular)   Hip Abduction 20 reps   Hip Abduction Limitations BLE     Lumbar Exercises: Prone   Straight Leg Raise 20 reps   Other Prone Lumbar Exercises Pelvic press with knee flexion x15 reps each   Other Prone Lumbar Exercises Pevlic press with mule kick x15 reps each                  PT Short Term Goals - 03/30/17 1254      PT SHORT TERM GOAL #1   Title STG's=LTG's.           PT  Long Term Goals - 04/09/17 8315      PT LONG TERM GOAL #1   Title Ind with an HEP.   Time 6   Period Weeks   Status Achieved     PT LONG TERM GOAL #2   Title Perform ADL's with pain not > 3/10.   Time 6   Period Weeks   Status On-going               Plan - 04/28/17 0944    Clinical Impression Statement Patient tolerated today's treatment well as she arrived with low level SI soreness. Stabilizations exercises completed today as well with dynadisc to promote stability and control. Patient requested to stop seated dynadisc stabilizations due to hip discomfort. Patient did experience greater L SI discomfort today during exercises. LE discomfort noted with wall slides today not LBP. Patient experienced low back feeling stretched upon end of treatment.    Rehab Potential Excellent   PT Frequency 2x / week   PT Duration 6 weeks   PT Treatment/Interventions ADLs/Self Care Home Management;Cryotherapy;Electrical Stimulation;Ultrasound;Moist Heat;Therapeutic activities;Therapeutic exercise;Patient/family education;Manual techniques;Dry needling   PT Next Visit Plan Continue with stabilization TE with dynadisc mobilizations; modalities and STW prn.   PT Home Exercise Plan bridge with band, TA series, pelvic press series   Consulted and Agree with Plan of Care Patient      Patient will benefit from skilled therapeutic intervention in order to improve the following deficits and impairments:  Pain, Decreased activity tolerance, Hypermobility  Visit Diagnosis: Acute right-sided low back pain, with sciatica presence unspecified     Problem List Patient Active Problem List   Diagnosis Date Noted  . Maxillary sinusitis 08/08/2015  . 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 Health maintenance examination 11/03/2013  . Preventative health care 11/09/2012  . Anxiety and depression 09/08/2011  . GERD (gastroesophageal reflux disease) 09/05/2011  . Overweight (BMI 25.0-29.9) 09/05/2011  .  Insomnia 09/05/2011  . Hyperlipidemia   . Migraine     Wynelle Fanny, PTA 04/28/2017, 9:54 AM  [facility] [address] [place], [place], [address] Phone: [phone]   Fax:  [phone]  Name: Barbara Munoz MRN: [id number] Date of Birth: 12/30/1950  PHYSICAL THERAPY DISCHARGE SUMMARY  Visits from Start of Care: 6.  Current functional level related to goals / functional outcomes: See above.   Remaining  deficits: Goal #2 unmet.   Education / Equipment: HEP. Plan: Patient agrees to discharge.  Patient goals were partially met. Patient is being discharged due to not returning since the last visit.  ?????         Mali Applegate MPT

## 2017-05-05 ENCOUNTER — Encounter: Payer: Medicare Other | Admitting: Physical Therapy

## 2017-05-27 ENCOUNTER — Telehealth: Payer: Self-pay | Admitting: Family Medicine

## 2017-05-27 NOTE — Telephone Encounter (Signed)
Patient requesting CB from [facility]. She has been feeling very fatigued but has not been feeling sick. She is wondering if she needs to come in to have bloodwork. Please call.

## 2017-05-27 NOTE — Telephone Encounter (Signed)
SW pt, apt made for tomorrow (05/28/17) at 10:30am.

## 2017-05-28 ENCOUNTER — Ambulatory Visit (INDEPENDENT_AMBULATORY_CARE_PROVIDER_SITE_OTHER): Payer: Medicare Other | Admitting: Family Medicine

## 2017-05-28 ENCOUNTER — Encounter: Payer: Self-pay | Admitting: Family Medicine

## 2017-05-28 VITALS — BP 125/68 | HR 67 | Temp 97.7°F | Resp 16 | Ht 60.35 in | Wt 164.0 lb

## 2017-05-28 DIAGNOSIS — E039 Hypothyroidism, unspecified: Secondary | ICD-10-CM

## 2017-05-28 DIAGNOSIS — F419 Anxiety disorder, unspecified: Secondary | ICD-10-CM

## 2017-05-28 DIAGNOSIS — K2100 Gastro-esophageal reflux disease with esophagitis, without bleeding: Secondary | ICD-10-CM

## 2017-05-28 DIAGNOSIS — M797 Fibromyalgia: Secondary | ICD-10-CM | POA: Diagnosis not present

## 2017-05-28 DIAGNOSIS — R5382 Chronic fatigue, unspecified: Secondary | ICD-10-CM | POA: Diagnosis not present

## 2017-05-28 DIAGNOSIS — F329 Major depressive disorder, single episode, unspecified: Secondary | ICD-10-CM

## 2017-05-28 DIAGNOSIS — F32A Depression, unspecified: Secondary | ICD-10-CM

## 2017-05-28 DIAGNOSIS — E038 Other specified hypothyroidism: Secondary | ICD-10-CM

## 2017-05-28 DIAGNOSIS — R790 Abnormal level of blood mineral: Secondary | ICD-10-CM

## 2017-05-28 LAB — TSH: TSH: 4.65 u[IU]/mL — ABNORMAL HIGH (ref 0.35–4.50)

## 2017-05-28 LAB — T4, FREE: Free T4: 0.73 ng/dL (ref 0.60–1.60)

## 2017-05-28 MED ORDER — PANTOPRAZOLE SODIUM 40 MG PO TBEC: 40.0000 mg | DELAYED_RELEASE_TABLET | Freq: Every day | ORAL | 6 refills | Status: DC

## 2017-05-28 NOTE — Progress Notes (Signed)
OFFICE VISIT  05/28/2017   CC:  Chief Complaint  Patient presents with  . Fatigue   HPI:    Patient is a 66 y.o. Caucasian female who presents for fatigue. Of note, she has hx of fibromyalgia sx's that we did a rheum lab w/u for: all reassuring. Her iron stores did come back low so I got her started on iron supplement about 6 weeks ago.  Has been compliant with qd-bid ferrous sulfate for the last 1 mo.  Has hx of mild subclinical hypothyroidism.  Chronically tired (waxing and waning with her chronic fibromyalgia pain x 25 yrs or so), worse the last week or two.   Has been babysitting grandchildren at the beach lately, paying more close attention to her mom's care x 2 wks, had a mild recent diarrhea illness recently.   GERD "has been a real problem".  Taking zantac 150mg  once daily--not helping.  She is trying to keep herself busy but finds it very difficult.   Pain has been pretty well controlled on daily meloxicam.  Has been doing some home PT. Tried going off meloxicam for a few days last week and her bad pain returned.  Her GER did not improve any off of the meloxicam.  Regarding depression: admits this past winter her depression was a bit longer lasting than previous years. She absolutely refuses to admit to current depression.  When asked directly, she dances around the question completely.  PHQ 9 today = 13.  Past Medical History:  Diagnosis Date  . Anxiety and depression 09/08/2011  . Female bladder prolapse   . GERD (gastroesophageal reflux disease) 09/05/2011  . History of Clostridium difficile infection 2007  . History of cyst of breast    multiple drained during menopause  . Hyperlipidemia   . Insomnia 09/05/2011  . Migraine    worst during menopause  . Overweight (BMI 25.0-29.9) 09/05/2011  . Seasonal allergic rhinitis   . Subclinical hypothyroidism   . Vertigo, benign positional 09/08/2011    Past Surgical History:  Procedure Laterality Date  . CARDIOVASCULAR  STRESS TEST  02/15/15   Exercise myocard perfusion testing: Normal EF, normal wall motion, normal perfusion.  Poor exercise capacity (4 min).  . COLONOSCOPY  07/2006   pseudomembranous colitis  . COSMETIC SURGERY    . DEXA  06/19/09   Normal  . POSTERIOR REPAIR  2012  . TONSILLECTOMY  82  . Transvaginal ultrasound  10/2016   Normal (done for LLQ pain by Dr. Benay Pike).  . TUBAL LIGATION  1983  . vaginal hernia repair  6-12   rectocele repair    Outpatient Medications Prior to Visit  Medication Sig Dispense Refill  . acetaminophen (TYLENOL) 500 MG tablet Take 500 mg by mouth every 6 (six) hours as needed.      [name] Kitchen albuterol (PROVENTIL HFA;VENTOLIN HFA) 108 (90 BASE) MCG/ACT inhaler Inhale 2 puffs into the lungs every 6 (six) hours as needed for wheezing or shortness of breath. 1 Inhaler 2  . ALPRAZolam (XANAX) 0.25 MG tablet Take 1 tablet (0.25 mg total) by mouth 2 (two) times daily as needed for anxiety. 180 tablet 1  . aspirin 81 MG tablet Take 81 mg by mouth daily.      . Calcium Carbonate-Vitamin D (CALCIUM 600+D) 600-400 MG-UNIT per tablet Take 1 tablet by mouth daily.      [name] Kitchen loratadine (CLARITIN) 10 MG tablet Take 10 mg by mouth daily as needed. Spring and fall     . meloxicam (  MOBIC) 7.5 MG tablet 1-2 tabs po qd prn musculoskeletal pain 60 tablet 6  . Multiple Vitamin (MULTIVITAMIN) tablet Take 1 tablet by mouth daily.      . Omega-3 Fatty Acids (FISH OIL PO) Take by mouth daily.    . ranitidine (ZANTAC) 150 MG tablet Take 150 mg by mouth at bedtime.    . sertraline (ZOLOFT) 100 MG tablet Take 1.5 tablets (150 mg total) by mouth daily. 135 tablet 3  . simvastatin (ZOCOR) 40 MG tablet TAKE 1 TABLET (40 MG TOTAL) BY MOUTH AT BEDTIME. 90 tablet 3   No facility-administered medications prior to visit.     No Known Allergies  ROS As per HPI  PE: Blood pressure 125/68, pulse 67, temperature 97.7 F (36.5 C), temperature source Oral, resp. rate 16, height 5' 0.35" (1.533 m),  weight 164 lb (74.4 kg), SpO2 98 %. Gen: Alert, well appearing.  Patient is oriented to person, place, time, and situation. AFFECT: pleasant, lucid thought and speech. GMW:NUUV: no injection, icteris, swelling, or exudate.  EOMI, PERRLA. Mouth: lips without lesion/swelling.  Oral mucosa pink and moist. Oropharynx without erythema, exudate, or swelling.  Neck - No masses or thyromegaly or limitation in range of motion CV: RRR, no m/r/g.   LUNGS: CTA bilat, nonlabored resps, good aeration in all lung fields. EXT: no clubbing, cyanosis, or edema.  Neuro: CN 2-12 intact bilaterally, strength 5/5 in proximal and distal upper extremities and lower extremities bilaterally.    No tremor.  FNF normal bilat. No ataxia.    LABS:  Lab Results  Component Value Date   TSH 8.20 (H) 03/12/2017   Lab Results  Component Value Date   WBC 6.6 03/12/2017   HGB 12.9 03/12/2017   HCT 39.0 03/12/2017   MCV 90.4 03/12/2017   PLT 196.0 03/12/2017   Lab Results  Component Value Date   CREATININE 0.71 03/12/2017   BUN 14 03/12/2017   NA 141 03/12/2017   K 4.2 03/12/2017   CL 105 03/12/2017   CO2 29 03/12/2017   Lab Results  Component Value Date   ALT 26 03/12/2017   AST 26 03/12/2017   ALKPHOS 57 03/12/2017   BILITOT 0.3 03/12/2017   Lab Results  Component Value Date   CHOL 190 06/20/2016   Lab Results  Component Value Date   HDL 49.00 06/20/2016   Lab Results  Component Value Date   LDLCALC 111 (H) 06/20/2016   Lab Results  Component Value Date   TRIG 148.0 06/20/2016   Lab Results  Component Value Date   CHOLHDL 4 06/20/2016   Lab Results  Component Value Date   ESRSEDRATE 25 03/12/2017   Lab Results  Component Value Date   CRP 0.1 (L) 03/12/2017   Lab Results  Component Value Date   RF <14 03/12/2017   Lab Results  Component Value Date   TSH 8.20 (H) 03/12/2017   Free T4 and T3 were normal 02/2017.  ANA neg 03/12/17.  CK total normal 02/2017.  Lab Results   Component Value Date   IRON 46 04/07/2017   IRON 53 04/07/2017   TIBC 410 04/07/2017   FERRITIN 6.6 (L) 04/07/2017   Lab Results  Component Value Date   [id number] 742 04/07/2017   IMPRESSION AND PLAN:  1) Worsening chronic fatigue: she has had this pattern for decades per her report today. She has fibromyalgia and I feel like this is explained by this condition. However, with hx of recent mild subclinical hypothyroidism,  I will recheck thyroid panel and TPO antibody. If TSH same or worse than last check, will start low dose levothyroxine trial. If TSH not same or worse, then will start wellbutrin xl 150mg  qd to augment her zoloft 150mg  qd to see if inadequately treated depression is contributing to her current feeling. Will wait until next f/u visit in 6 wks to recheck iron studies + retic+CBC to see if her stores are coming back up appropriately.  2) GERD, uncontrolled on zantac 150mg  qd. Start pantoprazole 40mg  qd and continue zantac 150mg  qhs. Continue best efforts at eating GER-friendly diet.  An After Visit Summary was printed and given to the patient.  FOLLOW UP: Return in about 6 weeks (around 07/09/2017) for f/u fatigue.  Signed:  Crissie Sickles, MD           05/28/2017

## 2017-05-29 ENCOUNTER — Other Ambulatory Visit: Payer: Self-pay | Admitting: Family Medicine

## 2017-05-29 LAB — T3: T3, Total: 92 ng/dL (ref 76–181)

## 2017-05-29 LAB — THYROID PEROXIDASE ANTIBODY: Thyroperoxidase Ab SerPl-aCnc: 507 [IU]/mL — ABNORMAL HIGH

## 2017-05-29 MED ORDER — BUPROPION HCL ER (XL) 150 MG PO TB24: 150.0000 mg | ORAL_TABLET | Freq: Every day | ORAL | 1 refills | Status: DC

## 2017-06-08 ENCOUNTER — Encounter: Payer: Self-pay | Admitting: Nurse Practitioner

## 2017-06-23 ENCOUNTER — Other Ambulatory Visit: Payer: Self-pay | Admitting: Family Medicine

## 2017-06-23 ENCOUNTER — Other Ambulatory Visit: Payer: Medicare Other

## 2017-06-25 ENCOUNTER — Other Ambulatory Visit: Payer: Medicare Other

## 2017-06-29 ENCOUNTER — Encounter: Payer: Self-pay | Admitting: Nurse Practitioner

## 2017-06-29 ENCOUNTER — Ambulatory Visit (INDEPENDENT_AMBULATORY_CARE_PROVIDER_SITE_OTHER): Payer: Medicare Other | Admitting: Nurse Practitioner

## 2017-06-29 VITALS — BP 112/60 | HR 68 | Wt 161.0 lb

## 2017-06-29 DIAGNOSIS — Z1211 Encounter for screening for malignant neoplasm of colon: Secondary | ICD-10-CM

## 2017-06-29 DIAGNOSIS — D509 Iron deficiency anemia, unspecified: Secondary | ICD-10-CM | POA: Diagnosis not present

## 2017-06-29 DIAGNOSIS — K219 Gastro-esophageal reflux disease without esophagitis: Secondary | ICD-10-CM

## 2017-06-29 MED ORDER — NA SULFATE-K SULFATE-MG SULF 17.5-3.13-1.6 GM/177ML PO SOLN: ORAL | 0 refills | Status: DC

## 2017-06-29 NOTE — Progress Notes (Signed)
HPI: Patient is 66 year old female known remotely to Dr. Ardis Hughs. In August 2007 she had a colonoscopy for diarrhea which was probably secondary to C-diff.   Patient is here for evaluation of GERD and to discuss colon cancer screening. She gives a 4-5 year history of GERD with heartburn but mainly reflux. Initially patient took Zantac as needed but has needed it on a daily basis for for the last 3 years. Recently Protonix was added for breakthrough symptoms. Within a few doses of Protonix her reflux symptoms have completely resolved. She has noticed that the reflux is positional, worse when lying flat or bending over.  In March her hemoglobin was noted to have dropped to 12.9 , about 1 g from baseline. Ferritin  6. She had no GI bleeding. No hematuria, no vaginal bleeding or epistaxis. She was donating blood about 4 times a year however. PCP started her on iron in March, she is due for follow-up labs at PCPs office tomorrow.  No other GI complaints or general medical complaints. She has occasional diarrhea but otherwise her stools are normal. No abdominal pain, weight stable.   Past Medical History:  Diagnosis Date  . Anxiety and depression 09/08/2011  . Female bladder prolapse   . GERD (gastroesophageal reflux disease) 09/05/2011  . History of Clostridium difficile infection 2007  . History of cyst of breast    multiple drained during menopause  . Hyperlipidemia   . Insomnia 09/05/2011  . Migraine    worst during menopause  . Overweight (BMI 25.0-29.9) 09/05/2011  . Seasonal allergic rhinitis   . Subclinical hypothyroidism   . Vertigo, benign positional 09/08/2011     Past Surgical History:  Procedure Laterality Date  . CARDIOVASCULAR STRESS TEST  02/15/15   Exercise myocard perfusion testing: Normal EF, normal wall motion, normal perfusion.  Poor exercise capacity (4 min).  . COLONOSCOPY  07/2006   pseudomembranous colitis  . COSMETIC SURGERY    . DEXA  06/19/09   Normal  .  POSTERIOR REPAIR  2012  . TONSILLECTOMY  82  . Transvaginal ultrasound  10/2016   Normal (done for LLQ pain by Dr. Benay Pike).  . TUBAL LIGATION  1983  . vaginal hernia repair  6-12   rectocele repair   Family History  Problem Relation Age of Onset  . Diabetes Mother 50       type 2  . Aneurysm Father        aortic  . Cancer Father        prostate and lung/ smoker  . Pernicious anemia Father   . Hyperlipidemia Sister   . Hypertension Sister   . Diabetes Sister        type 2  . Other Sister        degenerative disc disease  . Thyroid disease Sister   . Cancer Sister        breast  cancer s/p dbl mastectomy doing well  . Hyperlipidemia Brother   . Allergies Brother   . Diabetes Daughter        type 1  . Breast cancer Maternal Grandmother   . Heart attack Maternal Grandfather   . Heart disease Paternal Grandmother        CHF  . Pernicious anemia Paternal Grandmother   . Kidney disease Paternal Grandfather   . Stroke Paternal Grandfather   . Hypertension Sister   . Hyperlipidemia Sister   . Diabetes Sister        type  2  . Allergies Daughter   . Breast cancer Sister    Social History  Substance Use Topics  . Smoking status: Never Smoker  . Smokeless tobacco: Never Used     Comment: smoked for about 6 months  . Alcohol use No     Comment: rarely   Current Outpatient Prescriptions  Medication Sig Dispense Refill  . ALPRAZolam (XANAX) 0.25 MG tablet Take 1 tablet (0.25 mg total) by mouth 2 (two) times daily as needed for anxiety. 180 tablet 1  . aspirin 81 MG tablet Take 81 mg by mouth daily.      [name] Kitchen buPROPion (WELLBUTRIN XL) 150 MG 24 hr tablet Take 1 tablet (150 mg total) by mouth daily. 30 tablet 1  . Calcium Carbonate-Vitamin D (CALCIUM 600+D) 600-400 MG-UNIT per tablet Take 1 tablet by mouth daily.      . Ferrous Sulfate (IRON) 325 (65 Fe) MG TABS Take by mouth 2 (two) times daily.    [name] Kitchen loratadine (CLARITIN) 10 MG tablet Take 10 mg by mouth daily as needed.  Spring and fall     . meloxicam (MOBIC) 7.5 MG tablet 1-2 tabs po qd prn musculoskeletal pain 60 tablet 6  . Multiple Vitamin (MULTIVITAMIN) tablet Take 1 tablet by mouth daily.      . Omega-3 Fatty Acids (FISH OIL PO) Take by mouth daily.    . pantoprazole (PROTONIX) 40 MG tablet Take 1 tablet (40 mg total) by mouth daily. 30 tablet 6  . ranitidine (ZANTAC) 150 MG tablet Take 150 mg by mouth at bedtime.    . sertraline (ZOLOFT) 100 MG tablet Take 1.5 tablets (150 mg total) by mouth daily. 135 tablet 3  . simvastatin (ZOCOR) 40 MG tablet TAKE 1 TABLET (40 MG TOTAL) BY MOUTH AT BEDTIME. 90 tablet 3   No current facility-administered medications for this visit.    No Known Allergies   Review of Systems: All systems reviewed and negative except where noted in HPI.    Physical Exam: BP 112/60   Pulse 68   Wt 161 lb (73 kg)   BMI 31.08 kg/m  Constitutional:  Well-developed, white female in no acute distress. Psychiatric: Normal mood and affect. Behavior is normal. EENT: Pupils normal.  Conjunctivae are normal. No scleral icterus. Neck supple.  Cardiovascular: Normal rate, regular rhythm. No edema Pulmonary/chest: Effort normal and breath sounds normal. No wheezing, rales or rhonchi. Abdominal: Soft, nondistended. Nontender. Bowel sounds active throughout. There are no masses palpable. No hepatomegaly. Lymphadenopathy: No cervical adenopathy noted. Neurological: Alert and oriented to person place and time. Skin: Skin is warm and dry. No rashes noted.   ASSESSMENT AND PLAN:  66. 66 yo female with 4-5 year history of GERD. Refractory reflux / some heartburn on daily Zantac, now total resolution after recent addition of Protonix.  -Extensive conversation about antireflux precautions. GERD literature given -Recommend she try Protonix in the morning before breakfast. If breakthrough symptoms she can take Zantac at night. If she has recurrent symptoms despite anti-reflux measures and  medication then will need further evaluation. Right now this clearly seems to be GERD  2. Colon cancer screening.  -Patient will be scheduled for a screening colonoscopy with possible polypectomy.  The risks and benefits of the procedure were discussed and the patient agrees to proceed.  -Hold iron for 1 week prior to her procedure  3. Iron deficiency. Ferrin 6. No overt bleeding. Suspect secondary to frequent blood donation (four times a year). No longer donating blood.  On iron pills now. PCP monitoring labs   Tye Savoy, NP  06/29/2017, 10:31 AM  Cc:  McGowen, Adrian Blackwater, MD

## 2017-06-29 NOTE — Patient Instructions (Signed)
If you are age 66 or older, your body mass index should be between 23-30. Your Body mass index is 31.08 kg/m. If this is out of the aforementioned range listed, please consider follow up with your Primary Care Provider.  If you are age 61 or younger, your body mass index should be between 19-25. Your Body mass index is 31.08 kg/m. If this is out of the aformentioned range listed, please consider follow up with your Primary Care Provider.   You have been scheduled for a colonoscopy. Please follow written instructions given to you at your visit today.  Please pick up your prep supplies at the pharmacy within the next 1-3 days. If you use inhalers (even only as needed), please bring them with you on the day of your procedure. Your physician has requested that you go to www.startemmi.com and enter the access code given to you at your visit today. This web site gives a general overview about your procedure. However, you should still follow specific instructions given to you by our office regarding your preparation for the procedure.  STOP Zantac.  Take Protonix 30 minutes before breakfast.  You have been given GERD Literature.  Thank you for choosing me and [facility].   Tye Savoy, NP

## 2017-06-30 ENCOUNTER — Ambulatory Visit (INDEPENDENT_AMBULATORY_CARE_PROVIDER_SITE_OTHER): Payer: Medicare Other | Admitting: Family Medicine

## 2017-06-30 ENCOUNTER — Encounter: Payer: Self-pay | Admitting: Family Medicine

## 2017-06-30 VITALS — BP 122/70 | HR 71 | Temp 98.0°F | Resp 16 | Ht 64.0 in | Wt 161.5 lb

## 2017-06-30 DIAGNOSIS — Z0000 Encounter for general adult medical examination without abnormal findings: Secondary | ICD-10-CM

## 2017-06-30 DIAGNOSIS — E038 Other specified hypothyroidism: Secondary | ICD-10-CM

## 2017-06-30 DIAGNOSIS — E611 Iron deficiency: Secondary | ICD-10-CM | POA: Diagnosis not present

## 2017-06-30 DIAGNOSIS — E039 Hypothyroidism, unspecified: Secondary | ICD-10-CM | POA: Diagnosis not present

## 2017-06-30 DIAGNOSIS — F324 Major depressive disorder, single episode, in partial remission: Secondary | ICD-10-CM

## 2017-06-30 DIAGNOSIS — Z23 Encounter for immunization: Secondary | ICD-10-CM | POA: Diagnosis not present

## 2017-06-30 LAB — CBC WITH DIFFERENTIAL/PLATELET
BASOS ABS: 0 10*3/uL (ref 0.0–0.1)
BASOS PCT: 0.9 % (ref 0.0–3.0)
Eosinophils Absolute: 0.1 10*3/uL (ref 0.0–0.7)
Eosinophils Relative: 2.4 % (ref 0.0–5.0)
HEMATOCRIT: 43.4 % (ref 36.0–46.0)
Hemoglobin: 14.5 g/dL (ref 12.0–15.0)
LYMPHS PCT: 36.7 % (ref 12.0–46.0)
Lymphs Abs: 2 10*3/uL (ref 0.7–4.0)
MCHC: 33.4 g/dL (ref 30.0–36.0)
MCV: 91.2 fl (ref 78.0–100.0)
MONOS PCT: 8.7 % (ref 3.0–12.0)
Monocytes Absolute: 0.5 10*3/uL (ref 0.1–1.0)
NEUTROS ABS: 2.8 10*3/uL (ref 1.4–7.7)
Neutrophils Relative %: 51.3 % (ref 43.0–77.0)
PLATELETS: 177 10*3/uL (ref 150.0–400.0)
RBC: 4.77 Mil/uL (ref 3.87–5.11)
RDW: 15 % (ref 11.5–15.5)
WBC: 5.4 10*3/uL (ref 4.0–10.5)

## 2017-06-30 LAB — COMPREHENSIVE METABOLIC PANEL
ALT: 28 U/L (ref 0–35)
AST: 28 U/L (ref 0–37)
Albumin: 4.6 g/dL (ref 3.5–5.2)
Alkaline Phosphatase: 56 U/L (ref 39–117)
BUN: 16 mg/dL (ref 6–23)
CALCIUM: 9.7 mg/dL (ref 8.4–10.5)
CHLORIDE: 105 meq/L (ref 96–112)
CO2: 26 meq/L (ref 19–32)
CREATININE: 0.76 mg/dL (ref 0.40–1.20)
GFR: 81.02 mL/min (ref >60.00)
Glucose, Bld: 102 mg/dL — ABNORMAL HIGH (ref 70–99)
POTASSIUM: 4.2 meq/L (ref 3.5–5.1)
Sodium: 141 mEq/L (ref 135–145)
Total Bilirubin: 0.5 mg/dL (ref 0.2–1.2)
Total Protein: 7.3 g/dL (ref 6.0–8.3)

## 2017-06-30 LAB — LIPID PANEL
CHOL/HDL RATIO: 3
Cholesterol: 179 mg/dL (ref 0–200)
HDL: 51.8 mg/dL (ref >39.00)
LDL CALC: 100 mg/dL — AB (ref 0–99)
NonHDL: 127.37
TRIGLYCERIDES: 136 mg/dL (ref 0.0–149.0)
VLDL: 27.2 mg/dL (ref 0.0–40.0)

## 2017-06-30 LAB — T4, FREE: Free T4: 0.66 ng/dL (ref 0.60–1.60)

## 2017-06-30 LAB — TSH: TSH: 3.6 u[IU]/mL (ref 0.35–4.50)

## 2017-06-30 LAB — IRON: Iron: 105 ug/dL (ref 42–145)

## 2017-06-30 LAB — FERRITIN: Ferritin: 35 ng/mL (ref 10.0–291.0)

## 2017-06-30 MED ORDER — BUPROPION HCL ER (XL) 150 MG PO TB24: 150.0000 mg | ORAL_TABLET | Freq: Every day | ORAL | 3 refills | Status: DC

## 2017-06-30 MED ORDER — ZOSTER VAC RECOMB ADJUVANTED 50 MCG/0.5ML IM SUSR: INTRAMUSCULAR | 1 refills | Status: DC

## 2017-06-30 NOTE — Patient Instructions (Signed)

## 2017-06-30 NOTE — Progress Notes (Signed)
I agree with the above note, plan 

## 2017-06-30 NOTE — Progress Notes (Signed)
Office Note 06/30/2017  CC:  Chief Complaint  Patient presents with  . Annual Exam    Pt is fasting.     HPI:  Barbara Munoz is a 66 y.o. White female who is here for annual health maintenance exam. Dr. Cletis Media is her GYN: pap and mammogram are UTD.  Taking iron 1-2 times a day--hx of iron deficiency.  Since starting wellbutrin xL she is improved regarding mood.  Having some excessive startle response the last week or so, but no other side effects.  Eyes: exam 1 yr ago. Dental: utd on preventatives.  Past Medical History:  Diagnosis Date  . Anxiety and depression 09/08/2011  . Female bladder prolapse   . GERD (gastroesophageal reflux disease) 09/05/2011   Improved signif with PPI  . History of Clostridium difficile infection 2007   colonic changes c/w with this dx seen on colonoscopy 2007---sx's responded to flagyl.  [name] Kitchen History of cyst of breast    multiple drained during menopause  . Hyperlipidemia   . Insomnia 09/05/2011  . Migraine    worst during menopause  . Overweight (BMI 25.0-29.9) 09/05/2011  . Seasonal allergic rhinitis   . Subclinical hypothyroidism   . Vertigo, benign positional 09/08/2011    Past Surgical History:  Procedure Laterality Date  . CARDIOVASCULAR STRESS TEST  02/15/15   Exercise myocard perfusion testing: Normal EF, normal wall motion, normal perfusion.  Poor exercise capacity (4 min).  . COLONOSCOPY  07/2006   pseudomembranous colitis, o/w normal. (Dr. Ardis Hughs)  . COSMETIC SURGERY    . DEXA  06/19/09   Normal  . POSTERIOR REPAIR  2012  . TONSILLECTOMY  82  . Transvaginal ultrasound  10/2016   Normal (done for LLQ pain by Dr. Benay Pike).  . TUBAL LIGATION  1983  . vaginal hernia repair  6-12   rectocele repair    Family History  Problem Relation Age of Onset  . Diabetes Mother 44       type 2  . Aneurysm Father        aortic  . Cancer Father        prostate and lung/ smoker  . Pernicious anemia Father   . Hyperlipidemia Sister    . Hypertension Sister   . Diabetes Sister        type 2  . Other Sister        degenerative disc disease  . Thyroid disease Sister   . Cancer Sister        breast  cancer s/p dbl mastectomy doing well  . Hyperlipidemia Brother   . Allergies Brother   . Diabetes Daughter        type 1  . Breast cancer Maternal Grandmother   . Heart attack Maternal Grandfather   . Heart disease Paternal Grandmother        CHF  . Pernicious anemia Paternal Grandmother   . Kidney disease Paternal Grandfather   . Stroke Paternal Grandfather   . Hypertension Sister   . Hyperlipidemia Sister   . Diabetes Sister        type 2  . Allergies Daughter   . Breast cancer Sister     Social History   Social History  . Marital status: Married    Spouse name: N/A  . Number of children: N/A  . Years of education: N/A   Occupational History  . Not on file.   Social History Main Topics  . Smoking status: Never Smoker  . Smokeless tobacco: Never  Used     Comment: smoked for about 6 months  . Alcohol use No     Comment: rarely  . Drug use: No  . Sexual activity: Yes    Birth control/ protection: Post-menopausal   Other Topics Concern  . Not on file   Social History Narrative   Married.   Nonsmoker.    Outpatient Medications Prior to Visit  Medication Sig Dispense Refill  . ALPRAZolam (XANAX) 0.25 MG tablet Take 1 tablet (0.25 mg total) by mouth 2 (two) times daily as needed for anxiety. 180 tablet 1  . aspirin 81 MG tablet Take 81 mg by mouth daily.      . Calcium Carbonate-Vitamin D (CALCIUM 600+D) 600-400 MG-UNIT per tablet Take 1 tablet by mouth daily.      . Ferrous Sulfate (IRON) 325 (65 Fe) MG TABS Take by mouth 2 (two) times daily.    [name] Kitchen loratadine (CLARITIN) 10 MG tablet Take 10 mg by mouth daily as needed. Spring and fall     . meloxicam (MOBIC) 7.5 MG tablet 1-2 tabs po qd prn musculoskeletal pain 60 tablet 6  . Multiple Vitamin (MULTIVITAMIN) tablet Take 1 tablet by mouth daily.       . Omega-3 Fatty Acids (FISH OIL PO) Take by mouth daily.    . pantoprazole (PROTONIX) 40 MG tablet Take 1 tablet (40 mg total) by mouth daily. 30 tablet 6  . sertraline (ZOLOFT) 100 MG tablet Take 1.5 tablets (150 mg total) by mouth daily. 135 tablet 3  . simvastatin (ZOCOR) 40 MG tablet TAKE 1 TABLET (40 MG TOTAL) BY MOUTH AT BEDTIME. 90 tablet 3  . buPROPion (WELLBUTRIN XL) 150 MG 24 hr tablet Take 1 tablet (150 mg total) by mouth daily. 30 tablet 1  . Na Sulfate-K Sulfate-Mg Sulf 17.5-3.13-1.6 GM/180ML SOLN Suprep-Use as directed 354 mL 0   No facility-administered medications prior to visit.     No Known Allergies  ROS Review of Systems  Constitutional: Negative for appetite change, chills, fatigue and fever.  HENT: Negative for congestion, dental problem, ear pain and sore throat.   Eyes: Negative for discharge, redness and visual disturbance.  Respiratory: Negative for cough, chest tightness, shortness of breath and wheezing.   Cardiovascular: Negative for chest pain, palpitations and leg swelling.  Gastrointestinal: Negative for abdominal pain, blood in stool, diarrhea, nausea and vomiting.  Genitourinary: Negative for difficulty urinating, dysuria, flank pain, frequency, hematuria and urgency.  Musculoskeletal: Negative for arthralgias, back pain, joint swelling, myalgias and neck stiffness.  Skin: Negative for pallor and rash.  Neurological: Negative for dizziness, speech difficulty, weakness and headaches.  Hematological: Negative for adenopathy. Does not bruise/bleed easily.  Psychiatric/Behavioral: Negative for confusion and sleep disturbance. The patient is not nervous/anxious.     PE; Blood pressure 122/70, pulse 71, temperature 98 F (36.7 C), temperature source Oral, resp. rate 16, height 5\' 4"  (1.626 m), weight 161 lb 8 oz (73.3 kg), SpO2 98 %. Body mass index is 27.72 kg/m.  Gen: Alert, well appearing.  Patient is oriented to person, place, time, and  situation. AFFECT: pleasant, lucid thought and speech. ENT: Ears: EACs clear, normal epithelium.  TMs with good light reflex and landmarks bilaterally.  Eyes: no injection, icteris, swelling, or exudate.  EOMI, PERRLA. Nose: no drainage or turbinate edema/swelling.  No injection or focal lesion.  Mouth: lips without lesion/swelling.  Oral mucosa pink and moist.  Dentition intact and without obvious caries or gingival swelling.  Oropharynx without erythema, exudate,  or swelling.  Neck: supple/nontender.  No LAD, mass, or TM.  Carotid pulses 2+ bilaterally, without bruits. CV: RRR, no m/r/g.   LUNGS: CTA bilat, nonlabored resps, good aeration in all lung fields. ABD: soft, NT, ND, BS normal.  No hepatospenomegaly or mass.  No bruits. EXT: no clubbing, cyanosis, or edema.  Musculoskeletal: no joint swelling, erythema, warmth, or tenderness.  ROM of all joints intact. Skin - no sores or suspicious lesions or rashes or color changes   Pertinent labs:  None today  Lab Results  Component Value Date   WBC 6.6 03/12/2017   HGB 12.9 03/12/2017   HCT 39.0 03/12/2017   MCV 90.4 03/12/2017   PLT 196.0 03/12/2017   Lab Results  Component Value Date   IRON 46 04/07/2017   IRON 53 04/07/2017   TIBC 410 04/07/2017   FERRITIN 6.6 (L) 04/07/2017     ASSESSMENT AND PLAN:   1) Iron deficiency w/out anemia: has been on oral iron for about 3 mo. We'll recheck iron labs and CBC today.  This is most likely due to frequent blood donations. Colonoscopy scheduled for 08/25/17.  2) Depression: improved on wellbutrin 150 mg xl started about 4-6 wks ago.  She is feeling increased energy level, which was a primary symptom she was having.  Continue wellbutrin and zoloft at current doses.  3) Health maintenance exam: Reviewed age and gender appropriate health maintenance issues (prudent diet, regular exercise, health risks of tobacco and excessive alcohol, use of seatbelts, fire alarms in home, use of  sunscreen).  Also reviewed age and gender appropriate health screening as well as vaccine recommendations. Vaccines: needs prevnar 13.  Shingrix discussed: rx given today. Colon cancer screening: due for colonoscopy-she recently saw her GI MD and is scheduled for this procedure on 08/25/17. Osteoporosis screening: 07/2012 was her last DEXA and this one was normal and it was done via her GYN--this will be done via her GYN. Cervical ca and breast ca screening: UTD and done via her GYN.  An After Visit Summary was printed and given to the patient.  FOLLOW UP:  Return in about 3 months (around 09/30/2017) for f/u dep/iron.  Signed:  Crissie Sickles, MD           06/30/2017

## 2017-07-01 LAB — IRON AND TIBC
%SAT: 30 % (ref 11–50)
IRON: 95 ug/dL (ref 45–160)
TIBC: 321 ug/dL (ref 250–450)
UIBC: 226 ug/dL

## 2017-07-01 LAB — T3: T3 TOTAL: 112.4 ng/dL (ref 76–181)

## 2017-07-27 ENCOUNTER — Other Ambulatory Visit: Payer: Self-pay | Admitting: Family Medicine

## 2017-07-27 NOTE — Telephone Encounter (Signed)
Simvastatin and sertraline RFs eRx'd.

## 2017-08-11 ENCOUNTER — Encounter: Payer: Self-pay | Admitting: Gastroenterology

## 2017-08-22 DIAGNOSIS — Z860101 Personal history of adenomatous and serrated colon polyps: Secondary | ICD-10-CM

## 2017-08-22 DIAGNOSIS — Z86010 Personal history of colonic polyps: Secondary | ICD-10-CM

## 2017-08-22 HISTORY — DX: Personal history of adenomatous and serrated colon polyps: Z86.0101

## 2017-08-22 HISTORY — DX: Personal history of colonic polyps: Z86.010

## 2017-08-25 ENCOUNTER — Ambulatory Visit (AMBULATORY_SURGERY_CENTER): Payer: Medicare Other | Admitting: Gastroenterology

## 2017-08-25 ENCOUNTER — Encounter: Payer: Self-pay | Admitting: Gastroenterology

## 2017-08-25 VITALS — BP 118/59 | HR 64 | Temp 96.6°F | Resp 16 | Ht 64.0 in | Wt 161.0 lb

## 2017-08-25 DIAGNOSIS — Z1211 Encounter for screening for malignant neoplasm of colon: Secondary | ICD-10-CM | POA: Diagnosis not present

## 2017-08-25 DIAGNOSIS — D122 Benign neoplasm of ascending colon: Secondary | ICD-10-CM

## 2017-08-25 DIAGNOSIS — D128 Benign neoplasm of rectum: Secondary | ICD-10-CM

## 2017-08-25 DIAGNOSIS — D129 Benign neoplasm of anus and anal canal: Secondary | ICD-10-CM

## 2017-08-25 DIAGNOSIS — K5730 Diverticulosis of large intestine without perforation or abscess without bleeding: Secondary | ICD-10-CM

## 2017-08-25 LAB — HM COLONOSCOPY

## 2017-08-25 MED ORDER — SODIUM CHLORIDE 0.9 % IV SOLN: 500.0000 mL | INTRAVENOUS | Status: DC

## 2017-08-25 NOTE — Progress Notes (Signed)
Spontaneous respirations throughout. VSS. Resting comfortably. To PACU on room air. Report to  RN. 

## 2017-08-25 NOTE — Patient Instructions (Signed)
HANDOUTS GIVEN: POLYPS AND DIVERTICULOSIS   YOU HAD AN ENDOSCOPIC PROCEDURE TODAY AT THE [facility]:   Refer to the procedure report that was given to you for any specific questions about what was found during the examination.  If the procedure report does not answer your questions, please call your gastroenterologist to clarify.  If you requested that your care partner not be given the details of your procedure findings, then the procedure report has been included in a sealed envelope for you to review at your convenience later.  YOU SHOULD EXPECT: Some feelings of bloating in the abdomen. Passage of more gas than usual.  Walking can help get rid of the air that was put into your GI tract during the procedure and reduce the bloating. If you had a lower endoscopy (such as a colonoscopy or flexible sigmoidoscopy) you may notice spotting of blood in your stool or on the toilet paper. If you underwent a bowel prep for your procedure, you may not have a normal bowel movement for a few days.  Please Note:  You might notice some irritation and congestion in your nose or some drainage.  This is from the oxygen used during your procedure.  There is no need for concern and it should clear up in a day or so.  SYMPTOMS TO REPORT IMMEDIATELY:   Following lower endoscopy (colonoscopy or flexible sigmoidoscopy):  Excessive amounts of blood in the stool  Significant tenderness or worsening of abdominal pains  Swelling of the abdomen that is new, acute  Fever of 100F or higher  For urgent or emergent issues, a gastroenterologist can be reached at any hour by calling [phone].   DIET:  We do recommend a small meal at first, but then you may proceed to your regular diet.  Drink plenty of fluids but you should avoid alcoholic beverages for 24 hours.  ACTIVITY:  You should plan to take it easy for the rest of today and you should NOT DRIVE or use heavy machinery until tomorrow (because of the  sedation medicines used during the test).    FOLLOW UP: Our staff will call the number listed on your records the next business day following your procedure to check on you and address any questions or concerns that you may have regarding the information given to you following your procedure. If we do not reach you, we will leave a message.  However, if you are feeling well and you are not experiencing any problems, there is no need to return our call.  We will assume that you have returned to your regular daily activities without incident.  If any biopsies were taken you will be contacted by phone or by letter within the next 1-3 weeks.  Please call us at [phone] if you have not heard about the biopsies in 3 weeks.    SIGNATURES/CONFIDENTIALITY: You and/or your care partner have signed paperwork which will be entered into your electronic medical record.  These signatures attest to the fact that that the information above on your After Visit Summary has been reviewed and is understood.  Full responsibility of the confidentiality of this discharge information lies with you and/or your care-partner. 

## 2017-08-25 NOTE — Op Note (Signed)
[facility] Patient Name: Barbara Munoz Procedure Date: 08/25/2017 9:21 AM MRN: [id number] Endoscopist: Milus Banister , MD Age: 66 Referring MD:  Date of Birth: 1951/11/06 Gender: Female Account #: [id number] Procedure:                Colonoscopy Indications:              Screening for colorectal malignant neoplasm Medicines:                Monitored Anesthesia Care Procedure:                Pre-Anesthesia Assessment:                           - Prior to the procedure, a History and Physical                            was performed, and patient medications and                            allergies were reviewed. The patient's tolerance of                            previous anesthesia was also reviewed. The risks                            and benefits of the procedure and the sedation                            options and risks were discussed with the patient.                            All questions were answered, and informed consent                            was obtained. Prior Anticoagulants: The patient has                            taken no previous anticoagulant or antiplatelet                            agents. ASA Grade Assessment: II - A patient with                            mild systemic disease. After reviewing the risks                            and benefits, the patient was deemed in                            satisfactory condition to undergo the procedure.                           After obtaining informed consent, the colonoscope  was passed under direct vision. Throughout the                            procedure, the patient's blood pressure, pulse, and                            oxygen saturations were monitored continuously. The                            Model CF-HQ190L [id number]) scope was introduced                            through the anus and advanced to the the cecum,                            identified by  appendiceal orifice and ileocecal                            valve. The colonoscopy was performed without                            difficulty. The patient tolerated the procedure                            well. The quality of the bowel preparation was                            good. The ileocecal valve, appendiceal orifice, and                            rectum were photographed. Scope In: 9:29:17 AM Scope Out: 9:44:13 AM Scope Withdrawal Time: 0 hours 11 minutes 58 seconds  Total Procedure Duration: 0 hours 14 minutes 56 seconds  Findings:                 Three sessile polyps were found in the rectum and                            ascending colon. The polyps were 3 to 6 mm in size.                            These polyps were removed with a cold snare.                            Resection and retrieval were complete.                           A 1 mm polyp was found in the ascending colon. The                            polyp was sessile. The polyp was removed with a  cold biopsy forceps. Resection and retrieval were                            complete.                           A few small-mouthed diverticula were found in the                            left colon.                           The exam was otherwise without abnormality on                            direct and retroflexion views. Complications:            No immediate complications. Estimated blood loss:                            None. Estimated Blood Loss:     Estimated blood loss: none. Impression:               - Three 3 to 6 mm polyps in the rectum and in the                            ascending colon, removed with a cold snare.                            Resected and retrieved.                           - One 1 mm polyp in the ascending colon, removed                            with a cold biopsy forceps. Resected and retrieved.                           - Diverticulosis in the left  colon.                           - The examination was otherwise normal on direct                            and retroflexion views. Recommendation:           - Patient has a contact number available for                            emergencies. The signs and symptoms of potential                            delayed complications were discussed with the                            patient. Return to normal activities tomorrow.  Written discharge instructions were provided to the                            patient.                           - Resume previous diet.                           - Continue present medications.                           You will receive a letter within 2-3 weeks with the                            pathology results and my final recommendations.                           If the polyp(s) is proven to be 'pre-cancerous' on                            pathology, you will need repeat colonoscopy in 3-5                            years. If the polyp(s) is NOT 'precancerous' on                            pathology then you should repeat colon cancer                            screening in 10 years with colonoscopy without need                            for colon cancer screening by any method prior to                            then (including stool testing). Milus Banister, MD 08/25/2017 9:47:40 AM This report has been signed electronically.

## 2017-08-25 NOTE — Progress Notes (Signed)
Called to room to assist during endoscopic procedure.  Patient ID and intended procedure confirmed with present staff. Received instructions for my participation in the procedure from the performing physician.  

## 2017-08-26 ENCOUNTER — Telehealth: Payer: Self-pay | Admitting: *Deleted

## 2017-08-26 NOTE — Telephone Encounter (Signed)
Message left

## 2017-08-26 NOTE — Telephone Encounter (Signed)
  Follow up Call-  Call back number 08/25/2017  Post procedure Call Back phone  # [phone]  Permission to leave phone message Yes  Some recent data might be hidden     Patient questions:  Do you have a fever, pain , or abdominal swelling? No. Pain Score  0 *  Have you tolerated food without any problems? Yes.    Have you been able to return to your normal activities? Yes.    Do you have any questions about your discharge instructions: Diet   No. Medications  No. Follow up visit  No.  Do you have questions or concerns about your Care? No.  Actions: * If pain score is 4 or above: No action needed, pain <4.

## 2017-08-27 ENCOUNTER — Encounter: Payer: Self-pay | Admitting: Family Medicine

## 2017-08-30 ENCOUNTER — Encounter: Payer: Self-pay | Admitting: Gastroenterology

## 2017-09-01 ENCOUNTER — Other Ambulatory Visit: Payer: Self-pay | Admitting: *Deleted

## 2017-09-01 MED ORDER — ALPRAZOLAM 0.25 MG PO TABS: 0.2500 mg | ORAL_TABLET | Freq: Two times a day (BID) | ORAL | 1 refills | Status: DC | PRN

## 2017-09-01 NOTE — Telephone Encounter (Signed)
[facility]  RF request for alprazolam LOV: 06/30/17 Next ov: None Last written: 08/29/16 #180 w/ 1RF  Please advise. Thanks.

## 2017-09-01 NOTE — Telephone Encounter (Signed)
Rx faxed

## 2017-09-23 ENCOUNTER — Ambulatory Visit (INDEPENDENT_AMBULATORY_CARE_PROVIDER_SITE_OTHER): Payer: Medicare Other

## 2017-09-23 DIAGNOSIS — Z23 Encounter for immunization: Secondary | ICD-10-CM

## 2017-10-01 ENCOUNTER — Ambulatory Visit: Payer: Medicare Other | Admitting: Family Medicine

## 2017-11-02 ENCOUNTER — Ambulatory Visit: Payer: Medicare Other | Admitting: Family Medicine

## 2017-11-05 ENCOUNTER — Encounter: Payer: Self-pay | Admitting: Family Medicine

## 2017-11-05 ENCOUNTER — Other Ambulatory Visit: Payer: Self-pay

## 2017-11-05 ENCOUNTER — Ambulatory Visit: Payer: Medicare Other | Admitting: Family Medicine

## 2017-11-05 VITALS — BP 122/84 | HR 68 | Temp 97.8°F | Resp 16 | Ht 64.0 in | Wt 160.5 lb

## 2017-11-05 DIAGNOSIS — E611 Iron deficiency: Secondary | ICD-10-CM | POA: Diagnosis not present

## 2017-11-05 DIAGNOSIS — F3341 Major depressive disorder, recurrent, in partial remission: Secondary | ICD-10-CM | POA: Diagnosis not present

## 2017-11-05 DIAGNOSIS — F411 Generalized anxiety disorder: Secondary | ICD-10-CM | POA: Diagnosis not present

## 2017-11-05 LAB — FERRITIN: Ferritin: 60.4 ng/mL (ref 10.0–291.0)

## 2017-11-05 LAB — IRON: IRON: 88 ug/dL (ref 42–145)

## 2017-11-05 NOTE — Progress Notes (Signed)
OFFICE VISIT  11/05/2017   CC:  Chief Complaint  Patient presents with  . Follow-up    Depression and Iron     HPI:    Patient is a 66 y.o. Caucasian female who presents for f/u depression and anxiety. Iron stores low, w/out anemia 03/2017, improved upon lab recheck 3 mo ago, recommended she continue iron supplement to get stores higher.  Has been compliant with oral iron.  Overall she feels better physically since being on this and getting iron up.  Mood/anxiety: "things are pretty good".  She keeps very busy, occ gets worn out and has to crash and rest. No anhedonia.  Good motivation to get out and do things.   Taking alprazolam every night, also sertraline 150 mg qd.  ROS: no melena, hematochezia, dizziness, or pale skin or jaundice  Past Medical History:  Diagnosis Date  . Anxiety and depression 09/08/2011  . Female bladder prolapse   . GERD (gastroesophageal reflux disease) 09/05/2011   Improved signif with PPI  . History of adenomatous polyp of colon 08/2017   Recall 3-5 yrs  . History of Clostridium difficile infection 2007   colonic changes c/w with this dx seen on colonoscopy 2007---sx's responded to flagyl.  [name] Kitchen History of cyst of breast    multiple drained during menopause  . Hyperlipidemia   . Insomnia 09/05/2011  . Migraine    worst during menopause  . Overweight (BMI 25.0-29.9) 09/05/2011  . Seasonal allergic rhinitis   . Subclinical hypothyroidism   . Vertigo, benign positional 09/08/2011    Past Surgical History:  Procedure Laterality Date  . CARDIOVASCULAR STRESS TEST  02/15/15   Exercise myocard perfusion testing: Normal EF, normal wall motion, normal perfusion.  Poor exercise capacity (4 min).  . COLONOSCOPY  07/2006; 08/25/17   2007: pseudomembranous colitis, o/w normal. (Dr. Ardis Hughs).  08/2017: adenomatous polyp--recall 3-5 yrs  . COSMETIC SURGERY    . DEXA  06/19/09   Normal  . POSTERIOR REPAIR  2012  . TONSILLECTOMY  82  . Transvaginal ultrasound   10/2016   Normal (done for LLQ pain by Dr. Benay Pike).  . TUBAL LIGATION  1983  . vaginal hernia repair  6-12   rectocele repair    Outpatient Medications Prior to Visit  Medication Sig Dispense Refill  . ALPRAZolam (XANAX) 0.25 MG tablet Take 1 tablet (0.25 mg total) by mouth 2 (two) times daily as needed for anxiety. 180 tablet 1  . aspirin 81 MG tablet Take 81 mg by mouth daily.      . Calcium Carbonate-Vitamin D (CALCIUM 600+D) 600-400 MG-UNIT per tablet Take 1 tablet by mouth daily.      . Ferrous Sulfate (IRON) 325 (65 Fe) MG TABS Take by mouth 2 (two) times daily.    [name] Kitchen loratadine (CLARITIN) 10 MG tablet Take 10 mg by mouth daily as needed. Spring and fall     . meloxicam (MOBIC) 7.5 MG tablet 1-2 tabs po qd prn musculoskeletal pain 60 tablet 6  . Multiple Vitamin (MULTIVITAMIN) tablet Take 1 tablet by mouth daily.      . Omega-3 Fatty Acids (FISH OIL PO) Take by mouth daily.    . pantoprazole (PROTONIX) 40 MG tablet Take 1 tablet (40 mg total) by mouth daily. (Patient taking differently: Take 40 mg daily as needed by mouth. ) 30 tablet 6  . sertraline (ZOLOFT) 100 MG tablet TAKE 1.5 TABLETS (150 MG TOTAL) BY MOUTH DAILY. 135 tablet 3  . simvastatin (ZOCOR) 40 MG  tablet TAKE 1 TABLET (40 MG TOTAL) BY MOUTH AT BEDTIME. 90 tablet 3  . buPROPion (WELLBUTRIN XL) 150 MG 24 hr tablet Take 1 tablet (150 mg total) by mouth daily. (Patient not taking: Reported on 08/25/2017) 30 tablet 3  . 0.9 %  sodium chloride infusion      No facility-administered medications prior to visit.     No Known Allergies  ROS As per HPI  PE: Blood pressure 122/84, pulse 68, temperature 97.8 F (36.6 C), temperature source Oral, resp. rate 16, height 5\' 4"  (1.626 m), weight 160 lb 8 oz (72.8 kg), SpO2 95 %. Gen: Alert, well appearing.  Patient is oriented to person, place, time, and situation. AFFECT: pleasant, lucid thought and speech.   LABS:  Lab Results  Component Value Date   WBC 5.4 06/30/2017    HGB 14.5 06/30/2017   HCT 43.4 06/30/2017   MCV 91.2 06/30/2017   PLT 177.0 06/30/2017   Lab Results  Component Value Date   IRON 88 11/05/2017   TIBC 321 06/30/2017   FERRITIN 60.4 11/05/2017   Lab Results  Component Value Date   TSH 3.60 06/30/2017   Lab Results  Component Value Date   CHOL 179 06/30/2017   HDL 51.80 06/30/2017   LDLCALC 100 (H) 06/30/2017   LDLDIRECT 110.6 10/26/2013   TRIG 136.0 06/30/2017   CHOLHDL 3 06/30/2017    IMPRESSION AND PLAN:  1) MDD, mild, remission, + GAD: all stable. Continue sertraline 150mg  qd and alprazolam qhs and may also take in daytime prn. No new alpraz rx needed today.  2) Iron deficiency, w/out anemia:  Pt's iron stores were replenishing at last check 4 mo ago. She has continued FeSO4 325mg  qd-bid since that time. Iron panel recheck today.  An After Visit Summary was printed and given to the patient.  FOLLOW UP: Return in about 6 months (around 05/05/2018) for annual CPE (fasting).  Signed:  Crissie Sickles, MD           11/05/2017

## 2017-11-06 LAB — IRON AND TIBC
IRON SATURATION: 26 % (ref 15–55)
IRON: 80 ug/dL (ref 27–139)
TIBC: 302 ug/dL (ref 250–450)
UIBC: 222 ug/dL (ref 118–369)

## 2018-01-15 LAB — HM PAP SMEAR: HM PAP: NEGATIVE

## 2018-01-18 ENCOUNTER — Encounter: Payer: Self-pay | Admitting: Family Medicine

## 2018-05-07 ENCOUNTER — Other Ambulatory Visit: Payer: Self-pay | Admitting: Family Medicine

## 2018-05-10 NOTE — Telephone Encounter (Signed)
[facility]  RF request for meloxicam LOV: 11/05/17 Next ov: 07/01/18 Last written: 04/07/17 #60 w/ 6RF  Please advise. Thanks.

## 2018-05-26 ENCOUNTER — Encounter: Payer: Self-pay | Admitting: Family Medicine

## 2018-05-26 ENCOUNTER — Ambulatory Visit: Payer: Medicare Other | Admitting: Family Medicine

## 2018-05-26 VITALS — BP 103/70 | HR 74 | Temp 97.9°F | Resp 16 | Ht 64.0 in | Wt 160.4 lb

## 2018-05-26 DIAGNOSIS — J0100 Acute maxillary sinusitis, unspecified: Secondary | ICD-10-CM | POA: Diagnosis not present

## 2018-05-26 DIAGNOSIS — J180 Bronchopneumonia, unspecified organism: Secondary | ICD-10-CM | POA: Diagnosis not present

## 2018-05-26 MED ORDER — PREDNISONE 20 MG PO TABS: ORAL_TABLET | ORAL | 0 refills | Status: DC

## 2018-05-26 MED ORDER — CEFDINIR 300 MG PO CAPS: ORAL_CAPSULE | ORAL | 0 refills | Status: DC

## 2018-05-26 MED ORDER — HYDROCODONE-HOMATROPINE 5-1.5 MG/5ML PO SYRP: ORAL_SOLUTION | ORAL | 0 refills | Status: DC

## 2018-05-26 NOTE — Progress Notes (Signed)
OFFICE VISIT  05/26/2018   CC:  Chief Complaint  Patient presents with  . Cough    congestion, fever?, sweats and chills    HPI:    Patient is a 67 y.o. Caucasian female who presents for respiratory symptoms. Onset 6 d/a, runny nose, congestion, PND, cough.  Fatigue and HA's.  Coughing worsening. No wheezing.  No signif chest tightness.  Feeling subjective f/c last 24h. Exposed to two people a few days prior to onset of her sx's--they had same sx's as her. Zyrtec and tylenol used, but no cough med.  Chest rattle.  Not particularly productive cough much. No n/v/d/rash.  Past Medical History:  Diagnosis Date  . Anxiety and depression 09/08/2011  . Female bladder prolapse   . GERD (gastroesophageal reflux disease) 09/05/2011   Improved signif with PPI  . History of adenomatous polyp of colon 08/2017   Recall 3-5 yrs  . History of Clostridium difficile infection 2007   colonic changes c/w with this dx seen on colonoscopy 2007---sx's responded to flagyl.  [name] Kitchen History of cyst of breast    multiple drained during menopause  . Hyperlipidemia   . Insomnia 09/05/2011  . Migraine    worst during menopause  . Overweight (BMI 25.0-29.9) 09/05/2011  . Seasonal allergic rhinitis   . Subclinical hypothyroidism   . Vertigo, benign positional 09/08/2011    Past Surgical History:  Procedure Laterality Date  . CARDIOVASCULAR STRESS TEST  02/15/15   Exercise myocard perfusion testing: Normal EF, normal wall motion, normal perfusion.  Poor exercise capacity (4 min).  . COLONOSCOPY  07/2006; 08/25/17   2007: pseudomembranous colitis, o/w normal. (Dr. Ardis Hughs).  08/2017: adenomatous polyp--recall 3-5 yrs  . COSMETIC SURGERY    . DEXA  06/19/09   Normal  . POSTERIOR REPAIR  2012  . TONSILLECTOMY  82  . Transvaginal ultrasound  10/2016   Normal (done for LLQ pain by Dr. Benay Pike).  . TUBAL LIGATION  1983  . vaginal hernia repair  6-12   rectocele repair    Outpatient Medications Prior to  Visit  Medication Sig Dispense Refill  . ALPRAZolam (XANAX) 0.25 MG tablet Take 1 tablet (0.25 mg total) by mouth 2 (two) times daily as needed for anxiety. 180 tablet 1  . aspirin 81 MG tablet Take 81 mg by mouth daily.      . Calcium Carbonate-Vitamin D (CALCIUM 600+D) 600-400 MG-UNIT per tablet Take 1 tablet by mouth daily.      . Ferrous Sulfate (IRON) 325 (65 Fe) MG TABS Take by mouth 2 (two) times daily.    [name] Kitchen loratadine (CLARITIN) 10 MG tablet Take 10 mg by mouth daily as needed. Spring and fall     . meloxicam (MOBIC) 7.5 MG tablet TAKE 1 TO 2 TABLETS EVERY DAY AS NEEDED FOR MUSCULOSKELETAL PAIN 60 tablet 5  . Multiple Vitamin (MULTIVITAMIN) tablet Take 1 tablet by mouth daily.      . Omega-3 Fatty Acids (FISH OIL PO) Take by mouth daily.    . pantoprazole (PROTONIX) 40 MG tablet Take 1 tablet (40 mg total) by mouth daily. (Patient taking differently: Take 40 mg daily as needed by mouth. ) 30 tablet 6  . sertraline (ZOLOFT) 100 MG tablet TAKE 1.5 TABLETS (150 MG TOTAL) BY MOUTH DAILY. 135 tablet 3  . simvastatin (ZOCOR) 40 MG tablet TAKE 1 TABLET (40 MG TOTAL) BY MOUTH AT BEDTIME. 90 tablet 3   No facility-administered medications prior to visit.     No Known  Allergies  ROS As per HPI  PE: Blood pressure 103/70, pulse 74, temperature 97.9 F (36.6 C), temperature source Oral, resp. rate 16, height 5\' 4"  (1.626 m), weight 160 lb 6 oz (72.7 kg), SpO2 96 %. VS: noted--normal. Gen: alert, NAD, NONTOXIC APPEARING. HEENT: eyes without injection, drainage, or swelling.  Ears: EACs clear, TMs with normal light reflex and landmarks.  Nose: Clear rhinorrhea, with some dried, crusty exudate adherent to mildly injected mucosa.  No purulent d/c.  +R paranasal sinus TTP.  No facial swelling.  Throat and mouth without focal lesion.  No pharyngial swelling, erythema, or exudate.   Neck: supple, no LAD.   LUNGS: CTA bilat, nonlabored resps.  Exp phase mildly prolonged. CV: RRR, no m/r/g. EXT: no  c/c/e SKIN: no rash  LABS:    Chemistry      Component Value Date/Time   NA 141 06/30/2017 1034   K 4.2 06/30/2017 1034   CL 105 06/30/2017 1034   CO2 26 06/30/2017 1034   BUN 16 06/30/2017 1034   CREATININE 0.76 06/30/2017 1034      Component Value Date/Time   CALCIUM 9.7 06/30/2017 1034   ALKPHOS 56 06/30/2017 1034   AST 28 06/30/2017 1034   ALT 28 06/30/2017 1034   BILITOT 0.5 06/30/2017 1034      IMPRESSION AND PLAN:  Acute sinusitis and acute bronchopneumonia: Cefdinir 300 mg bid x 10d. Prednisone 40mg  qd x 5d. Hycodan syrup: 1-2 tsp bid prn cough, #120 ml. Hydrate, rest. Signs/symptoms to call or return for were reviewed and pt expressed understanding.  An After Visit Summary was printed and given to the patient.  FOLLOW UP: Return if symptoms worsen or fail to improve.  Signed:  Crissie Sickles, MD           05/26/2018

## 2018-06-21 DIAGNOSIS — R7301 Impaired fasting glucose: Secondary | ICD-10-CM

## 2018-06-21 HISTORY — DX: Impaired fasting glucose: R73.01

## 2018-07-01 ENCOUNTER — Encounter: Payer: Self-pay | Admitting: Family Medicine

## 2018-07-01 ENCOUNTER — Ambulatory Visit: Payer: Medicare Other | Admitting: Family Medicine

## 2018-07-01 VITALS — BP 95/65 | HR 75 | Temp 98.0°F | Resp 16 | Ht 64.0 in | Wt 156.4 lb

## 2018-07-01 DIAGNOSIS — E7800 Pure hypercholesterolemia, unspecified: Secondary | ICD-10-CM | POA: Diagnosis not present

## 2018-07-01 DIAGNOSIS — R7301 Impaired fasting glucose: Secondary | ICD-10-CM

## 2018-07-01 DIAGNOSIS — Z23 Encounter for immunization: Secondary | ICD-10-CM

## 2018-07-01 DIAGNOSIS — Z0000 Encounter for general adult medical examination without abnormal findings: Secondary | ICD-10-CM

## 2018-07-01 DIAGNOSIS — E038 Other specified hypothyroidism: Secondary | ICD-10-CM

## 2018-07-01 DIAGNOSIS — E039 Hypothyroidism, unspecified: Secondary | ICD-10-CM | POA: Diagnosis not present

## 2018-07-01 LAB — CBC WITH DIFFERENTIAL/PLATELET
BASOS ABS: 0.1 10*3/uL (ref 0.0–0.1)
Basophils Relative: 0.8 % (ref 0.0–3.0)
EOS ABS: 0.1 10*3/uL (ref 0.0–0.7)
Eosinophils Relative: 1.6 % (ref 0.0–5.0)
HEMATOCRIT: 43.7 % (ref 36.0–46.0)
HEMOGLOBIN: 14.6 g/dL (ref 12.0–15.0)
LYMPHS PCT: 42.9 % (ref 12.0–46.0)
Lymphs Abs: 2.6 10*3/uL (ref 0.7–4.0)
MCHC: 33.4 g/dL (ref 30.0–36.0)
MCV: 93.9 fl (ref 78.0–100.0)
Monocytes Absolute: 0.5 10*3/uL (ref 0.1–1.0)
Monocytes Relative: 8.4 % (ref 3.0–12.0)
NEUTROS ABS: 2.8 10*3/uL (ref 1.4–7.7)
Neutrophils Relative %: 46.3 % (ref 43.0–77.0)
Platelets: 180 10*3/uL (ref 150.0–400.0)
RBC: 4.66 Mil/uL (ref 3.87–5.11)
RDW: 13.1 % (ref 11.5–15.5)
WBC: 6.1 10*3/uL (ref 4.0–10.5)

## 2018-07-01 LAB — LIPID PANEL
Cholesterol: 195 mg/dL (ref 0–200)
HDL: 52.6 mg/dL (ref >39.00)
LDL CALC: 108 mg/dL — AB (ref 0–99)
NonHDL: 142.03
TRIGLYCERIDES: 168 mg/dL — AB (ref 0.0–149.0)
Total CHOL/HDL Ratio: 4
VLDL: 33.6 mg/dL (ref 0.0–40.0)

## 2018-07-01 LAB — COMPREHENSIVE METABOLIC PANEL
ALT: 23 U/L (ref 0–35)
AST: 27 U/L (ref 0–37)
Albumin: 4.6 g/dL (ref 3.5–5.2)
Alkaline Phosphatase: 51 U/L (ref 39–117)
BILIRUBIN TOTAL: 0.4 mg/dL (ref 0.2–1.2)
BUN: 17 mg/dL (ref 6–23)
CHLORIDE: 106 meq/L (ref 96–112)
CO2: 28 mEq/L (ref 19–32)
CREATININE: 0.81 mg/dL (ref 0.40–1.20)
Calcium: 9.9 mg/dL (ref 8.4–10.5)
GFR: 75.05 mL/min (ref >60.00)
Glucose, Bld: 98 mg/dL (ref 70–99)
Potassium: 4.5 mEq/L (ref 3.5–5.1)
SODIUM: 142 meq/L (ref 135–145)
Total Protein: 7.2 g/dL (ref 6.0–8.3)

## 2018-07-01 LAB — HEMOGLOBIN A1C: HEMOGLOBIN A1C: 5.6 % (ref 4.6–6.5)

## 2018-07-01 LAB — TSH: TSH: 3.58 u[IU]/mL (ref 0.35–4.50)

## 2018-07-01 MED ORDER — ZOSTER VAC RECOMB ADJUVANTED 50 MCG/0.5ML IM SUSR: 0.5000 mL | Freq: Once | INTRAMUSCULAR | 1 refills | Status: AC

## 2018-07-01 NOTE — Progress Notes (Signed)
Office Note 07/01/2018  CC:  Chief Complaint  Patient presents with  . Annual Exam    Pt is fasting.    HPI:  Barbara Munoz is a 67 y.o. White female who is here for annual health maintenance exam. GYN MD is Dr. Shanon Ace be following up with Dr. Cletis Media this fall.  Anxiety: uses xanax prn to supplement her sertraline. Has been using xanax a couple times a month, uses it nightly for stress related insomnia.  She checks glucose occ at home and it is 106 up to 121. She has not changed her diet in response to this.  Denies polyuria or polydipsia.    Past Medical History:  Diagnosis Date  . Anxiety and depression 09/08/2011  . Female bladder prolapse   . GERD (gastroesophageal reflux disease) 09/05/2011   Improved signif with PPI  . History of adenomatous polyp of colon 08/2017   Recall 3-5 yrs  . History of Clostridium difficile infection 2007   colonic changes c/w with this dx seen on colonoscopy 2007---sx's responded to flagyl.  [name] Kitchen History of cyst of breast    multiple drained during menopause  . Hyperlipidemia   . Insomnia 09/05/2011  . Migraine    worst during menopause  . Overweight (BMI 25.0-29.9) 09/05/2011  . Seasonal allergic rhinitis   . Subclinical hypothyroidism   . Vertigo, benign positional 09/08/2011    Past Surgical History:  Procedure Laterality Date  . CARDIOVASCULAR STRESS TEST  02/15/15   Exercise myocard perfusion testing: Normal EF, normal wall motion, normal perfusion.  Poor exercise capacity (4 min).  . COLONOSCOPY  07/2006; 08/25/17   2007: pseudomembranous colitis, o/w normal. (Dr. Ardis Hughs).  08/2017: adenomatous polyp--recall 3-5 yrs  . COSMETIC SURGERY    . DEXA  06/19/09   Normal  . POSTERIOR REPAIR  2012  . TONSILLECTOMY  82  . Transvaginal ultrasound  10/2016   Normal (done for LLQ pain by Dr. Benay Pike).  . TUBAL LIGATION  1983  . vaginal hernia repair  6-12   rectocele repair    Family History  Problem Relation Age of Onset   . Diabetes Mother 86       type 2  . Aneurysm Father        aortic  . Cancer Father        prostate and lung/ smoker  . Pernicious anemia Father   . Hyperlipidemia Sister   . Hypertension Sister   . Diabetes Sister        type 2  . Other Sister        degenerative disc disease  . Thyroid disease Sister   . Cancer Sister        breast  cancer s/p dbl mastectomy doing well  . Hyperlipidemia Brother   . Allergies Brother   . Diabetes Daughter        type 1  . Breast cancer Maternal Grandmother   . Heart attack Maternal Grandfather   . Heart disease Paternal Grandmother        CHF  . Pernicious anemia Paternal Grandmother   . Kidney disease Paternal Grandfather   . Stroke Paternal Grandfather   . Hypertension Sister   . Hyperlipidemia Sister   . Diabetes Sister        type 2  . Allergies Daughter   . Breast cancer Sister   . Colon cancer Neg Hx   . Esophageal cancer Neg Hx   . Stomach cancer Neg Hx   . Rectal  cancer Neg Hx     Social History   Socioeconomic History  . Marital status: Married    Spouse name: Not on file  . Number of children: Not on file  . Years of education: Not on file  . Highest education level: Not on file  Occupational History  . Not on file  Social Needs  . Financial resource strain: Not on file  . Food insecurity:    Worry: Not on file    Inability: Not on file  . Transportation needs:    Medical: Not on file    Non-medical: Not on file  Tobacco Use  . Smoking status: Never Smoker  . Smokeless tobacco: Never Used  . Tobacco comment: smoked for about 6 months  Substance and Sexual Activity  . Alcohol use: No    Comment: rarely  . Drug use: No  . Sexual activity: Yes    Birth control/protection: Post-menopausal  Lifestyle  . Physical activity:    Days per week: Not on file    Minutes per session: Not on file  . Stress: Not on file  Relationships  . Social connections:    Talks on phone: Not on file    Gets together: Not on  file    Attends religious service: Not on file    Active member of club or organization: Not on file    Attends meetings of clubs or organizations: Not on file    Relationship status: Not on file  . Intimate partner violence:    Fear of current or ex partner: Not on file    Emotionally abused: Not on file    Physically abused: Not on file    Forced sexual activity: Not on file  Other Topics Concern  . Not on file  Social History Narrative   Married.   Nonsmoker.    Outpatient Medications Prior to Visit  Medication Sig Dispense Refill  . ALPRAZolam (XANAX) 0.25 MG tablet Take 1 tablet (0.25 mg total) by mouth 2 (two) times daily as needed for anxiety. 180 tablet 1  . aspirin 81 MG tablet Take 81 mg by mouth daily.      . Calcium Carbonate-Vitamin D (CALCIUM 600+D) 600-400 MG-UNIT per tablet Take 1 tablet by mouth daily.      . Ferrous Sulfate (IRON) 325 (65 Fe) MG TABS Take by mouth 2 (two) times daily.    [name] Kitchen loratadine (CLARITIN) 10 MG tablet Take 10 mg by mouth daily as needed. Spring and fall     . meloxicam (MOBIC) 7.5 MG tablet TAKE 1 TO 2 TABLETS EVERY DAY AS NEEDED FOR MUSCULOSKELETAL PAIN 60 tablet 5  . Multiple Vitamin (MULTIVITAMIN) tablet Take 1 tablet by mouth daily.      . Omega-3 Fatty Acids (FISH OIL PO) Take by mouth daily.    . pantoprazole (PROTONIX) 40 MG tablet Take 1 tablet (40 mg total) by mouth daily. (Patient taking differently: Take 40 mg daily as needed by mouth. ) 30 tablet 6  . sertraline (ZOLOFT) 100 MG tablet TAKE 1.5 TABLETS (150 MG TOTAL) BY MOUTH DAILY. 135 tablet 3  . simvastatin (ZOCOR) 40 MG tablet TAKE 1 TABLET (40 MG TOTAL) BY MOUTH AT BEDTIME. 90 tablet 3  . cefdinir (OMNICEF) 300 MG capsule 1 tab po bid (Patient not taking: Reported on 07/01/2018) 20 capsule 0  . HYDROcodone-homatropine (HYCODAN) 5-1.5 MG/5ML syrup 1-2 tsp po bid prn cough (Patient not taking: Reported on 07/01/2018) 120 mL 0  . predniSONE (DELTASONE)  20 MG tablet 2 tabs po qd x 5d  (Patient not taking: Reported on 07/01/2018) 10 tablet 0   No facility-administered medications prior to visit.     No Known Allergies  ROS Review of Systems  Constitutional: Negative for appetite change, chills, fatigue and fever.  HENT: Negative for congestion, dental problem, ear pain and sore throat.   Eyes: Negative for discharge, redness and visual disturbance.  Respiratory: Negative for cough, chest tightness, shortness of breath and wheezing.   Cardiovascular: Negative for chest pain, palpitations and leg swelling.  Gastrointestinal: Negative for abdominal pain, blood in stool, diarrhea, nausea and vomiting.       GERD sometimes, PPI q3d scheduled helpful  Genitourinary: Negative for difficulty urinating, dysuria, flank pain, frequency, hematuria and urgency.  Musculoskeletal: Positive for arthralgias (using meloxicam intermittently). Negative for back pain, joint swelling, myalgias and neck stiffness.  Skin: Negative for pallor and rash.  Neurological: Negative for dizziness, speech difficulty, weakness and headaches.  Hematological: Negative for adenopathy. Does not bruise/bleed easily.  Psychiatric/Behavioral: Negative for confusion and sleep disturbance. The patient is not nervous/anxious.     PE; Blood pressure 95/65, pulse 75, temperature 98 F (36.7 C), temperature source Oral, resp. rate 16, height 5\' 4"  (1.626 m), weight 156 lb 6 oz (70.9 kg), SpO2 95 %. Body mass index is 26.84 kg/m. Pt examined with Helayne Seminole, CMA, as chaperone.  Gen: Alert, well appearing.  Patient is oriented to person, place, time, and situation. AFFECT: pleasant, lucid thought and speech. ENT: Ears: EACs clear, normal epithelium.  TMs with good light reflex and landmarks bilaterally.  Eyes: no injection, icteris, swelling, or exudate.  EOMI, PERRLA. Nose: no drainage or turbinate edema/swelling.  No injection or focal lesion.  Mouth: lips without lesion/swelling.  Oral mucosa pink and  moist.  Dentition intact and without obvious caries or gingival swelling.  Oropharynx without erythema, exudate, or swelling.  Neck: supple/nontender.  No LAD, mass, or TM.  Carotid pulses 2+ bilaterally, without bruits. CV: RRR, no m/r/g.   LUNGS: CTA bilat, nonlabored resps, good aeration in all lung fields. ABD: soft, NT, ND, BS normal.  No hepatospenomegaly or mass.  No bruits. EXT: no clubbing, cyanosis, or edema.  Musculoskeletal: no joint swelling, erythema, warmth, or tenderness.  ROM of all joints intact. Skin - no sores or suspicious lesions or rashes or color changes   Pertinent labs:  Lab Results  Component Value Date   TSH 3.60 06/30/2017   Lab Results  Component Value Date   WBC 5.4 06/30/2017   HGB 14.5 06/30/2017   HCT 43.4 06/30/2017   MCV 91.2 06/30/2017   PLT 177.0 06/30/2017   Lab Results  Component Value Date   CREATININE 0.76 06/30/2017   BUN 16 06/30/2017   NA 141 06/30/2017   K 4.2 06/30/2017   CL 105 06/30/2017   CO2 26 06/30/2017   Lab Results  Component Value Date   ALT 28 06/30/2017   AST 28 06/30/2017   ALKPHOS 56 06/30/2017   BILITOT 0.5 06/30/2017   Lab Results  Component Value Date   CHOL 179 06/30/2017   Lab Results  Component Value Date   HDL 51.80 06/30/2017   Lab Results  Component Value Date   LDLCALC 100 (H) 06/30/2017   Lab Results  Component Value Date   TRIG 136.0 06/30/2017   Lab Results  Component Value Date   CHOLHDL 3 06/30/2017     ASSESSMENT AND PLAN:   Health maintenance exam:  Reviewed age and gender appropriate health maintenance issues (prudent diet, regular exercise, health risks of tobacco and excessive alcohol, use of seatbelts, fire alarms in home, use of sunscreen).  Also reviewed age and gender appropriate health screening as well as vaccine recommendations. Vaccines: pneumovax 23 due--given today.    Discussed shingrix--rx faxed to pharmacy today. Labs: CBC w/diff, TSH, FLP, CMET.  Also  HbA1c--IFG at home. Cervical ca screening: to be done via GYN, as will her DEXA scan. Breast ca screening: to be done via GYN. Colon ca screening: next colonoscopy 2021-2023.  Chronic anxiety + stress related insomnia: stable on sertraline and xanax. Controlled substance contract reviewed with patient today.  Patient signed this and it will be placed in the chart.   UDS next f/u visit.  An After Visit Summary was printed and given to the patient.  FOLLOW UP:  Return in about 6 months (around 01/01/2019) for routine chronic illness f/u.  Signed:  Crissie Sickles, MD           07/01/2018

## 2018-07-01 NOTE — Patient Instructions (Signed)
For muscle cramps: take one over the counter magnesium oxide tab (500 mg) daily. Also, try drinking 2-3 oz of tonic water every morning and evening.    Health Maintenance, Female Adopting a healthy lifestyle and getting preventive care can go a long way to promote health and wellness. Talk with your health care provider about what schedule of regular examinations is right for you. This is a good chance for you to check in with your provider about disease prevention and staying healthy. In between checkups, there are plenty of things you can do on your own. Experts have done a lot of research about which lifestyle changes and preventive measures are most likely to keep you healthy. Ask your health care provider for more information. Weight and diet Eat a healthy diet  Be sure to include plenty of vegetables, fruits, low-fat dairy products, and lean protein.  Do not eat a lot of foods high in solid fats, added sugars, or salt.  Get regular exercise. This is one of the most important things you can do for your health. ? Most adults should exercise for at least 150 minutes each week. The exercise should increase your heart rate and make you sweat (moderate-intensity exercise). ? Most adults should also do strengthening exercises at least twice a week. This is in addition to the moderate-intensity exercise.  Maintain a healthy weight  Body mass index (BMI) is a measurement that can be used to identify possible weight problems. It estimates body fat based on height and weight. Your health care provider can help determine your BMI and help you achieve or maintain a healthy weight.  For females 48 years of age and older: ? A BMI below 18.5 is considered underweight. ? A BMI of 18.5 to 24.9 is normal. ? A BMI of 25 to 29.9 is considered overweight. ? A BMI of 30 and above is considered obese.  Watch levels of cholesterol and blood lipids  You should start having your blood tested for lipids and  cholesterol at 67 years of age, then have this test every 5 years.  You may need to have your cholesterol levels checked more often if: ? Your lipid or cholesterol levels are high. ? You are older than 67 years of age. ? You are at high risk for heart disease.  Cancer screening Lung Cancer  Lung cancer screening is recommended for adults 23-76 years old who are at high risk for lung cancer because of a history of smoking.  A yearly low-dose CT scan of the lungs is recommended for people who: ? Currently smoke. ? Have quit within the past 15 years. ? Have at least a 30-pack-year history of smoking. A pack year is smoking an average of one pack of cigarettes a day for 1 year.  Yearly screening should continue until it has been 15 years since you quit.  Yearly screening should stop if you develop a health problem that would prevent you from having lung cancer treatment.  Breast Cancer  Practice breast self-awareness. This means understanding how your breasts normally appear and feel.  It also means doing regular breast self-exams. Let your health care provider know about any changes, no matter how small.  If you are in your 20s or 30s, you should have a clinical breast exam (CBE) by a health care provider every 1-3 years as part of a regular health exam.  If you are 56 or older, have a CBE every year. Also consider having a breast X-ray (  mammogram) every year.  If you have a family history of breast cancer, talk to your health care provider about genetic screening.  If you are at high risk for breast cancer, talk to your health care provider about having an MRI and a mammogram every year.  Breast cancer gene (BRCA) assessment is recommended for women who have family members with BRCA-related cancers. BRCA-related cancers include: ? Breast. ? Ovarian. ? Tubal. ? Peritoneal cancers.  Results of the assessment will determine the need for genetic counseling and BRCA1 and BRCA2  testing.  Cervical Cancer Your health care provider may recommend that you be screened regularly for cancer of the pelvic organs (ovaries, uterus, and vagina). This screening involves a pelvic examination, including checking for microscopic changes to the surface of your cervix (Pap test). You may be encouraged to have this screening done every 3 years, beginning at age 63.  For women ages 32-65, health care providers may recommend pelvic exams and Pap testing every 3 years, or they may recommend the Pap and pelvic exam, combined with testing for human papilloma virus (HPV), every 5 years. Some types of HPV increase your risk of cervical cancer. Testing for HPV may also be done on women of any age with unclear Pap test results.  Other health care providers may not recommend any screening for nonpregnant women who are considered low risk for pelvic cancer and who do not have symptoms. Ask your health care provider if a screening pelvic exam is right for you.  If you have had past treatment for cervical cancer or a condition that could lead to cancer, you need Pap tests and screening for cancer for at least 20 years after your treatment. If Pap tests have been discontinued, your risk factors (such as having a new sexual partner) need to be reassessed to determine if screening should resume. Some women have medical problems that increase the chance of getting cervical cancer. In these cases, your health care provider may recommend more frequent screening and Pap tests.  Colorectal Cancer  This type of cancer can be detected and often prevented.  Routine colorectal cancer screening usually begins at 67 years of age and continues through 67 years of age.  Your health care provider may recommend screening at an earlier age if you have risk factors for colon cancer.  Your health care provider may also recommend using home test kits to check for hidden blood in the stool.  A small camera at the end of a  tube can be used to examine your colon directly (sigmoidoscopy or colonoscopy). This is done to check for the earliest forms of colorectal cancer.  Routine screening usually begins at age 80.  Direct examination of the colon should be repeated every 5-10 years through 67 years of age. However, you may need to be screened more often if early forms of precancerous polyps or small growths are found.  Skin Cancer  Check your skin from head to toe regularly.  Tell your health care provider about any new moles or changes in moles, especially if there is a change in a mole's shape or color.  Also tell your health care provider if you have a mole that is larger than the size of a pencil eraser.  Always use sunscreen. Apply sunscreen liberally and repeatedly throughout the day.  Protect yourself by wearing long sleeves, pants, a wide-brimmed hat, and sunglasses whenever you are outside.  Heart disease, diabetes, and high blood pressure  High blood pressure  causes heart disease and increases the risk of stroke. High blood pressure is more likely to develop in: ? People who have blood pressure in the high end of the normal range (130-139/85-89 mm Hg). ? People who are overweight or obese. ? People who are African American.  If you are 84-42 years of age, have your blood pressure checked every 3-5 years. If you are 50 years of age or older, have your blood pressure checked every year. You should have your blood pressure measured twice-once when you are at a hospital or clinic, and once when you are not at a hospital or clinic. Record the average of the two measurements. To check your blood pressure when you are not at a hospital or clinic, you can use: ? An automated blood pressure machine at a pharmacy. ? A home blood pressure monitor.  If you are between 7 years and 63 years old, ask your health care provider if you should take aspirin to prevent strokes.  Have regular diabetes screenings. This  involves taking a blood sample to check your fasting blood sugar level. ? If you are at a normal weight and have a low risk for diabetes, have this test once every three years after 67 years of age. ? If you are overweight and have a high risk for diabetes, consider being tested at a younger age or more often. Preventing infection Hepatitis B  If you have a higher risk for hepatitis B, you should be screened for this virus. You are considered at high risk for hepatitis B if: ? You were born in a country where hepatitis B is common. Ask your health care provider which countries are considered high risk. ? Your parents were born in a high-risk country, and you have not been immunized against hepatitis B (hepatitis B vaccine). ? You have HIV or AIDS. ? You use needles to inject street drugs. ? You live with someone who has hepatitis B. ? You have had sex with someone who has hepatitis B. ? You get hemodialysis treatment. ? You take certain medicines for conditions, including cancer, organ transplantation, and autoimmune conditions.  Hepatitis C  Blood testing is recommended for: ? Everyone born from [birth year] through 1965. ? Anyone with known risk factors for hepatitis C.  Sexually transmitted infections (STIs)  You should be screened for sexually transmitted infections (STIs) including gonorrhea and chlamydia if: ? You are sexually active and are younger than 67 years of age. ? You are older than 67 years of age and your health care provider tells you that you are at risk for this type of infection. ? Your sexual activity has changed since you were last screened and you are at an increased risk for chlamydia or gonorrhea. Ask your health care provider if you are at risk.  If you do not have HIV, but are at risk, it may be recommended that you take a prescription medicine daily to prevent HIV infection. This is called pre-exposure prophylaxis (PrEP). You are considered at risk if: ? You are  sexually active and do not regularly use condoms or know the HIV status of your partner(s). ? You take drugs by injection. ? You are sexually active with a partner who has HIV.  Talk with your health care provider about whether you are at high risk of being infected with HIV. If you choose to begin PrEP, you should first be tested for HIV. You should then be tested every 3 months for as long  as you are taking PrEP. Pregnancy  If you are premenopausal and you may become pregnant, ask your health care provider about preconception counseling.  If you may become pregnant, take 400 to 800 micrograms (mcg) of folic acid every day.  If you want to prevent pregnancy, talk to your health care provider about birth control (contraception). Osteoporosis and menopause  Osteoporosis is a disease in which the bones lose minerals and strength with aging. This can result in serious bone fractures. Your risk for osteoporosis can be identified using a bone density scan.  If you are 32 years of age or older, or if you are at risk for osteoporosis and fractures, ask your health care provider if you should be screened.  Ask your health care provider whether you should take a calcium or vitamin D supplement to lower your risk for osteoporosis.  Menopause may have certain physical symptoms and risks.  Hormone replacement therapy may reduce some of these symptoms and risks. Talk to your health care provider about whether hormone replacement therapy is right for you. Follow these instructions at home:  Schedule regular health, dental, and eye exams.  Stay current with your immunizations.  Do not use any tobacco products including cigarettes, chewing tobacco, or electronic cigarettes.  If you are pregnant, do not drink alcohol.  If you are breastfeeding, limit how much and how often you drink alcohol.  Limit alcohol intake to no more than 1 drink per day for nonpregnant women. One drink equals 12 ounces of  beer, 5 ounces of wine, or 1 ounces of hard liquor.  Do not use street drugs.  Do not share needles.  Ask your health care provider for help if you need support or information about quitting drugs.  Tell your health care provider if you often feel depressed.  Tell your health care provider if you have ever been abused or do not feel safe at home. This information is not intended to replace advice given to you by your health care provider. Make sure you discuss any questions you have with your health care provider. Document Released: 06/23/2011 Document Revised: 05/15/2016 Document Reviewed: 09/11/2015 Elsevier Interactive Patient Education  [employer].

## 2018-07-01 NOTE — Addendum Note (Signed)
Addended by: Onalee Hua on: 07/01/2018 10:12 AM   Modules accepted: Orders

## 2018-07-02 ENCOUNTER — Encounter: Payer: Self-pay | Admitting: *Deleted

## 2018-07-21 ENCOUNTER — Other Ambulatory Visit: Payer: Self-pay | Admitting: Family Medicine

## 2018-08-30 ENCOUNTER — Other Ambulatory Visit: Payer: Self-pay | Admitting: Family Medicine

## 2018-09-16 ENCOUNTER — Ambulatory Visit (INDEPENDENT_AMBULATORY_CARE_PROVIDER_SITE_OTHER): Payer: Medicare Other

## 2018-09-16 DIAGNOSIS — Z23 Encounter for immunization: Secondary | ICD-10-CM | POA: Diagnosis not present

## 2018-10-12 ENCOUNTER — Encounter: Payer: Self-pay | Admitting: Family Medicine

## 2018-10-12 ENCOUNTER — Ambulatory Visit: Payer: Medicare Other | Admitting: Family Medicine

## 2018-10-12 VITALS — BP 102/60 | HR 65 | Resp 16 | Ht 64.0 in | Wt 161.0 lb

## 2018-10-12 DIAGNOSIS — R202 Paresthesia of skin: Secondary | ICD-10-CM | POA: Diagnosis not present

## 2018-10-12 DIAGNOSIS — T881XXA Other complications following immunization, not elsewhere classified, initial encounter: Secondary | ICD-10-CM

## 2018-10-12 NOTE — Progress Notes (Signed)
OFFICE VISIT  10/12/2018   CC:  Chief Complaint  Patient presents with  . Left arm tingling   HPI:    Patient is a 67 y.o. Caucasian female who presents for left arm complaint. Complains of odd sensation at site if flu injection that was given 09/16/18--onset w/in 2d of the injection. Now the sensation--the patient has hard time describing the sensation but it is closest to the feeling of numbness/tingling---it has been roving down lateral aspect of arm and now is more consistently around the radial aspect of L wrist and on thumb.  No pain.  No focal weakness, no pain in arm.  Shoulder has no pain, neck has no pain. No malaise, no fevers, no weakness, no rash.  No neck pain. No SOB, CP, cough, or hemoptysis.  No abnl wt loss. She has never been a smoker.    Past Medical History:  Diagnosis Date  . Anxiety and depression 09/08/2011  . Female bladder prolapse   . GERD (gastroesophageal reflux disease) 09/05/2011   Improved signif with PPI  . History of adenomatous polyp of colon 08/2017   Recall 3-5 yrs  . History of Clostridium difficile infection 2007   colonic changes c/w with this dx seen on colonoscopy 2007---sx's responded to flagyl.  [name] Kitchen History of cyst of breast    multiple drained during menopause  . Hyperlipidemia   . IFG (impaired fasting glucose) 06/2018   Fastings 105-115 range when pt checked with husband's glucometer.  Fasting gluc here was 98 and A1c 5.6% 07/01/18.  . Insomnia 09/05/2011  . Migraine    worst during menopause  . Overweight (BMI 25.0-29.9) 09/05/2011  . Seasonal allergic rhinitis   . Subclinical hypothyroidism   . Vertigo, benign positional 09/08/2011    Past Surgical History:  Procedure Laterality Date  . CARDIOVASCULAR STRESS TEST  02/15/15   Exercise myocard perfusion testing: Normal EF, normal wall motion, normal perfusion.  Poor exercise capacity (4 min).  . COLONOSCOPY  07/2006; 08/25/17   2007: pseudomembranous colitis, o/w normal. (Dr.  Ardis Hughs).  08/2017: adenomatous polyp--recall 3-5 yrs  . COSMETIC SURGERY    . DEXA  06/19/09   Normal  . POSTERIOR REPAIR  2012  . TONSILLECTOMY  82  . Transvaginal ultrasound  10/2016   Normal (done for LLQ pain by Dr. Benay Pike).  . TUBAL LIGATION  1983  . vaginal hernia repair  6-12   rectocele repair    Outpatient Medications Prior to Visit  Medication Sig Dispense Refill  . ALPRAZolam (XANAX) 0.25 MG tablet Take 1 tablet (0.25 mg total) by mouth 2 (two) times daily as needed for anxiety. 180 tablet 1  . aspirin 81 MG tablet Take 81 mg by mouth daily.      . Calcium Carbonate-Vitamin D (CALCIUM 600+D) 600-400 MG-UNIT per tablet Take 1 tablet by mouth daily.      . Ferrous Sulfate (IRON) 325 (65 Fe) MG TABS Take by mouth 2 (two) times daily.    [name] Kitchen loratadine (CLARITIN) 10 MG tablet Take 10 mg by mouth daily as needed. Spring and fall     . meloxicam (MOBIC) 7.5 MG tablet TAKE 1 TO 2 TABLETS EVERY DAY AS NEEDED FOR MUSCULOSKELETAL PAIN 60 tablet 5  . Multiple Vitamin (MULTIVITAMIN) tablet Take 1 tablet by mouth daily.      . Omega-3 Fatty Acids (FISH OIL PO) Take by mouth daily.    . pantoprazole (PROTONIX) 40 MG tablet Take 1 tablet (40 mg total) by mouth daily  as needed. 30 tablet 5  . sertraline (ZOLOFT) 100 MG tablet TAKE 1.5 TABLETS (150 MG TOTAL) BY MOUTH DAILY. 135 tablet 1  . simvastatin (ZOCOR) 40 MG tablet TAKE 1 TABLET BY MOUTH EVERYDAY AT BEDTIME 90 tablet 1   No facility-administered medications prior to visit.     No Known Allergies  ROS As per HPI  PE: Blood pressure 102/60, pulse 65, resp. rate 16, height 5\' 4"  (1.626 m), weight 161 lb (73 kg), SpO2 98 %. Gen: Alert, well appearing.  Patient is oriented to person, place, time, and situation. AFFECT: pleasant, lucid thought and speech. Left arm: no erythema, rash, swelling, bruising, or tenderness.  ROM fully intact at shoulder and elbow w/out pain. Sensation intact, strength 5/5 prox/dist.  Radial pulse  2+. Pt relates that she had a distant family member who had arm symptoms and ended up getting dx'd with a lung tumor that affect the nerve to her arm.  LABS:    Chemistry      Component Value Date/Time   NA 142 07/01/2018 0938   K 4.5 07/01/2018 0938   CL 106 07/01/2018 0938   CO2 28 07/01/2018 0938   BUN 17 07/01/2018 0938   CREATININE 0.81 07/01/2018 0938      Component Value Date/Time   CALCIUM 9.9 07/01/2018 0938   ALKPHOS 51 07/01/2018 0938   AST 27 07/01/2018 0938   ALT 23 07/01/2018 0938   BILITOT 0.4 07/01/2018 0938      IMPRESSION AND PLAN:  Left arm sensory changes s/p flu injection. Suspect short term peripheral nerve damage/edema from the injection. Reviewed symptoms/signs of pancoast syndrome with pt and fortunately her arm sx's do not match. Reassured pt. Expectant mgmt. Signs/symptoms to call or return for were reviewed and pt expressed understanding.  An After Visit Summary was printed and given to the patient.  FOLLOW UP: Return if symptoms worsen or fail to improve.  Signed:  Crissie Sickles, MD           10/12/2018

## 2018-12-13 ENCOUNTER — Other Ambulatory Visit: Payer: Self-pay | Admitting: Obstetrics and Gynecology

## 2018-12-13 DIAGNOSIS — Z1231 Encounter for screening mammogram for malignant neoplasm of breast: Secondary | ICD-10-CM

## 2018-12-17 ENCOUNTER — Ambulatory Visit
Admission: RE | Admit: 2018-12-17 | Discharge: 2018-12-17 | Disposition: A | Discharge status: Home / Self Care | Payer: Medicare Other | Source: Ambulatory Visit | Attending: Obstetrics and Gynecology | Admitting: Obstetrics and Gynecology

## 2018-12-17 DIAGNOSIS — Z1231 Encounter for screening mammogram for malignant neoplasm of breast: Secondary | ICD-10-CM

## 2018-12-21 ENCOUNTER — Other Ambulatory Visit: Payer: Self-pay | Admitting: Obstetrics and Gynecology

## 2018-12-21 DIAGNOSIS — R928 Other abnormal and inconclusive findings on diagnostic imaging of breast: Secondary | ICD-10-CM

## 2019-01-03 ENCOUNTER — Ambulatory Visit
Admission: RE | Admit: 2019-01-03 | Discharge: 2019-01-03 | Disposition: A | Discharge status: Home / Self Care | Payer: Medicare Other | Source: Ambulatory Visit | Attending: Obstetrics and Gynecology | Admitting: Obstetrics and Gynecology

## 2019-01-03 DIAGNOSIS — R928 Other abnormal and inconclusive findings on diagnostic imaging of breast: Secondary | ICD-10-CM

## 2019-01-19 LAB — HM DEXA SCAN

## 2019-01-20 ENCOUNTER — Encounter: Payer: Self-pay | Admitting: Family Medicine

## 2019-01-23 ENCOUNTER — Other Ambulatory Visit: Payer: Self-pay | Admitting: Family Medicine

## 2019-01-27 ENCOUNTER — Encounter: Payer: Self-pay | Admitting: Family Medicine

## 2019-01-31 ENCOUNTER — Ambulatory Visit: Payer: Medicare Other | Admitting: Family Medicine

## 2019-02-04 ENCOUNTER — Ambulatory Visit: Payer: Medicare Other | Admitting: Family Medicine

## 2019-02-04 NOTE — Progress Notes (Deleted)
OFFICE VISIT  02/04/2019   CC: No chief complaint on file.   HPI:    Patient is a 68 y.o. Caucasian female who presents for f/u of her MDD and GAD, with stress related anxiety. She has been stable at past f/u's taking 150mg  sertraline daily and augmenting with prn use of alprazolam (mostly hs).  She has hyperlipidemia, takes simvastatin 40mg  qd.  Past Medical History:  Diagnosis Date  . Anxiety and depression 09/08/2011  . Female bladder prolapse   . GERD (gastroesophageal reflux disease) 09/05/2011   Improved signif with PPI  . History of adenomatous polyp of colon 08/2017   Recall 3-5 yrs  . History of Clostridium difficile infection 2007   colonic changes c/w with this dx seen on colonoscopy 2007---sx's responded to flagyl.  [name] Kitchen History of cyst of breast    multiple drained during menopause  . Hyperlipidemia   . IFG (impaired fasting glucose) 06/2018   Fastings 105-115 range when pt checked with husband's glucometer.  Fasting gluc here was 98 and A1c 5.6% 07/01/18.  . Insomnia 09/05/2011  . Migraine    worst during menopause  . Overweight (BMI 25.0-29.9) 09/05/2011  . Seasonal allergic rhinitis   . Subclinical hypothyroidism   . Vertigo, benign positional 09/08/2011    Past Surgical History:  Procedure Laterality Date  . CARDIOVASCULAR STRESS TEST  02/15/15   Exercise myocard perfusion testing: Normal EF, normal wall motion, normal perfusion.  Poor exercise capacity (4 min).  . COLONOSCOPY  07/2006; 08/25/17   2007: pseudomembranous colitis, o/w normal. (Dr. Ardis Hughs).  08/2017: adenomatous polyp--recall 3-5 yrs  . COSMETIC SURGERY    . DEXA  06/19/09; 01/19/2019   Normal 2010 and 2020.  [name] Kitchen POSTERIOR REPAIR  2012  . TONSILLECTOMY  82  . Transvaginal ultrasound  10/2016   Normal (done for LLQ pain by Dr. Benay Pike).  . TUBAL LIGATION  1983  . vaginal hernia repair  6-12   rectocele repair    Outpatient Medications Prior to Visit  Medication Sig Dispense Refill  .  ALPRAZolam (XANAX) 0.25 MG tablet Take 1 tablet (0.25 mg total) by mouth 2 (two) times daily as needed for anxiety. 180 tablet 1  . aspirin 81 MG tablet Take 81 mg by mouth daily.      . Calcium Carbonate-Vitamin D (CALCIUM 600+D) 600-400 MG-UNIT per tablet Take 1 tablet by mouth daily.      . Ferrous Sulfate (IRON) 325 (65 Fe) MG TABS Take by mouth 2 (two) times daily.    [name] Kitchen loratadine (CLARITIN) 10 MG tablet Take 10 mg by mouth daily as needed. Spring and fall     . meloxicam (MOBIC) 7.5 MG tablet TAKE 1 TO 2 TABLETS EVERY DAY AS NEEDED FOR MUSCULOSKELETAL PAIN 60 tablet 5  . Multiple Vitamin (MULTIVITAMIN) tablet Take 1 tablet by mouth daily.      . Omega-3 Fatty Acids (FISH OIL PO) Take by mouth daily.    . pantoprazole (PROTONIX) 40 MG tablet Take 1 tablet (40 mg total) by mouth daily as needed. 30 tablet 5  . sertraline (ZOLOFT) 100 MG tablet Take 1.5 tablets (150 mg total) by mouth daily. OFFICE VISIT NEEDED 135 tablet 0  . simvastatin (ZOCOR) 40 MG tablet Take 1 tablet (40 mg total) by mouth daily at 6 PM. OFFICE VISIT NEEDED 90 tablet 0   No facility-administered medications prior to visit.     No Known Allergies  ROS As per HPI  PE: There were no vitals taken  for this visit. ***  LABS:  Lab Results  Component Value Date   TSH 3.58 07/01/2018   Lab Results  Component Value Date   WBC 6.1 07/01/2018   HGB 14.6 07/01/2018   HCT 43.7 07/01/2018   MCV 93.9 07/01/2018   PLT 180.0 07/01/2018   Lab Results  Component Value Date   IRON 88 11/05/2017   IRON 80 11/05/2017   TIBC 302 11/05/2017   FERRITIN 60.4 11/05/2017    Lab Results  Component Value Date   CREATININE 0.81 07/01/2018   BUN 17 07/01/2018   NA 142 07/01/2018   K 4.5 07/01/2018   CL 106 07/01/2018   CO2 28 07/01/2018   Lab Results  Component Value Date   ALT 23 07/01/2018   AST 27 07/01/2018   ALKPHOS 51 07/01/2018   BILITOT 0.4 07/01/2018   Lab Results  Component Value Date   CHOL 195  07/01/2018   Lab Results  Component Value Date   HDL 52.60 07/01/2018   Lab Results  Component Value Date   LDLCALC 108 (H) 07/01/2018   Lab Results  Component Value Date   TRIG 168.0 (H) 07/01/2018   Lab Results  Component Value Date   CHOLHDL 4 07/01/2018   Lab Results  Component Value Date   HGBA1C 5.6 07/01/2018    IMPRESSION AND PLAN:  No problem-specific Assessment & Plan notes found for this encounter.  ?repeat FLP and a1c?  An After Visit Summary was printed and given to the patient.  FOLLOW UP: No follow-ups on file.  Signed:  Crissie Sickles, MD           02/04/2019

## 2019-02-10 ENCOUNTER — Ambulatory Visit: Payer: Medicare Other | Admitting: Family Medicine

## 2019-02-24 ENCOUNTER — Other Ambulatory Visit: Payer: Self-pay | Admitting: Family Medicine

## 2019-02-25 NOTE — Telephone Encounter (Signed)
#  60 (1 mo supply) eRx'd.  Needs o/v prior to any further RFs of this med.-thx

## 2019-02-25 NOTE — Telephone Encounter (Signed)
RF request for alprazolam LOV: 07/01/18 Next ov: None Last written: 09/01/17 #180 w/ 1RF  Please advise. Thanks.

## 2019-03-03 NOTE — Telephone Encounter (Signed)
Pt advised and voiced understanding.    Apt made for 03/29/19 at 11:00am.

## 2019-03-08 ENCOUNTER — Encounter: Payer: Self-pay | Admitting: Family Medicine

## 2019-03-08 ENCOUNTER — Ambulatory Visit: Payer: Medicare Other | Admitting: Family Medicine

## 2019-03-08 ENCOUNTER — Other Ambulatory Visit: Payer: Self-pay

## 2019-03-08 VITALS — BP 104/72 | HR 85 | Temp 98.6°F | Resp 16 | Ht 64.0 in | Wt 155.1 lb

## 2019-03-08 DIAGNOSIS — J019 Acute sinusitis, unspecified: Secondary | ICD-10-CM

## 2019-03-08 DIAGNOSIS — R6889 Other general symptoms and signs: Secondary | ICD-10-CM

## 2019-03-08 LAB — POC INFLUENZA A&B (BINAX/QUICKVUE)
Influenza A, POC: NEGATIVE
Influenza B, POC: NEGATIVE

## 2019-03-08 MED ORDER — AMOXICILLIN 500 MG PO CAPS: 500.0000 mg | ORAL_CAPSULE | Freq: Three times a day (TID) | ORAL | 0 refills | Status: DC

## 2019-03-08 NOTE — Progress Notes (Signed)
Barbara Munoz , 10/15/1951, 68 y.o., female MRN: [id number] Patient Care Team    Relationship Specialty Notifications Start End  McGowen, Adrian Blackwater, MD PCP - General Family Medicine  03/21/13   Delsa Bern, MD Consulting Physician Obstetrics and Gynecology  10/15/15   Milus Banister, MD Attending Physician Gastroenterology  10/15/15   Lavonna Monarch, MD Consulting Physician Dermatology  10/15/15     Chief Complaint  Patient presents with  . Cough    Wednesday night patient started feeling bad, fever, cough, body aches. Saturday per patient it turned into congestion and head cold with cough      Subjective: Pt presents for an OV with complaints of sore throat and fever 100.74F of 6 days duration.  Associated symptoms include body aches and then a cough. She Felt she was improving--> then yesterday symptoms returned and have become worse. She originally thought it was her allergies since she was out in the yard and had not started her Claritin yet. She states her grandchild tested positive for influenza A/B and strep. Another family member had influenza A.  She did have her flu shot 08/2018. She has a son in law that travels a great deal for his job- but not known [place] contacts.  Pt has tried tylenol and mucinex DM to ease their symptoms.   Depression screen [facility] 2/9 07/01/2018 11/05/2017 06/30/2017 05/28/2017  Decreased Interest 0 0 0 1  Down, Depressed, Hopeless 0 0 0 1  PHQ - 2 Score 0 0 0 2  Altered sleeping 2 2 1 3   Tired, decreased energy 2 1 1 3   Change in appetite 1 1 2 3   Feeling bad or failure about yourself  0 0 0 (No Data)  Trouble concentrating 0 1 1 2   Moving slowly or fidgety/restless 1 1 1  0  Suicidal thoughts 0 0 0 0  PHQ-9 Score 6 6 6 13   Difficult doing work/chores Not difficult at all Not difficult at all - -    No Known Allergies Social History   Social History Narrative   Married.   Nonsmoker.   Past Medical History:  Diagnosis Date  . Anxiety  and depression 09/08/2011  . Female bladder prolapse   . GERD (gastroesophageal reflux disease) 09/05/2011   Improved signif with PPI  . History of adenomatous polyp of colon 08/2017   Recall 3-5 yrs  . History of Clostridium difficile infection 2007   colonic changes c/w with this dx seen on colonoscopy 2007---sx's responded to flagyl.  [name] Kitchen History of cyst of breast    multiple drained during menopause  . Hyperlipidemia   . IFG (impaired fasting glucose) 06/2018   Fastings 105-115 range when pt checked with husband's glucometer.  Fasting gluc here was 98 and A1c 5.6% 07/01/18.  . Insomnia 09/05/2011  . Migraine    worst during menopause  . Overweight (BMI 25.0-29.9) 09/05/2011  . Seasonal allergic rhinitis   . Subclinical hypothyroidism   . Vertigo, benign positional 09/08/2011   Past Surgical History:  Procedure Laterality Date  . CARDIOVASCULAR STRESS TEST  02/15/15   Exercise myocard perfusion testing: Normal EF, normal wall motion, normal perfusion.  Poor exercise capacity (4 min).  . COLONOSCOPY  07/2006; 08/25/17   2007: pseudomembranous colitis, o/w normal. (Dr. Ardis Hughs).  08/2017: adenomatous polyp--recall 3-5 yrs  . COSMETIC SURGERY    . DEXA  06/19/09; 01/19/2019   Normal 2010 and 2020.  [name] Kitchen POSTERIOR REPAIR  2012  . TONSILLECTOMY  82  .  Transvaginal ultrasound  10/2016   Normal (done for LLQ pain by Dr. Benay Pike).  . TUBAL LIGATION  1983  . vaginal hernia repair  6-12   rectocele repair   Family History  Problem Relation Age of Onset  . Diabetes Mother 57       type 2  . Aneurysm Father        aortic  . Cancer Father        prostate and lung/ smoker  . Pernicious anemia Father   . Hyperlipidemia Sister   . Hypertension Sister   . Diabetes Sister        type 2  . Other Sister        degenerative disc disease  . Thyroid disease Sister   . Cancer Sister        breast  cancer s/p dbl mastectomy doing well  . Hyperlipidemia Brother   . Allergies Brother   .  Diabetes Daughter        type 1  . Breast cancer Maternal Grandmother   . Heart attack Maternal Grandfather   . Heart disease Paternal Grandmother        CHF  . Pernicious anemia Paternal Grandmother   . Kidney disease Paternal Grandfather   . Stroke Paternal Grandfather   . Hypertension Sister   . Hyperlipidemia Sister   . Diabetes Sister        type 2  . Allergies Daughter   . Breast cancer Sister   . Colon cancer Neg Hx   . Esophageal cancer Neg Hx   . Stomach cancer Neg Hx   . Rectal cancer Neg Hx    Allergies as of 03/08/2019   No Known Allergies     Medication List       Accurate as of March 08, 2019 11:11 AM. Always use your most recent med list.        ALPRAZolam 0.25 MG tablet Commonly known as:  XANAX TAKE 1 TABLET BY MOUTH TWICE A DAY AS NEEDED   aspirin 81 MG tablet Take 81 mg by mouth daily.   Calcium 600+D 600-400 MG-UNIT tablet Generic drug:  Calcium Carbonate-Vitamin D Take 1 tablet by mouth daily.   FISH OIL PO Take by mouth daily.   Iron 325 (65 Fe) MG Tabs Take by mouth 2 (two) times daily.   loratadine 10 MG tablet Commonly known as:  CLARITIN Take 10 mg by mouth daily as needed. Spring and fall   meloxicam 7.5 MG tablet Commonly known as:  MOBIC TAKE 1 TO 2 TABLETS EVERY DAY AS NEEDED FOR MUSCULOSKELETAL PAIN   multivitamin tablet Take 1 tablet by mouth daily.   pantoprazole 40 MG tablet Commonly known as:  PROTONIX Take 1 tablet (40 mg total) by mouth daily as needed.   sertraline 100 MG tablet Commonly known as:  ZOLOFT Take 1.5 tablets (150 mg total) by mouth daily. OFFICE VISIT NEEDED   simvastatin 40 MG tablet Commonly known as:  ZOCOR Take 1 tablet (40 mg total) by mouth daily at 6 PM. OFFICE VISIT NEEDED       All past medical history, surgical history, allergies, family history, immunizations andmedications were updated in the EMR today and reviewed under the history and medication portions of their EMR.     ROS:  Negative, with the exception of above mentioned in HPI   Objective:  BP 104/72 (BP Location: Left Arm, Patient Position: Sitting, Cuff Size: Normal)   Pulse 85   Temp  98.6 F (37 C) (Oral)   Resp 16   Ht 5\' 4"  (1.626 m)   Wt 155 lb 2 oz (70.4 kg)   SpO2 97%   BMI 26.63 kg/m  Body mass index is 26.63 kg/m. Gen: Afebrile. No acute distress. Nontoxic in appearance, well developed, well nourished.  HENT: AT. [place]. Bilateral TM visualized without erythema or fullness MMM, no oral lesions. Bilateral nares with erythema and drainage. Throat without erythema or exudates. PND present. Cough present.  Eyes:Pupils Equal Round Reactive to light, Extraocular movements intact,  Conjunctiva without redness, discharge or icterus. Neck/lymp/endocrine: Supple,no lymphadenopathy CV: RRR  Chest: CTAB, no wheeze or crackles. Good air movement, normal resp effort.  Skin: no rashes, purpura or petechiae.  Neuro: Normal gait. PERLA. EOMi. Alert. Oriented x3 No exam data present No results found. No results found for this or any previous visit (from the past 24 hour(s)).  Assessment/Plan: Barbara Munoz is a 68 y.o. female present for OV for  Acute non-recurrent sinusitis, unspecified location Rest, hydrate.  Flu test negative today.  Start flonase, mucinex (DM if cough), and nasal saline.  amox TID prescribed, take until completed.  If cough present it can last up to 6-8 weeks.  F/U 2 weeks of not improved.    Reviewed expectations re: course of current medical issues.  Discussed self-management of symptoms.  Outlined signs and symptoms indicating need for more acute intervention.  Patient verbalized understanding and all questions were answered.  Patient received an After-Visit Summary.    No orders of the defined types were placed in this encounter.    Note is dictated utilizing voice recognition software. Although note has been proof read prior to signing, occasional typographical  errors still can be missed. If any questions arise, please do not hesitate to call for verification.   electronically signed by:  Howard Pouch, DO  [place]

## 2019-03-08 NOTE — Patient Instructions (Signed)
Rest, hydrate.  Start flonase, mucinex (DM if cough), and nasal saline.  amox TID prescribed, take until completed.  If cough present it can last up to 6-8 weeks.  F/U 2 weeks of not improved.    Sinusitis, Adult Sinusitis is soreness and swelling (inflammation) of your sinuses. Sinuses are hollow spaces in the bones around your face. They are located:  Around your eyes.  In the middle of your forehead.  Behind your nose.  In your cheekbones. Your sinuses and nasal passages are lined with a fluid called mucus. Mucus drains out of your sinuses. Swelling can trap mucus in your sinuses. This lets germs (bacteria, virus, or fungus) grow, which leads to infection. Most of the time, this condition is caused by a virus. What are the causes? This condition is caused by:  Allergies.  Asthma.  Germs.  Things that block your nose or sinuses.  Growths in the nose (nasal polyps).  Chemicals or irritants in the air.  Fungus (rare). What increases the risk? You are more likely to develop this condition if:  You have a weak body defense system (immune system).  You do a lot of swimming or diving.  You use nasal sprays too much.  You smoke. What are the signs or symptoms? The main symptoms of this condition are pain and a feeling of pressure around the sinuses. Other symptoms include:  Stuffy nose (congestion).  Runny nose (drainage).  Swelling and warmth in the sinuses.  Headache.  Toothache.  A cough that may get worse at night.  Mucus that collects in the throat or the back of the nose (postnasal drip).  Being unable to smell and taste.  Being very tired (fatigue).  A fever.  Sore throat.  Bad breath. How is this diagnosed? This condition is diagnosed based on:  Your symptoms.  Your medical history.  A physical exam.  Tests to find out if your condition is short-term (acute) or long-term (chronic). Your doctor may: ? Check your nose for growths  (polyps). ? Check your sinuses using a tool that has a light (endoscope). ? Check for allergies or germs. ? Do imaging tests, such as an MRI or CT scan. How is this treated? Treatment for this condition depends on the cause and whether it is short-term or long-term.  If caused by a virus, your symptoms should go away on their own within 10 days. You may be given medicines to relieve symptoms. They include: ? Medicines that shrink swollen tissue in the nose. ? Medicines that treat allergies (antihistamines). ? A spray that treats swelling of the nostrils. ? Rinses that help get rid of thick mucus in your nose (nasal saline washes).  If caused by bacteria, your doctor may wait to see if you will get better without treatment. You may be given antibiotic medicine if you have: ? A very bad infection. ? A weak body defense system.  If caused by growths in the nose, you may need to have surgery. Follow these instructions at home: Medicines  Take, use, or apply over-the-counter and prescription medicines only as told by your doctor. These may include nasal sprays.  If you were prescribed an antibiotic medicine, take it as told by your doctor. Do not stop taking the antibiotic even if you start to feel better. Hydrate and humidify   Drink enough water to keep your pee (urine) pale yellow.  Use a cool mist humidifier to keep the humidity level in your home above 50%.  Breathe in steam for 10-15 minutes, 3-4 times a day, or as told by your doctor. You can do this in the bathroom while a hot shower is running.  Try not to spend time in cool or dry air. Rest  Rest as much as you can.  Sleep with your head raised (elevated).  Make sure you get enough sleep each night. General instructions   Put a warm, moist washcloth on your face 3-4 times a day, or as often as told by your doctor. This will help with discomfort.  Wash your hands often with soap and water. If there is no soap and  water, use hand sanitizer.  Do not smoke. Avoid being around people who are smoking (secondhand smoke).  Keep all follow-up visits as told by your doctor. This is important. Contact a doctor if:  You have a fever.  Your symptoms get worse.  Your symptoms do not get better within 10 days. Get help right away if:  You have a very bad headache.  You cannot stop throwing up (vomiting).  You have very bad pain or swelling around your face or eyes.  You have trouble seeing.  You feel confused.  Your neck is stiff.  You have trouble breathing. Summary  Sinusitis is swelling of your sinuses. Sinuses are hollow spaces in the bones around your face.  This condition is caused by tissues in your nose that become inflamed or swollen. This traps germs. These can lead to infection.  If you were prescribed an antibiotic medicine, take it as told by your doctor. Do not stop taking it even if you start to feel better.  Keep all follow-up visits as told by your doctor. This is important. This information is not intended to replace advice given to you by your health care provider. Make sure you discuss any questions you have with your health care provider. Document Released: 05/26/2008 Document Revised: 05/10/2018 Document Reviewed: 05/10/2018 Elsevier Interactive Patient Education  2019 [employer].

## 2019-03-29 ENCOUNTER — Ambulatory Visit (INDEPENDENT_AMBULATORY_CARE_PROVIDER_SITE_OTHER): Payer: Medicare Other | Admitting: Family Medicine

## 2019-03-29 ENCOUNTER — Other Ambulatory Visit: Payer: Self-pay

## 2019-03-29 ENCOUNTER — Encounter: Payer: Self-pay | Admitting: Family Medicine

## 2019-03-29 VITALS — BP 121/78 | HR 84 | Ht 64.0 in

## 2019-03-29 DIAGNOSIS — F411 Generalized anxiety disorder: Secondary | ICD-10-CM | POA: Diagnosis not present

## 2019-03-29 DIAGNOSIS — F5105 Insomnia due to other mental disorder: Secondary | ICD-10-CM

## 2019-03-29 DIAGNOSIS — F409 Phobic anxiety disorder, unspecified: Secondary | ICD-10-CM

## 2019-03-29 MED ORDER — ALPRAZOLAM 0.25 MG PO TABS: 0.2500 mg | ORAL_TABLET | Freq: Two times a day (BID) | ORAL | 1 refills | Status: DC | PRN

## 2019-03-29 NOTE — Progress Notes (Signed)
Virtual Visit via Video Note  I connected with on 03/29/19 at 11:00 AM EDT by a video enabled telemedicine application and verified that I am speaking with the correct person using two identifiers.  Location patient: home Location provider:work office Persons participating in the virtual visit: patient, her husband, and myself.  I discussed the limitations of evaluation and management by telemedicine and the availability of in person appointments. The patient expressed understanding and agreed to proceed.  Telemedicine visit is a necessity given the COVID-19 restrictions in place at the current time.  HPI: 9 month f/u anxiety/depression. Last visit she was doing well on sertraline and was periodically supplementing anxiety control (esp for anxiety-related sleep problems) with alprazolam.  CSC is UTD.   Has started taking magnesium supplement nightly--to help with sleep and ease muscle tension. As a results, she is using less xanax.  Usually takes one tab per day. Says mood is stable, no signif prolonged depressed mood.  She has been dealing with lots of allergic rhinitis with PND sore throat but managing ok.  ROS: See pertinent positives and negatives per HPI.  Past Medical History:  Diagnosis Date  . Anxiety and depression 09/08/2011  . Female bladder prolapse   . GERD (gastroesophageal reflux disease) 09/05/2011   Improved signif with PPI  . History of adenomatous polyp of colon 08/2017   Recall 3-5 yrs  . History of Clostridium difficile infection 2007   colonic changes c/w with this dx seen on colonoscopy 2007---sx's responded to flagyl.  [name] Kitchen History of cyst of breast    multiple drained during menopause  . Hyperlipidemia   . IFG (impaired fasting glucose) 06/2018   Fastings 105-115 range when pt checked with husband's glucometer.  Fasting gluc here was 98 and A1c 5.6% 07/01/18.  . Insomnia 09/05/2011  . Migraine    worst during menopause  . Overweight (BMI 25.0-29.9)  09/05/2011  . Seasonal allergic rhinitis   . Subclinical hypothyroidism   . Vertigo, benign positional 09/08/2011    Past Surgical History:  Procedure Laterality Date  . CARDIOVASCULAR STRESS TEST  02/15/15   Exercise myocard perfusion testing: Normal EF, normal wall motion, normal perfusion.  Poor exercise capacity (4 min).  . COLONOSCOPY  07/2006; 08/25/17   2007: pseudomembranous colitis, o/w normal. (Dr. Ardis Hughs).  08/2017: adenomatous polyp--recall 3-5 yrs  . COSMETIC SURGERY    . DEXA  06/19/09; 01/19/2019   Normal 2010 and 2020.  [name] Kitchen POSTERIOR REPAIR  2012  . TONSILLECTOMY  82  . Transvaginal ultrasound  10/2016   Normal (done for LLQ pain by Dr. Benay Pike).  . TUBAL LIGATION  1983  . vaginal hernia repair  6-12   rectocele repair    Family History  Problem Relation Age of Onset  . Diabetes Mother 20       type 2  . Aneurysm Father        aortic  . Cancer Father        prostate and lung/ smoker  . Pernicious anemia Father   . Hyperlipidemia Sister   . Hypertension Sister   . Diabetes Sister        type 2  . Other Sister        degenerative disc disease  . Thyroid disease Sister   . Cancer Sister        breast  cancer s/p dbl mastectomy doing well  . Hyperlipidemia Brother   . Allergies Brother   . Diabetes Daughter  type 1  . Breast cancer Maternal Grandmother   . Heart attack Maternal Grandfather   . Heart disease Paternal Grandmother        CHF  . Pernicious anemia Paternal Grandmother   . Kidney disease Paternal Grandfather   . Stroke Paternal Grandfather   . Hypertension Sister   . Hyperlipidemia Sister   . Diabetes Sister        type 2  . Allergies Daughter   . Breast cancer Sister   . Colon cancer Neg Hx   . Esophageal cancer Neg Hx   . Stomach cancer Neg Hx   . Rectal cancer Neg Hx       Current Outpatient Medications:  .  ALPRAZolam (XANAX) 0.25 MG tablet, Take 1 tablet (0.25 mg total) by mouth 2 (two) times daily as needed., Disp: 90  tablet, Rfl: 1 .  aspirin 81 MG tablet, Take 81 mg by mouth daily.  , Disp: , Rfl:  .  B Complex Vitamins (VITAMIN B-COMPLEX) TABS, Takes 1/2 tablet daily, Disp: , Rfl:  .  Calcium Carbonate-Vitamin D (CALCIUM 600+D) 600-400 MG-UNIT per tablet, Take 1 tablet by mouth daily.  , Disp: , Rfl:  .  Ferrous Sulfate (IRON) 325 (65 Fe) MG TABS, Take by mouth 2 (two) times daily. Pt takes once daily., Disp: , Rfl:  .  loratadine (CLARITIN) 10 MG tablet, Take 10 mg by mouth daily as needed. Spring and fall , Disp: , Rfl:  .  Magnesium 250 MG TABS, Take 250 mg by mouth daily., Disp: , Rfl:  .  meloxicam (MOBIC) 7.5 MG tablet, TAKE 1 TO 2 TABLETS EVERY DAY AS NEEDED FOR MUSCULOSKELETAL PAIN, Disp: 60 tablet, Rfl: 5 .  Multiple Vitamin (MULTIVITAMIN) tablet, Take 1 tablet by mouth daily.  , Disp: , Rfl:  .  Omega-3 Fatty Acids (FISH OIL PO), Take by mouth daily., Disp: , Rfl:  .  pantoprazole (PROTONIX) 40 MG tablet, Take 1 tablet (40 mg total) by mouth daily as needed., Disp: 30 tablet, Rfl: 5 .  sertraline (ZOLOFT) 100 MG tablet, Take 1.5 tablets (150 mg total) by mouth daily. OFFICE VISIT NEEDED, Disp: 135 tablet, Rfl: 0 .  simvastatin (ZOCOR) 40 MG tablet, Take 1 tablet (40 mg total) by mouth daily at 6 PM. OFFICE VISIT NEEDED, Disp: 90 tablet, Rfl: 0  EXAM:  VITALS per patient if applicable: BP 563/14 (BP Location: Right Arm, Patient Position: Sitting, Cuff Size: Normal)   Pulse 84   Ht 5\' 4"  (1.626 m)   BMI 26.63 kg/m    GENERAL: alert, oriented, appears well and in no acute distress  HEENT: atraumatic, conjunttiva clear, no obvious abnormalities on inspection of external nose and ears  NECK: normal movements of the head and neck  LUNGS: on inspection no signs of respiratory distress, breathing rate appears normal, no obvious gross SOB, gasping or wheezing  CV: no obvious cyanosis  MS: moves all visible extremities without noticeable abnormality  PSYCH/NEURO: pleasant and cooperative, no  obvious depression or anxiety, speech and thought processing grossly intact  Labs: none today  Lab Results  Component Value Date   TSH 3.58 07/01/2018   Lab Results  Component Value Date   WBC 6.1 07/01/2018   HGB 14.6 07/01/2018   HCT 43.7 07/01/2018   MCV 93.9 07/01/2018   PLT 180.0 07/01/2018   Lab Results  Component Value Date   CREATININE 0.81 07/01/2018   BUN 17 07/01/2018   NA 142 07/01/2018   K  4.5 07/01/2018   CL 106 07/01/2018   CO2 28 07/01/2018   Lab Results  Component Value Date   ALT 23 07/01/2018   AST 27 07/01/2018   ALKPHOS 51 07/01/2018   BILITOT 0.4 07/01/2018   Lab Results  Component Value Date   CHOL 195 07/01/2018   Lab Results  Component Value Date   HDL 52.60 07/01/2018   Lab Results  Component Value Date   LDLCALC 108 (H) 07/01/2018   Lab Results  Component Value Date   TRIG 168.0 (H) 07/01/2018   Lab Results  Component Value Date   CHOLHDL 4 07/01/2018   Lab Results  Component Value Date   HGBA1C 5.6 07/01/2018    ASSESSMENT AND PLAN:  Discussed the following assessment and plan:  1) GAD, hx of depression: The current medical regimen is effective;  continue present plan and medications. Continue sertraline 150mg  qd. She will continue magnesium and B complex supplement hs as well as alprazolam 1-2 times per day as needed. Alpraz 0.25mg , 1 bid prn, #90, RF x 1 eRx'd today. CSC up to date but will need to be renewed at next visit in 6 mo. UDS next f/u visit.  I discussed the assessment and treatment plan with the patient. The patient was provided an opportunity to ask questions and all were answered. The patient agreed with the plan and demonstrated an understanding of the instructions.   The patient was advised to call back or seek an in-person evaluation if the symptoms worsen or if the condition fails to improve as anticipated.  F/u: 6 mo Cpe  Signed:  Crissie Sickles, MD           03/29/2019

## 2019-04-29 ENCOUNTER — Other Ambulatory Visit: Payer: Self-pay

## 2019-04-29 MED ORDER — SIMVASTATIN 40 MG PO TABS: 40.0000 mg | ORAL_TABLET | Freq: Every day | ORAL | 1 refills | Status: DC

## 2019-05-04 ENCOUNTER — Other Ambulatory Visit: Payer: Self-pay | Admitting: Family Medicine

## 2019-05-04 MED ORDER — SERTRALINE HCL 100 MG PO TABS: 150.0000 mg | ORAL_TABLET | Freq: Every day | ORAL | 0 refills | Status: DC

## 2019-07-13 ENCOUNTER — Encounter: Payer: Self-pay | Admitting: Family Medicine

## 2019-07-14 MED ORDER — MELOXICAM 7.5 MG PO TABS: ORAL_TABLET | ORAL | 5 refills | Status: DC

## 2019-07-14 NOTE — Telephone Encounter (Signed)
Please advise if it is okay to fill meloxicam for patient?

## 2019-07-14 NOTE — Telephone Encounter (Signed)
OK, meloxicam RF eRx'd.

## 2019-07-29 ENCOUNTER — Other Ambulatory Visit: Payer: Self-pay | Admitting: Family Medicine

## 2019-10-20 ENCOUNTER — Other Ambulatory Visit: Payer: Self-pay | Admitting: Family Medicine

## 2019-10-29 ENCOUNTER — Other Ambulatory Visit: Payer: Self-pay | Admitting: Family Medicine

## 2019-10-31 ENCOUNTER — Ambulatory Visit (INDEPENDENT_AMBULATORY_CARE_PROVIDER_SITE_OTHER): Payer: Medicare Other

## 2019-10-31 ENCOUNTER — Other Ambulatory Visit: Payer: Self-pay

## 2019-10-31 DIAGNOSIS — Z23 Encounter for immunization: Secondary | ICD-10-CM

## 2019-11-07 DIAGNOSIS — N63 Unspecified lump in unspecified breast: Secondary | ICD-10-CM | POA: Insufficient documentation

## 2019-11-08 ENCOUNTER — Encounter: Payer: Self-pay | Admitting: Family Medicine

## 2019-11-08 ENCOUNTER — Ambulatory Visit: Payer: Medicare Other | Admitting: Family Medicine

## 2019-11-08 ENCOUNTER — Other Ambulatory Visit: Payer: Self-pay

## 2019-11-08 VITALS — BP 110/68 | HR 70 | Temp 98.1°F | Resp 16 | Ht 64.0 in | Wt 155.8 lb

## 2019-11-08 DIAGNOSIS — E7800 Pure hypercholesterolemia, unspecified: Secondary | ICD-10-CM

## 2019-11-08 DIAGNOSIS — M79672 Pain in left foot: Secondary | ICD-10-CM

## 2019-11-08 DIAGNOSIS — M7918 Myalgia, other site: Secondary | ICD-10-CM

## 2019-11-08 DIAGNOSIS — M79671 Pain in right foot: Secondary | ICD-10-CM | POA: Diagnosis not present

## 2019-11-08 DIAGNOSIS — M792 Neuralgia and neuritis, unspecified: Secondary | ICD-10-CM

## 2019-11-08 DIAGNOSIS — R7301 Impaired fasting glucose: Secondary | ICD-10-CM

## 2019-11-08 LAB — COMPREHENSIVE METABOLIC PANEL
ALT: 34 U/L (ref 0–35)
AST: 29 U/L (ref 0–37)
Albumin: 4.6 g/dL (ref 3.5–5.2)
Alkaline Phosphatase: 55 U/L (ref 39–117)
BUN: 16 mg/dL (ref 6–23)
CO2: 27 mEq/L (ref 19–32)
Calcium: 9.6 mg/dL (ref 8.4–10.5)
Chloride: 105 mEq/L (ref 96–112)
Creatinine, Ser: 0.7 mg/dL (ref 0.40–1.20)
GFR: 83.22 mL/min (ref >60.00)
Glucose, Bld: 96 mg/dL (ref 70–99)
Potassium: 4.6 mEq/L (ref 3.5–5.1)
Sodium: 140 mEq/L (ref 135–145)
Total Bilirubin: 0.5 mg/dL (ref 0.2–1.2)
Total Protein: 7.1 g/dL (ref 6.0–8.3)

## 2019-11-08 LAB — CBC WITH DIFFERENTIAL/PLATELET
Basophils Absolute: 0.1 10*3/uL (ref 0.0–0.1)
Basophils Relative: 0.9 % (ref 0.0–3.0)
Eosinophils Absolute: 0.1 10*3/uL (ref 0.0–0.7)
Eosinophils Relative: 2.3 % (ref 0.0–5.0)
HCT: 42.1 % (ref 36.0–46.0)
Hemoglobin: 14.3 g/dL (ref 12.0–15.0)
Lymphocytes Relative: 40.6 % (ref 12.0–46.0)
Lymphs Abs: 2.5 10*3/uL (ref 0.7–4.0)
MCHC: 34 g/dL (ref 30.0–36.0)
MCV: 94 fl (ref 78.0–100.0)
Monocytes Absolute: 0.5 10*3/uL (ref 0.1–1.0)
Monocytes Relative: 8.7 % (ref 3.0–12.0)
Neutro Abs: 2.9 10*3/uL (ref 1.4–7.7)
Neutrophils Relative %: 47.5 % (ref 43.0–77.0)
Platelets: 170 10*3/uL (ref 150.0–400.0)
RBC: 4.48 Mil/uL (ref 3.87–5.11)
RDW: 12.8 % (ref 11.5–15.5)
WBC: 6.1 10*3/uL (ref 4.0–10.5)

## 2019-11-08 LAB — HEMOGLOBIN A1C: Hgb A1c MFr Bld: 5.4 % (ref 4.6–6.5)

## 2019-11-08 LAB — VITAMIN B12: Vitamin B-12: 471 pg/mL (ref 211–911)

## 2019-11-08 LAB — TSH: TSH: 1.96 u[IU]/mL (ref 0.35–4.50)

## 2019-11-08 MED ORDER — SERTRALINE HCL 100 MG PO TABS: ORAL_TABLET | ORAL | 3 refills | Status: DC

## 2019-11-08 MED ORDER — ALPRAZOLAM 0.25 MG PO TABS: 0.2500 mg | ORAL_TABLET | Freq: Two times a day (BID) | ORAL | 1 refills | Status: DC | PRN

## 2019-11-08 NOTE — Patient Instructions (Signed)

## 2019-11-08 NOTE — Progress Notes (Signed)
OFFICE VISIT  11/08/2019   CC:  Chief Complaint  Patient presents with  . Shoulder Pain    left   HPI:    Patient is a 68 y.o. Caucasian female who presents for 6 mo f/u anxiety and depression.  A/P as of last f/u: "GAD, hx of depression: The current medical regimen is effective;  continue present plan and medications. Continue sertraline 150mg  qd. She will continue magnesium and B complex supplement hs as well as alprazolam 1-2 times per day as needed. Alpraz 0.25mg , 1 bid prn, #90, RF x 1 eRx'd today. CSC up to date but will need to be renewed at next visit in 6 mo. UDS next f/u visit."  Interim hx: PMP AWARE reviewed today: Most recent alprazolam 0.25mg  rx filled 02/25/19, #60, rx by me.  No red flags. Anxiety and mood stable.  No adverse effects from meds. Sleep ok for the most part.  Has bad snoring but no apneic events witnessed. She had self aware awakenings in remote past (10 yrs ago) but none of this sense then. No hx of OSA w/u.  Pain is big issue lately, couple months hx located L neck.  Sometimes clavicle area and sometimes muscle on L upper arm. Tingling intermittently in  L forearm but spotty, not in dermatomal distribution.  Sometimes lasting only minutes, sometimes for days, sometimes no pain at all. Also some intermittent pain in R groin, R leg, thigh, knee, shin, ankle, foot-->intermittent, no known trigger, no clear pattern at all.  Says she feels like the pains she has is muscles/soft tissues, no bones or joints. Back w/out signif prob lately. About 6 mo hx of burning and stinging pain in bottoms of both feet, minimal in morning, worsen throughout the day.  Epsom salt soaks no help. No numbness.  No rash.  No joint swelling or redness anywhere.  No f/c or night sweats. Regular use of meloxicam and/or tylenol a little helpful. No oral ulcers.  Past Medical History:  Diagnosis Date  . Anxiety and depression 09/08/2011  . Female bladder prolapse   . GERD  (gastroesophageal reflux disease) 09/05/2011   Improved signif with PPI  . History of adenomatous polyp of colon 08/2017   Recall 3-5 yrs  . History of Clostridium difficile infection 2007   colonic changes c/w with this dx seen on colonoscopy 2007---sx's responded to flagyl.  [name] Kitchen History of cyst of breast    multiple drained during menopause  . Hyperlipidemia   . IFG (impaired fasting glucose) 06/2018   Fastings 105-115 range when pt checked with husband's glucometer.  Fasting gluc here was 98 and A1c 5.6% 07/01/18.  . Insomnia 09/05/2011  . Migraine    worst during menopause  . Overweight (BMI 25.0-29.9) 09/05/2011  . Seasonal allergic rhinitis   . Subclinical hypothyroidism   . Vertigo, benign positional 09/08/2011    Past Surgical History:  Procedure Laterality Date  . CARDIOVASCULAR STRESS TEST  02/15/15   Exercise myocard perfusion testing: Normal EF, normal wall motion, normal perfusion.  Poor exercise capacity (4 min).  . COLONOSCOPY  07/2006; 08/25/17   2007: pseudomembranous colitis, o/w normal. (Dr. Ardis Hughs).  08/2017: adenomatous polyp--recall 3-5 yrs  . COSMETIC SURGERY    . DEXA  06/19/09; 01/19/2019   Normal 2010 and 2020.  [name] Kitchen POSTERIOR REPAIR  2012  . TONSILLECTOMY  82  . Transvaginal ultrasound  10/2016   Normal (done for LLQ pain by Dr. Benay Pike).  . TUBAL LIGATION  1983  . vaginal  hernia repair  6-12   rectocele repair    Outpatient Medications Prior to Visit  Medication Sig Dispense Refill  . aspirin 81 MG tablet Take 81 mg by mouth daily.      . B Complex Vitamins (VITAMIN B-COMPLEX) TABS Takes 1/2 tablet daily    . Calcium Carbonate-Vitamin D (CALCIUM 600+D) 600-400 MG-UNIT per tablet Take 1 tablet by mouth daily.      . Ferrous Sulfate (IRON) 325 (65 Fe) MG TABS Take by mouth 2 (two) times daily. Pt takes once daily.    [name] Kitchen loratadine (CLARITIN) 10 MG tablet Take 10 mg by mouth daily as needed. Spring and fall     . Magnesium 250 MG TABS Take 250 mg by mouth  daily.    . meloxicam (MOBIC) 7.5 MG tablet TAKE 1 TO 2 TABLETS EVERY DAY AS NEEDED FOR MUSCULOSKELETAL PAIN 60 tablet 5  . Multiple Vitamin (MULTIVITAMIN) tablet Take 1 tablet by mouth daily.      . Omega-3 Fatty Acids (FISH OIL PO) Take by mouth daily.    . pantoprazole (PROTONIX) 40 MG tablet Take 1 tablet (40 mg total) by mouth daily as needed. 30 tablet 5  . simvastatin (ZOCOR) 40 MG tablet TAKE 1 TABLET (40 MG TOTAL) BY MOUTH DAILY AT 6 PM. 90 tablet 1  . ALPRAZolam (XANAX) 0.25 MG tablet Take 1 tablet (0.25 mg total) by mouth 2 (two) times daily as needed. 90 tablet 1  . sertraline (ZOLOFT) 100 MG tablet TAKE 1.5 TABLETS BY MOUTH DAILY 135 tablet 0   No facility-administered medications prior to visit.     No Known Allergies  ROS As per HPI  PE: Blood pressure 110/68, pulse 70, temperature 98.1 F (36.7 C), temperature source Temporal, resp. rate 16, height 5\' 4"  (1.626 m), weight 155 lb 12.8 oz (70.7 kg), SpO2 98 %. Body mass index is 26.74 kg/m.  Gen: Alert, well appearing.  Patient is oriented to person, place, time, and situation. AFFECT: pleasant, lucid thought and speech. [id number]: no injection, icteris, swelling, or exudate.  EOMI, PERRLA. Mouth: lips without lesion/swelling.  Oral mucosa pink and moist. Oropharynx without erythema, exudate, or swelling.  Neck - No masses or thyromegaly or limitation in range of motion CV: RRR, no m/r/g.   LUNGS: CTA bilat, nonlabored resps, good aeration in all lung fields. EXT: no clubbing or cyanosis.  no edema.  Musculoskeletal: no joint swelling, erythema, warmth, or tenderness.  ROM of all joints intact. Foot exam - no deformity. Normal arches.  no swelling, tenderness or skin or vascular lesions. Color and temperature is normal. Sensation is intact. Peripheral pulses are palpable. Toenails are normal.   LABS:  Lab Results  Component Value Date   TSH 3.58 07/01/2018   Lab Results  Component Value Date   WBC 6.1 07/01/2018    HGB 14.6 07/01/2018   HCT 43.7 07/01/2018   MCV 93.9 07/01/2018   PLT 180.0 07/01/2018   Lab Results  Component Value Date   IRON 88 11/05/2017   IRON 80 11/05/2017   TIBC 302 11/05/2017   FERRITIN 60.4 11/05/2017   Lab Results  Component Value Date   VITAMINB12 355 04/07/2017    Lab Results  Component Value Date   CREATININE 0.81 07/01/2018   BUN 17 07/01/2018   NA 142 07/01/2018   K 4.5 07/01/2018   CL 106 07/01/2018   CO2 28 07/01/2018   Lab Results  Component Value Date   ALT 23 07/01/2018  AST 27 07/01/2018   ALKPHOS 51 07/01/2018   BILITOT 0.4 07/01/2018   Lab Results  Component Value Date   CHOL 195 07/01/2018   Lab Results  Component Value Date   HDL 52.60 07/01/2018   Lab Results  Component Value Date   LDLCALC 108 (H) 07/01/2018   Lab Results  Component Value Date   TRIG 168.0 (H) 07/01/2018   Lab Results  Component Value Date   CHOLHDL 4 07/01/2018   Lab Results  Component Value Date   HGBA1C 5.6 07/01/2018    IMPRESSION AND PLAN:  1) Anxiety and depression; doing well. No changes in meds. PMP AWARE reviewed; no red flags. Alpraz 0.25mg , 1 bid prn, #90, rf x 1 eRx'd today. Continue sertraline 150mg  qd.  2) Hyperchol: FLP to be done at her CPE 12/01/19. Hepatic panel today.  3) Myofascial pain syndrome: suspect fibromyalgia syndrome. She has manifested some sx's of this off and on for years. Will hold off on med for this currently but we'll discuss more about med option at her CPE in about 1 mo.  4) Bilat feet pain: she finds it hard to describe but seems most consistent with neuropathic pain. Check B12, B6, TSH, CBC, A1c, glucose, lytes/cr. Consider trial of gabapentin vs neuro referral for PN w/u->address again at CPE in 1 mo.  An After Visit Summary was printed and given to the patient.  FOLLOW UP: Return for keep appt already set for CPE 12/01/19.  Signed:  Crissie Sickles, MD           11/08/2019

## 2019-11-14 LAB — VITAMIN B6: Vitamin B6: 25.8 ng/mL — ABNORMAL HIGH (ref 2.1–21.7)

## 2019-11-15 ENCOUNTER — Telehealth: Payer: Self-pay | Admitting: Family Medicine

## 2019-11-15 ENCOUNTER — Other Ambulatory Visit: Payer: Self-pay | Admitting: Family Medicine

## 2019-11-15 DIAGNOSIS — M25561 Pain in right knee: Secondary | ICD-10-CM

## 2019-11-15 NOTE — Telephone Encounter (Signed)
Spoke with patient and she stated she is having "fibromyalgia", it was discussed during her visit last week on 11/17. Her right knee has been hurting off and on. She is using a cane to get around and had not gone out since last week. She is managing with Ibu 400mg  and Meloxicam 7.5mg . Wilburn Mylar she took 3 Ibu, 3 Meloxicam and this morning 3 Ibu and 2 Meloxicam.   Please advise, thanks.

## 2019-11-15 NOTE — Telephone Encounter (Signed)
Patient is in a lot of pain. No appointments available. Patient requests call back.

## 2019-11-15 NOTE — Telephone Encounter (Signed)
Are other parts of her body hurting currently, or just her knee? Last visit we felt like all her pain was soft tissue type of pain, not really joint or bone--so her focal right knee pain is not typical for her (confirm this with her). Suspect she may actually have some "wear and tear" type arthritis causing this knee pain. I'll order xray of knee. It sounds like she is taking too much anti-inflammatory medication-->might harm her stomach or kidneys/liver taking it this way.  I recommend max of 15 mg meloxicam per day OR max of 600 mg ibuprofen three times a day with food. Ask if she wants to try hydrocodone for severe pain--short term. Let me known-thx

## 2019-11-15 NOTE — Telephone Encounter (Signed)
OK. Noted 

## 2019-11-15 NOTE — Telephone Encounter (Signed)
Her right ankle, hips, and primarily knee pain. Yesterday it felt like a "catch" in her joints, things are a little better today. She was okay with knee XR but would prefer not to try pain med at this time.

## 2019-11-16 ENCOUNTER — Other Ambulatory Visit: Payer: Self-pay

## 2019-11-16 ENCOUNTER — Ambulatory Visit (HOSPITAL_BASED_OUTPATIENT_CLINIC_OR_DEPARTMENT_OTHER)
Admission: RE | Admit: 2019-11-16 | Discharge: 2019-11-16 | Disposition: A | Discharge status: Home / Self Care | Payer: Medicare Other | Source: Ambulatory Visit | Attending: Family Medicine | Admitting: Family Medicine

## 2019-11-16 ENCOUNTER — Encounter: Payer: Self-pay | Admitting: Family Medicine

## 2019-11-16 DIAGNOSIS — M25561 Pain in right knee: Secondary | ICD-10-CM | POA: Insufficient documentation

## 2019-12-01 ENCOUNTER — Encounter: Payer: Medicare Other | Admitting: Family Medicine

## 2019-12-08 ENCOUNTER — Encounter: Payer: Medicare Other | Admitting: Family Medicine

## 2019-12-18 LAB — HM MAMMOGRAPHY

## 2019-12-21 ENCOUNTER — Other Ambulatory Visit: Payer: Self-pay

## 2019-12-22 ENCOUNTER — Encounter: Payer: Self-pay | Admitting: Family Medicine

## 2019-12-22 ENCOUNTER — Ambulatory Visit (INDEPENDENT_AMBULATORY_CARE_PROVIDER_SITE_OTHER): Payer: Medicare Other | Admitting: Family Medicine

## 2019-12-22 VITALS — BP 130/79 | HR 72 | Temp 98.0°F | Resp 16 | Ht 64.0 in | Wt 160.0 lb

## 2019-12-22 DIAGNOSIS — Z0000 Encounter for general adult medical examination without abnormal findings: Secondary | ICD-10-CM | POA: Diagnosis not present

## 2019-12-22 DIAGNOSIS — E7800 Pure hypercholesterolemia, unspecified: Secondary | ICD-10-CM | POA: Diagnosis not present

## 2019-12-22 DIAGNOSIS — M25561 Pain in right knee: Secondary | ICD-10-CM | POA: Diagnosis not present

## 2019-12-22 DIAGNOSIS — Z23 Encounter for immunization: Secondary | ICD-10-CM | POA: Diagnosis not present

## 2019-12-22 LAB — LIPID PANEL
Cholesterol: 168 mg/dL (ref 0–200)
HDL: 49.3 mg/dL (ref >39.00)
LDL Cholesterol: 88 mg/dL (ref 0–99)
NonHDL: 118.6
Total CHOL/HDL Ratio: 3
Triglycerides: 153 mg/dL — ABNORMAL HIGH (ref 0.0–149.0)
VLDL: 30.6 mg/dL (ref 0.0–40.0)

## 2019-12-22 LAB — URIC ACID: Uric Acid, Serum: 3.2 mg/dL (ref 2.4–7.0)

## 2019-12-22 MED ORDER — SHINGRIX 50 MCG/0.5ML IM SUSR: 0.5000 mL | Freq: Once | INTRAMUSCULAR | 1 refills | Status: AC

## 2019-12-22 NOTE — Patient Instructions (Signed)
Health Maintenance, Female Adopting a healthy lifestyle and getting preventive care are important in promoting health and wellness. Ask your health care provider about:  The right schedule for you to have regular tests and exams.  Things you can do on your own to prevent diseases and keep yourself healthy. What should I know about diet, weight, and exercise? Eat a healthy diet   Eat a diet that includes plenty of vegetables, fruits, low-fat dairy products, and lean protein.  Do not eat a lot of foods that are high in solid fats, added sugars, or sodium. Maintain a healthy weight Body mass index (BMI) is used to identify weight problems. It estimates body fat based on height and weight. Your health care provider can help determine your BMI and help you achieve or maintain a healthy weight. Get regular exercise Get regular exercise. This is one of the most important things you can do for your health. Most adults should:  Exercise for at least 150 minutes each week. The exercise should increase your heart rate and make you sweat (moderate-intensity exercise).  Do strengthening exercises at least twice a week. This is in addition to the moderate-intensity exercise.  Spend less time sitting. Even light physical activity can be beneficial. Watch cholesterol and blood lipids Have your blood tested for lipids and cholesterol at 68 years of age, then have this test every 5 years. Have your cholesterol levels checked more often if:  Your lipid or cholesterol levels are high.  You are older than 68 years of age.  You are at high risk for heart disease. What should I know about cancer screening? Depending on your health history and family history, you may need to have cancer screening at various ages. This may include screening for:  Breast cancer.  Cervical cancer.  Colorectal cancer.  Skin cancer.  Lung cancer. What should I know about heart disease, diabetes, and high blood  pressure? Blood pressure and heart disease  High blood pressure causes heart disease and increases the risk of stroke. This is more likely to develop in people who have high blood pressure readings, are of African descent, or are overweight.  Have your blood pressure checked: ? Every 3-5 years if you are 18-39 years of age. ? Every year if you are 40 years old or older. Diabetes Have regular diabetes screenings. This checks your fasting blood sugar level. Have the screening done:  Once every three years after age 40 if you are at a normal weight and have a low risk for diabetes.  More often and at a younger age if you are overweight or have a high risk for diabetes. What should I know about preventing infection? Hepatitis B If you have a higher risk for hepatitis B, you should be screened for this virus. Talk with your health care provider to find out if you are at risk for hepatitis B infection. Hepatitis C Testing is recommended for:  Everyone born from 1945 through 1965.  Anyone with known risk factors for hepatitis C. Sexually transmitted infections (STIs)  Get screened for STIs, including gonorrhea and chlamydia, if: ? You are sexually active and are younger than 68 years of age. ? You are older than 68 years of age and your health care provider tells you that you are at risk for this type of infection. ? Your sexual activity has changed since you were last screened, and you are at increased risk for chlamydia or gonorrhea. Ask your health care provider if   you are at risk.  Ask your health care provider about whether you are at high risk for HIV. Your health care provider may recommend a prescription medicine to help prevent HIV infection. If you choose to take medicine to prevent HIV, you should first get tested for HIV. You should then be tested every 3 months for as long as you are taking the medicine. Pregnancy  If you are about to stop having your period (premenopausal) and  you may become pregnant, seek counseling before you get pregnant.  Take 400 to 800 micrograms (mcg) of folic acid every day if you become pregnant.  Ask for birth control (contraception) if you want to prevent pregnancy. Osteoporosis and menopause Osteoporosis is a disease in which the bones lose minerals and strength with aging. This can result in bone fractures. If you are 65 years old or older, or if you are at risk for osteoporosis and fractures, ask your health care provider if you should:  Be screened for bone loss.  Take a calcium or vitamin D supplement to lower your risk of fractures.  Be given hormone replacement therapy (HRT) to treat symptoms of menopause. Follow these instructions at home: Lifestyle  Do not use any products that contain nicotine or tobacco, such as cigarettes, e-cigarettes, and chewing tobacco. If you need help quitting, ask your health care provider.  Do not use street drugs.  Do not share needles.  Ask your health care provider for help if you need support or information about quitting drugs. Alcohol use  Do not drink alcohol if: ? Your health care provider tells you not to drink. ? You are pregnant, may be pregnant, or are planning to become pregnant.  If you drink alcohol: ? Limit how much you use to 0-1 drink a day. ? Limit intake if you are breastfeeding.  Be aware of how much alcohol is in your drink. In the [place] one drink equals one 12 oz bottle of beer (355 mL), one 5 oz glass of wine (148 mL), or one 1 oz glass of hard liquor (44 mL). General instructions  Schedule regular health, dental, and eye exams.  Stay current with your vaccines.  Tell your health care provider if: ? You often feel depressed. ? You have ever been abused or do not feel safe at home. Summary  Adopting a healthy lifestyle and getting preventive care are important in promoting health and wellness.  Follow your health care provider's instructions about healthy  diet, exercising, and getting tested or screened for diseases.  Follow your health care provider's instructions on monitoring your cholesterol and blood pressure. This information is not intended to replace advice given to you by your health care provider. Make sure you discuss any questions you have with your health care provider. Document Revised: 12/01/2018 Document Reviewed: 12/01/2018 [employer]vier Patient Education  2020 [employer]  

## 2019-12-22 NOTE — Progress Notes (Signed)
Office Note 12/22/2019  CC:  Chief Complaint  Patient presents with  . Annual Exam    HPI:  Barbara Munoz is a 68 y.o. White female who is here for annual health maintenance exam. GYN MD: Dr. Cletis Media.  PMP AWARE reviewed today: most recent rx for alpraz 0.25mg  was filled 11/08/19, # 37, rx by me. No red flags.  I last saw her for f/u anx/dep 6 wks ago, no changes made. On 11/15/19 she called with bad right knee pain--sounded different than what we thought was "fibromyalgia" at her last visit. R knee xray 11/16/19 NORMAL.  The pain in knee lasted a few weeks and spontaneously resolved.   She felt like it was swollen but it didn't really appear swollen.  No redness or heat. Has had plantar feet pain +pain underneath toes in the past.  L shoulder pain recurred around x-mas. Still a question of whether the etiology of all her body pains are soft tissue in nature or from joint problem. She takes meloxicam 7.5mg  bid.  Diet: usually good but not very good for the holiday season.  She does stress eat.  Her husband's mother died christmas eve. Exercise: minimal at this time.  Pt considering hemp salve.  Past Medical History:  Diagnosis Date  . Anxiety and depression 09/08/2011  . Female bladder prolapse   . GERD (gastroesophageal reflux disease) 09/05/2011   Improved signif with PPI  . History of adenomatous polyp of colon 08/2017   Recall 3-5 yrs  . History of Clostridium difficile infection 2007   colonic changes c/w with this dx seen on colonoscopy 2007---sx's responded to flagyl.  [name] Kitchen History of cyst of breast    multiple drained during menopause  . Hyperlipidemia   . IFG (impaired fasting glucose) 06/2018   Fastings 105-115 range when pt checked with husband's glucometer.  Fasting gluc here was 98 and A1c 5.6% 07/01/18.  . Insomnia 09/05/2011  . Migraine    worst during menopause  . Overweight (BMI 25.0-29.9) 09/05/2011  . Seasonal allergic rhinitis   . Subclinical  hypothyroidism   . Vertigo, benign positional 09/08/2011    Past Surgical History:  Procedure Laterality Date  . CARDIOVASCULAR STRESS TEST  02/15/15   Exercise myocard perfusion testing: Normal EF, normal wall motion, normal perfusion.  Poor exercise capacity (4 min).  . COLONOSCOPY  07/2006; 08/25/17   2007: pseudomembranous colitis, o/w normal. (Dr. Ardis Hughs).  08/2017: adenomatous polyp--recall 3-5 yrs  . COSMETIC SURGERY    . DEXA  06/19/09; 01/19/2019   Normal 2010 and 2020.  [name] Kitchen POSTERIOR REPAIR  2012  . TONSILLECTOMY  82  . Transvaginal ultrasound  10/2016   Normal (done for LLQ pain by Dr. Benay Pike).  . TUBAL LIGATION  1983  . vaginal hernia repair  6-12   rectocele repair    Family History  Problem Relation Age of Onset  . Diabetes Mother 32       type 2  . Aneurysm Father        aortic  . Cancer Father        prostate and lung/ smoker  . Pernicious anemia Father   . Hyperlipidemia Sister   . Hypertension Sister   . Diabetes Sister        type 2  . Other Sister        degenerative disc disease  . Thyroid disease Sister   . Cancer Sister        breast  cancer s/p dbl mastectomy  doing well  . Hyperlipidemia Brother   . Allergies Brother   . Diabetes Daughter        type 1  . Breast cancer Maternal Grandmother   . Heart attack Maternal Grandfather   . Heart disease Paternal Grandmother        CHF  . Pernicious anemia Paternal Grandmother   . Kidney disease Paternal Grandfather   . Stroke Paternal Grandfather   . Hypertension Sister   . Hyperlipidemia Sister   . Diabetes Sister        type 2  . Allergies Daughter   . Breast cancer Sister   . Colon cancer Neg Hx   . Esophageal cancer Neg Hx   . Stomach cancer Neg Hx   . Rectal cancer Neg Hx     Social History   Socioeconomic History  . Marital status: Married    Spouse name: Not on file  . Number of children: Not on file  . Years of education: Not on file  . Highest education level: Not on file   Occupational History  . Not on file  Tobacco Use  . Smoking status: Never Smoker  . Smokeless tobacco: Never Used  . Tobacco comment: smoked for about 6 months  Substance and Sexual Activity  . Alcohol use: No    Comment: rarely  . Drug use: No  . Sexual activity: Yes    Birth control/protection: Post-menopausal  Other Topics Concern  . Not on file  Social History Narrative   Married.   Nonsmoker.   Social Determinants of Health   Financial Resource Strain:   . Difficulty of Paying Living Expenses: Not on file  Food Insecurity:   . Worried About Charity fundraiser in the Last Year: Not on file  . Ran Out of Food in the Last Year: Not on file  Transportation Needs:   . Lack of Transportation (Medical): Not on file  . Lack of Transportation (Non-Medical): Not on file  Physical Activity:   . Days of Exercise per Week: Not on file  . Minutes of Exercise per Session: Not on file  Stress:   . Feeling of Stress : Not on file  Social Connections:   . Frequency of Communication with Friends and Family: Not on file  . Frequency of Social Gatherings with Friends and Family: Not on file  . Attends Religious Services: Not on file  . Active Member of Clubs or Organizations: Not on file  . Attends Archivist Meetings: Not on file  . Marital Status: Not on file  Intimate Partner Violence:   . Fear of Current or Ex-Partner: Not on file  . Emotionally Abused: Not on file  . Physically Abused: Not on file  . Sexually Abused: Not on file    Outpatient Medications Prior to Visit  Medication Sig Dispense Refill  . ALPRAZolam (XANAX) 0.25 MG tablet Take 1 tablet (0.25 mg total) by mouth 2 (two) times daily as needed. 90 tablet 1  . aspirin 81 MG tablet Take 81 mg by mouth daily.      . B Complex Vitamins (VITAMIN B-COMPLEX) TABS Takes 1/2 tablet daily    . Calcium Carbonate-Vitamin D (CALCIUM 600+D) 600-400 MG-UNIT per tablet Take 1 tablet by mouth daily.      . Ferrous  Sulfate (IRON) 325 (65 Fe) MG TABS Take by mouth 2 (two) times daily. Pt takes once daily.    [name] Kitchen loratadine (CLARITIN) 10 MG tablet Take 10 mg by  mouth daily as needed. Spring and fall     . Magnesium 250 MG TABS Take 250 mg by mouth daily.    . meloxicam (MOBIC) 7.5 MG tablet TAKE 1 TO 2 TABLETS EVERY DAY AS NEEDED FOR MUSCULOSKELETAL PAIN 60 tablet 5  . Multiple Vitamin (MULTIVITAMIN) tablet Take 1 tablet by mouth daily.      . Omega-3 Fatty Acids (FISH OIL PO) Take by mouth daily.    . pantoprazole (PROTONIX) 40 MG tablet Take 1 tablet (40 mg total) by mouth daily as needed. 30 tablet 5  . sertraline (ZOLOFT) 100 MG tablet TAKE 1.5 TABLETS BY MOUTH DAILY 135 tablet 3  . simvastatin (ZOCOR) 40 MG tablet TAKE 1 TABLET (40 MG TOTAL) BY MOUTH DAILY AT 6 PM. 90 tablet 1   No facility-administered medications prior to visit.    No Known Allergies  ROS Review of Systems  Constitutional: Negative for appetite change, chills, fatigue and fever.  HENT: Negative for congestion, dental problem, ear pain and sore throat.   Eyes: Negative for discharge, redness and visual disturbance.  Respiratory: Negative for cough, chest tightness, shortness of breath and wheezing.   Cardiovascular: Negative for chest pain, palpitations and leg swelling.  Gastrointestinal: Negative for abdominal pain, blood in stool, diarrhea, nausea and vomiting.  Genitourinary: Negative for difficulty urinating, dysuria, flank pain, frequency, hematuria and urgency.  Musculoskeletal: Positive for arthralgias (as noted in hPi above). Negative for back pain, joint swelling, myalgias and neck stiffness.  Skin: Negative for pallor and rash.  Neurological: Negative for dizziness, speech difficulty, weakness and headaches.  Hematological: Negative for adenopathy. Does not bruise/bleed easily.  Psychiatric/Behavioral: Negative for confusion and sleep disturbance. The patient is not nervous/anxious.     PE; Blood pressure 130/79,  pulse 72, temperature 98 F (36.7 C), temperature source Temporal, resp. rate 16, height 5\' 4"  (1.626 m), weight 160 lb (72.6 kg), SpO2 98 %. Body mass index is 27.46 kg/m. Exam chaperoned by Wynonia Musty, CMA. Gen: Alert, well appearing.  Patient is oriented to person, place, time, and situation. AFFECT: pleasant, lucid thought and speech. ENT: Ears: EACs clear, normal epithelium.  TMs with good light reflex and landmarks bilaterally.  Eyes: no injection, icteris, swelling, or exudate.  EOMI, PERRLA. Nose: no drainage or turbinate edema/swelling.  No injection or focal lesion.  Mouth: lips without lesion/swelling.  Oral mucosa pink and moist.  Dentition intact and without obvious caries or gingival swelling.  Oropharynx without erythema, exudate, or swelling.  Neck: supple/nontender.  No LAD, mass, or TM.  Carotid pulses 2+ bilaterally, without bruits. CV: RRR, no m/r/g.   LUNGS: CTA bilat, nonlabored resps, good aeration in all lung fields. ABD: soft, NT, ND, BS normal.  No hepatospenomegaly or mass.  No bruits. EXT: no clubbing, cyanosis, or edema.  Musculoskeletal: no joint swelling, erythema, warmth, or tenderness.  ROM of all joints intact. Skin - no sores or suspicious lesions or rashes or color changes   Pertinent labs:   No results found for: [facility]  Lab Results  Component Value Date   TSH 1.96 11/08/2019   Lab Results  Component Value Date   WBC 6.1 11/08/2019   HGB 14.3 11/08/2019   HCT 42.1 11/08/2019   MCV 94.0 11/08/2019   PLT 170.0 11/08/2019   Lab Results  Component Value Date   IRON 88 11/05/2017   IRON 80 11/05/2017   TIBC 302 11/05/2017   FERRITIN 60.4 11/05/2017   Lab Results  Component Value Date  [id number] 11/08/2019    Lab Results  Component Value Date   CREATININE 0.70 11/08/2019   BUN 16 11/08/2019   NA 140 11/08/2019   K 4.6 11/08/2019   CL 105 11/08/2019   CO2 27 11/08/2019   Lab Results  Component Value Date   ALT 34  11/08/2019   AST 29 11/08/2019   ALKPHOS 55 11/08/2019   BILITOT 0.5 11/08/2019   Lab Results  Component Value Date   CHOL 195 07/01/2018   Lab Results  Component Value Date   HDL 52.60 07/01/2018   Lab Results  Component Value Date   LDLCALC 108 (H) 07/01/2018   Lab Results  Component Value Date   TRIG 168.0 (H) 07/01/2018   Lab Results  Component Value Date   CHOLHDL 4 07/01/2018   Lab Results  Component Value Date   HGBA1C 5.4 11/08/2019   Lab Results  Component Value Date   ESRSEDRATE 25 03/12/2017    ASSESSMENT AND PLAN:   1) Roving musculoskeletal pain: not clear whether soft tissue etiology completely (or some arthritic/synovitis component, esp in recent R knee episode. Uric acid level baseline today.  Obs-->return if severe joint pain like before.  2) Health maintenance exam: Reviewed age and gender appropriate health maintenance issues (prudent diet, regular exercise, health risks of tobacco and excessive alcohol, use of seatbelts, fire alarms in home, use of sunscreen).  Also reviewed age and gender appropriate health screening as well as vaccine recommendations. Vaccines:  Shingrix discussed->will send rx for this to her pharmacy.   All other approp vaccines UTD. Labs: FLP today.  CMET, CBC, TSH all normal 6 wks ago. Cervical ca screening: 07/2012 is the date of the last PAP in our EMR-->NEG.  She gets GYN exams by Dr. Cletis Media. Breast ca screening: most recent mammo was 12/2018.  This screening is arranged and followed up by her GYN MD. Colon ca screening: Recall 2021-2023 range-->Dr. Ardis Hughs.  An After Visit Summary was printed and given to the patient.  FOLLOW UP:  Return in about 6 months (around 06/20/2020) for routine chronic illness f/u.  Signed:  Crissie Sickles, MD           12/22/2019

## 2020-01-19 ENCOUNTER — Other Ambulatory Visit: Payer: Self-pay | Admitting: Obstetrics and Gynecology

## 2020-01-19 DIAGNOSIS — Z1231 Encounter for screening mammogram for malignant neoplasm of breast: Secondary | ICD-10-CM

## 2020-02-28 ENCOUNTER — Ambulatory Visit
Admission: RE | Admit: 2020-02-28 | Discharge: 2020-02-28 | Disposition: A | Discharge status: Home / Self Care | Payer: Medicare Other | Source: Ambulatory Visit | Attending: Obstetrics and Gynecology | Admitting: Obstetrics and Gynecology

## 2020-02-28 ENCOUNTER — Other Ambulatory Visit: Payer: Self-pay

## 2020-02-28 DIAGNOSIS — Z1231 Encounter for screening mammogram for malignant neoplasm of breast: Secondary | ICD-10-CM

## 2020-04-26 ENCOUNTER — Other Ambulatory Visit: Payer: Self-pay | Admitting: Family Medicine

## 2020-05-19 DIAGNOSIS — F349 Persistent mood [affective] disorder, unspecified: Secondary | ICD-10-CM | POA: Diagnosis not present

## 2020-05-19 DIAGNOSIS — Z8249 Family history of ischemic heart disease and other diseases of the circulatory system: Secondary | ICD-10-CM | POA: Diagnosis not present

## 2020-05-19 DIAGNOSIS — G8929 Other chronic pain: Secondary | ICD-10-CM | POA: Diagnosis not present

## 2020-05-19 DIAGNOSIS — F419 Anxiety disorder, unspecified: Secondary | ICD-10-CM | POA: Diagnosis not present

## 2020-05-19 DIAGNOSIS — I739 Peripheral vascular disease, unspecified: Secondary | ICD-10-CM | POA: Diagnosis not present

## 2020-05-19 DIAGNOSIS — J309 Allergic rhinitis, unspecified: Secondary | ICD-10-CM | POA: Diagnosis not present

## 2020-05-19 DIAGNOSIS — Z809 Family history of malignant neoplasm, unspecified: Secondary | ICD-10-CM | POA: Diagnosis not present

## 2020-05-19 DIAGNOSIS — I499 Cardiac arrhythmia, unspecified: Secondary | ICD-10-CM | POA: Diagnosis not present

## 2020-05-19 DIAGNOSIS — E785 Hyperlipidemia, unspecified: Secondary | ICD-10-CM | POA: Diagnosis not present

## 2020-05-19 DIAGNOSIS — Z803 Family history of malignant neoplasm of breast: Secondary | ICD-10-CM | POA: Diagnosis not present

## 2020-05-19 DIAGNOSIS — K219 Gastro-esophageal reflux disease without esophagitis: Secondary | ICD-10-CM | POA: Diagnosis not present

## 2020-05-19 DIAGNOSIS — K5900 Constipation, unspecified: Secondary | ICD-10-CM | POA: Diagnosis not present

## 2020-05-23 ENCOUNTER — Encounter: Payer: Self-pay | Admitting: Family Medicine

## 2020-05-23 ENCOUNTER — Ambulatory Visit: Payer: Medicare PPO | Admitting: Family Medicine

## 2020-05-23 ENCOUNTER — Other Ambulatory Visit: Payer: Self-pay

## 2020-05-23 VITALS — BP 118/79 | HR 73 | Temp 98.0°F | Resp 16 | Ht 64.0 in | Wt 157.0 lb

## 2020-05-23 DIAGNOSIS — M222X1 Patellofemoral disorders, right knee: Secondary | ICD-10-CM

## 2020-05-23 DIAGNOSIS — F419 Anxiety disorder, unspecified: Secondary | ICD-10-CM

## 2020-05-23 DIAGNOSIS — F32A Depression, unspecified: Secondary | ICD-10-CM

## 2020-05-23 DIAGNOSIS — F329 Major depressive disorder, single episode, unspecified: Secondary | ICD-10-CM

## 2020-05-23 NOTE — Progress Notes (Signed)
OFFICE VISIT  05/23/2020   CC:  Chief Complaint  Patient presents with  . Knee Pain    right,    HPI:    Patient is a 69 y.o. Caucasian female who presents for ongoing right knee pain and f/u anx/dep with periodic benzo use. About 1 yr hx of intermittent and waxing/waning body pains, sometimes feels like soft tissues, sometimes feels like bones/joints. Conservative approach has been taken. She has never seen an orthopedist. 10/2019 R knee plain films NORMAL.  Had been doing fine, working in yard, active. Then push mowed yard 1 wk ago, about an hour later it was painful (no injury sustained), "catches", points to inferolateral aspect of knee, occ on "medial aspect", most of the time the pain is mild.  Feels like it will give way when walking.  No pain unless wt bearing.  No swelling.  No redness.  Taking 7.77m melox qd and rubbed some CBD topical on it.  Anx/dep: mood and anxiety level stable, feeling fine as far as these things go.  Rarely uses alprazolam. PMP AWARE reviewed today: most recent rx for alprazolam was filled 11/08/19, # 24, rx by me. No red flags.  Past Medical History:  Diagnosis Date  . Anxiety and depression 09/08/2011  . Female bladder prolapse   . GERD (gastroesophageal reflux disease) 09/05/2011   Improved signif with PPI  . History of adenomatous polyp of colon 08/2017   Recall 3-5 yrs  . History of Clostridium difficile infection 2007   colonic changes c/w with this dx seen on colonoscopy 2007---sx's responded to flagyl.  [name] Kitchen History of cyst of breast    multiple drained during menopause  . Hyperlipidemia   . IFG (impaired fasting glucose) 06/2018   Fastings 105-115 range when pt checked with husband's glucometer.  Fasting gluc here was 98 and A1c 5.6% 07/01/18.  . Insomnia 09/05/2011  . Migraine    worst during menopause  . Overweight (BMI 25.0-29.9) 09/05/2011  . Seasonal allergic rhinitis   . Subclinical hypothyroidism   . Vertigo, benign positional  09/08/2011    Past Surgical History:  Procedure Laterality Date  . CARDIOVASCULAR STRESS TEST  02/15/15   Exercise myocard perfusion testing: Normal EF, normal wall motion, normal perfusion.  Poor exercise capacity (4 min).  . COLONOSCOPY  07/2006; 08/25/17   2007: pseudomembranous colitis, o/w normal. (Dr. Ardis Hughs).  08/2017: adenomatous polyp--recall 3-5 yrs  . COSMETIC SURGERY    . DEXA  06/19/09; 01/19/2019   Normal 2010 and 2020.  [name] Kitchen POSTERIOR REPAIR  2012  . TONSILLECTOMY  82  . Transvaginal ultrasound  10/2016   Normal (done for LLQ pain by Dr. Benay Pike).  . TUBAL LIGATION  1983  . vaginal hernia repair  6-12   rectocele repair    Outpatient Medications Prior to Visit  Medication Sig Dispense Refill  . ALPRAZolam (XANAX) 0.25 MG tablet Take 1 tablet (0.25 mg total) by mouth 2 (two) times daily as needed. 90 tablet 1  . aspirin 81 MG tablet Take 81 mg by mouth daily.      . B Complex Vitamins (VITAMIN B-COMPLEX) TABS Takes 1/2 tablet daily    . Ferrous Sulfate (IRON) 325 (65 Fe) MG TABS Take by mouth daily.     [name] Kitchen loratadine (CLARITIN) 10 MG tablet Take 10 mg by mouth daily as needed. Spring and fall     . Magnesium 250 MG TABS Take 250 mg by mouth daily.    . meloxicam (MOBIC) 7.5 MG tablet  TAKE 1 TO 2 TABLETS EVERY DAY AS NEEDED FOR MUSCULOSKELETAL PAIN 60 tablet 5  . Multiple Vitamin (MULTIVITAMIN) tablet Take 1 tablet by mouth daily.      . Omega-3 Fatty Acids (FISH OIL PO) Take by mouth daily.    . pantoprazole (PROTONIX) 40 MG tablet Take 1 tablet (40 mg total) by mouth daily as needed. 30 tablet 5  . sertraline (ZOLOFT) 100 MG tablet TAKE 1.5 TABLETS BY MOUTH DAILY 135 tablet 3  . simvastatin (ZOCOR) 40 MG tablet TAKE 1 TABLET (40 MG TOTAL) BY MOUTH DAILY AT 6 PM. 90 tablet 1  . Calcium Carbonate-Vitamin D (CALCIUM 600+D) 600-400 MG-UNIT per tablet Take 1 tablet by mouth daily.       No facility-administered medications prior to visit.    No Known Allergies  ROS As  per HPI  PE: Vitals with BMI 05/23/2020 12/22/2019 11/08/2019  Height 5\' 4"  5\' 4"  5\' 4"   Weight 157 lbs 160 lbs 155 lbs 13 oz  BMI 26.94 [id number] Q000111Q  Systolic [id number]  Diastolic 79 79 68  Pulse 73 72 70    Gen: Alert, well appearing.  Patient is oriented to person, place, time, and situation. AFFECT: pleasant, lucid thought and speech. Knees: no swelling, erythema, or warmth of either knee. ROM of both knees fully intact w/out pain. No knee instability.  No joint line TTP. Patella movement normal, neg grind.  Patellar and quad tendons nontender.  Leg strength 5/5 prox and dist bilat.  McMurray's neg bilat.  Varus/valgus stress w/out pain or instability.  LABS:  Lab Results  Component Value Date   WBC 6.1 11/08/2019   HGB 14.3 11/08/2019   HCT 42.1 11/08/2019   MCV 94.0 11/08/2019   PLT 170.0 11/08/2019   Lab Results  Component Value Date   IRON 88 11/05/2017   IRON 80 11/05/2017   TIBC 302 11/05/2017   FERRITIN 60.4 11/05/2017     IMPRESSION AND PLAN:  1) Patellofemoral pain syndrome: educated pt on dx. Discussed home rehab exercises, handout given. May continue soft knee sleeve for comfort prn and meloxicam 7.5-15mg  qd prn. Signs/symptoms to call or return for were reviewed and pt expressed understanding.  2) Anx/dep: doing well on SSRI and low dose alpraz prn. No changes, no new rx's needed today.  An After Visit Summary was printed and given to the patient.  FOLLOW UP: Return if symptoms worsen or fail to improve.  Signed:  Crissie Sickles, MD           05/23/2020

## 2020-05-25 ENCOUNTER — Ambulatory Visit: Payer: Medicare PPO | Admitting: Family Medicine

## 2020-06-08 ENCOUNTER — Other Ambulatory Visit: Payer: Self-pay | Admitting: Family Medicine

## 2020-06-08 NOTE — Telephone Encounter (Signed)
RF request for meloxicam Last OV 05/23/20 No upcoming appointments Last RF 07/14/2019 # 60 x 5 RFS.  Please advise.

## 2020-07-17 DIAGNOSIS — Z03818 Encounter for observation for suspected exposure to other biological agents ruled out: Secondary | ICD-10-CM | POA: Diagnosis not present

## 2020-07-17 DIAGNOSIS — Z20822 Contact with and (suspected) exposure to covid-19: Secondary | ICD-10-CM | POA: Diagnosis not present

## 2020-09-13 DIAGNOSIS — H25813 Combined forms of age-related cataract, bilateral: Secondary | ICD-10-CM | POA: Diagnosis not present

## 2020-09-13 DIAGNOSIS — H10413 Chronic giant papillary conjunctivitis, bilateral: Secondary | ICD-10-CM | POA: Diagnosis not present

## 2020-09-13 DIAGNOSIS — H04123 Dry eye syndrome of bilateral lacrimal glands: Secondary | ICD-10-CM | POA: Diagnosis not present

## 2020-09-13 DIAGNOSIS — H43811 Vitreous degeneration, right eye: Secondary | ICD-10-CM | POA: Diagnosis not present

## 2020-10-16 ENCOUNTER — Other Ambulatory Visit: Payer: Self-pay | Admitting: Family Medicine

## 2020-11-03 ENCOUNTER — Other Ambulatory Visit: Payer: Self-pay | Admitting: Family Medicine

## 2020-11-06 ENCOUNTER — Other Ambulatory Visit: Payer: Self-pay

## 2020-11-06 ENCOUNTER — Ambulatory Visit (INDEPENDENT_AMBULATORY_CARE_PROVIDER_SITE_OTHER): Payer: Medicare PPO

## 2020-11-06 ENCOUNTER — Encounter: Payer: Self-pay | Admitting: Family Medicine

## 2020-11-06 DIAGNOSIS — Z23 Encounter for immunization: Secondary | ICD-10-CM | POA: Diagnosis not present

## 2020-12-22 DIAGNOSIS — I959 Hypotension, unspecified: Secondary | ICD-10-CM

## 2020-12-22 HISTORY — DX: Hypotension, unspecified: I95.9

## 2021-01-09 ENCOUNTER — Other Ambulatory Visit: Payer: Self-pay | Admitting: Family Medicine

## 2021-01-17 ENCOUNTER — Telehealth: Payer: Self-pay

## 2021-01-17 NOTE — Telephone Encounter (Signed)
Patient refill request.  Patient is due for CPE.  Scheduled on 02/08/21 with Dr. Anitra Lauth.  Please call in 30 d/s to get her thru until appt for physical.  sertraline (ZOLOFT) 100 MG tablet [[id number]]   simvastatin (ZOCOR) 40 MG tablet [[id number]]    [id number] - [place], [place]

## 2021-01-18 ENCOUNTER — Other Ambulatory Visit: Payer: Self-pay | Admitting: Family Medicine

## 2021-01-18 MED ORDER — SIMVASTATIN 40 MG PO TABS: 40.0000 mg | ORAL_TABLET | Freq: Every day | ORAL | 0 refills | Status: DC

## 2021-01-18 MED ORDER — SERTRALINE HCL 100 MG PO TABS: ORAL_TABLET | ORAL | 0 refills | Status: DC

## 2021-01-18 NOTE — Telephone Encounter (Signed)
Rx sent to last til appt

## 2021-02-07 ENCOUNTER — Other Ambulatory Visit: Payer: Self-pay | Admitting: Obstetrics and Gynecology

## 2021-02-07 ENCOUNTER — Other Ambulatory Visit: Payer: Self-pay

## 2021-02-07 DIAGNOSIS — Z1231 Encounter for screening mammogram for malignant neoplasm of breast: Secondary | ICD-10-CM

## 2021-02-08 ENCOUNTER — Ambulatory Visit (INDEPENDENT_AMBULATORY_CARE_PROVIDER_SITE_OTHER): Payer: Medicare PPO | Admitting: Family Medicine

## 2021-02-08 ENCOUNTER — Encounter: Payer: Self-pay | Admitting: Family Medicine

## 2021-02-08 VITALS — BP 104/68 | HR 80 | Temp 97.3°F | Resp 16 | Ht 63.0 in | Wt 157.0 lb

## 2021-02-08 DIAGNOSIS — F32A Depression, unspecified: Secondary | ICD-10-CM | POA: Diagnosis not present

## 2021-02-08 DIAGNOSIS — E7800 Pure hypercholesterolemia, unspecified: Secondary | ICD-10-CM | POA: Diagnosis not present

## 2021-02-08 DIAGNOSIS — Z0000 Encounter for general adult medical examination without abnormal findings: Secondary | ICD-10-CM

## 2021-02-08 DIAGNOSIS — K219 Gastro-esophageal reflux disease without esophagitis: Secondary | ICD-10-CM | POA: Diagnosis not present

## 2021-02-08 DIAGNOSIS — F419 Anxiety disorder, unspecified: Secondary | ICD-10-CM

## 2021-02-08 LAB — CBC WITH DIFFERENTIAL/PLATELET
Basophils Absolute: 0 10*3/uL (ref 0.0–0.1)
Basophils Relative: 0.7 % (ref 0.0–3.0)
Eosinophils Absolute: 0.1 10*3/uL (ref 0.0–0.7)
Eosinophils Relative: 1.7 % (ref 0.0–5.0)
HCT: 45.3 % (ref 36.0–46.0)
Hemoglobin: 15.3 g/dL — ABNORMAL HIGH (ref 12.0–15.0)
Lymphocytes Relative: 36.6 % (ref 12.0–46.0)
Lymphs Abs: 2.4 10*3/uL (ref 0.7–4.0)
MCHC: 33.7 g/dL (ref 30.0–36.0)
MCV: 93 fl (ref 78.0–100.0)
Monocytes Absolute: 0.5 10*3/uL (ref 0.1–1.0)
Monocytes Relative: 7.7 % (ref 3.0–12.0)
Neutro Abs: 3.5 10*3/uL (ref 1.4–7.7)
Neutrophils Relative %: 53.3 % (ref 43.0–77.0)
Platelets: 167 10*3/uL (ref 150.0–400.0)
RBC: 4.87 Mil/uL (ref 3.87–5.11)
RDW: 13 % (ref 11.5–15.5)
WBC: 6.6 10*3/uL (ref 4.0–10.5)

## 2021-02-08 LAB — COMPREHENSIVE METABOLIC PANEL
ALT: 35 U/L (ref 0–35)
AST: 28 U/L (ref 0–37)
Albumin: 4.6 g/dL (ref 3.5–5.2)
Alkaline Phosphatase: 53 U/L (ref 39–117)
BUN: 15 mg/dL (ref 6–23)
CO2: 28 mEq/L (ref 19–32)
Calcium: 9.8 mg/dL (ref 8.4–10.5)
Chloride: 105 mEq/L (ref 96–112)
Creatinine, Ser: 0.73 mg/dL (ref 0.40–1.20)
GFR: 84.02 mL/min (ref >60.00)
Glucose, Bld: 95 mg/dL (ref 70–99)
Potassium: 4.1 mEq/L (ref 3.5–5.1)
Sodium: 141 mEq/L (ref 135–145)
Total Bilirubin: 0.5 mg/dL (ref 0.2–1.2)
Total Protein: 7.4 g/dL (ref 6.0–8.3)

## 2021-02-08 LAB — TSH: TSH: 3.57 u[IU]/mL (ref 0.35–4.50)

## 2021-02-08 LAB — LIPID PANEL
Cholesterol: 205 mg/dL — ABNORMAL HIGH (ref 0–200)
HDL: 48.9 mg/dL (ref >39.00)
LDL Cholesterol: 118 mg/dL — ABNORMAL HIGH (ref 0–99)
NonHDL: 155.84
Total CHOL/HDL Ratio: 4
Triglycerides: 191 mg/dL — ABNORMAL HIGH (ref 0.0–149.0)
VLDL: 38.2 mg/dL (ref 0.0–40.0)

## 2021-02-08 MED ORDER — ALPRAZOLAM 0.25 MG PO TABS: 0.2500 mg | ORAL_TABLET | Freq: Two times a day (BID) | ORAL | 1 refills | Status: DC | PRN

## 2021-02-08 MED ORDER — SERTRALINE HCL 100 MG PO TABS: ORAL_TABLET | ORAL | 11 refills | Status: DC

## 2021-02-08 NOTE — Progress Notes (Signed)
Office Note 02/08/2021  CC:  Chief Complaint  Patient presents with  . Annual Exam    Pt is fasting    HPI:  Barbara Munoz is a 70 y.o. White female who is here for annual health maintenance exam and routine f/u anx/dep, GERD, and HLD. A/P as of last visit: "1) Patellofemoral pain syndrome: educated pt on dx. Discussed home rehab exercises, handout given. May continue soft knee sleeve for comfort prn and meloxicam 7.5-15mg  qd prn. Signs/symptoms to call or return for were reviewed and pt expressed understanding.  2) Anx/dep: doing well on SSRI and low dose alpraz prn. No changes, no new rx's needed today."  INTERIM HX: Overall she's doing ok. Trying to eat sensibly, remains active.  HLD: tolerating simvastatin 40mg  qd.  Mood more gloomy in winter.  Feels inner tension chronically but doesn't seem to think it correlates to periods of worry/anxiety.  Restless, can't sleep.  Puts lots of pressure on herself.  Can't relax/rest.  Martin Majestic out on her deck the other day and just screamed and felt better. Not sleeping well.   Sertraline 150mg  qd long term has been helpful for her chronic anxiety. Taking alpraz only occ. PMP AWARE reviewed today: most recent rx for alprazolam 0.25mg  was filled 11/08/19, # 15, rx by me. No red flags.  Past Medical History:  Diagnosis Date  . Anxiety and depression 09/08/2011  . Female bladder prolapse   . GERD (gastroesophageal reflux disease) 09/05/2011   Improved signif with PPI  . History of adenomatous polyp of colon 08/2017   Recall 3-5 yrs  . History of Clostridium difficile infection 2007   colonic changes c/w with this dx seen on colonoscopy 2007---sx's responded to flagyl.  [name] Kitchen History of cyst of breast    multiple drained during menopause  . Hyperlipidemia   . IFG (impaired fasting glucose) 06/2018   Fastings 105-115 range when pt checked with husband's glucometer.  Fasting gluc here was 98 and A1c 5.6% 07/01/18.  . Insomnia 09/05/2011   . Migraine    worst during menopause  . Overweight (BMI 25.0-29.9) 09/05/2011  . Seasonal allergic rhinitis   . Subclinical hypothyroidism   . Vertigo, benign positional 09/08/2011    Past Surgical History:  Procedure Laterality Date  . CARDIOVASCULAR STRESS TEST  02/15/15   Exercise myocard perfusion testing: Normal EF, normal wall motion, normal perfusion.  Poor exercise capacity (4 min).  . COLONOSCOPY  07/2006; 08/25/17   2007: pseudomembranous colitis, o/w normal. (Dr. Ardis Hughs).  08/2017: adenomatous polyp--recall 3-5 yrs  . COSMETIC SURGERY    . DEXA  06/19/09; 01/19/2019   Normal 2010 and 2020.  [name] Kitchen POSTERIOR REPAIR  2012  . TONSILLECTOMY  82  . Transvaginal ultrasound  10/2016   Normal (done for LLQ pain by Dr. Benay Pike).  . TUBAL LIGATION  1983  . vaginal hernia repair  6-12   rectocele repair    Family History  Problem Relation Age of Onset  . Diabetes Mother 102       type 2  . Aneurysm Father        aortic  . Cancer Father        prostate and lung/ smoker  . Pernicious anemia Father   . Hyperlipidemia Sister   . Hypertension Sister   . Diabetes Sister        type 2  . Other Sister        degenerative disc disease  . Thyroid disease Sister   . Cancer  Sister        breast  cancer s/p dbl mastectomy doing well  . Hyperlipidemia Brother   . Allergies Brother   . Diabetes Daughter        type 1  . Breast cancer Maternal Grandmother   . Heart attack Maternal Grandfather   . Heart disease Paternal Grandmother        CHF  . Pernicious anemia Paternal Grandmother   . Kidney disease Paternal Grandfather   . Stroke Paternal Grandfather   . Hypertension Sister   . Hyperlipidemia Sister   . Diabetes Sister        type 2  . Allergies Daughter   . Breast cancer Sister   . Colon cancer Neg Hx   . Esophageal cancer Neg Hx   . Stomach cancer Neg Hx   . Rectal cancer Neg Hx     Social History   Socioeconomic History  . Marital status: Married    Spouse name:  Not on file  . Number of children: Not on file  . Years of education: Not on file  . Highest education level: Not on file  Occupational History  . Not on file  Tobacco Use  . Smoking status: Never Smoker  . Smokeless tobacco: Never Used  . Tobacco comment: smoked for about 6 months  Vaping Use  . Vaping Use: Never used  Substance and Sexual Activity  . Alcohol use: No    Comment: rarely  . Drug use: No  . Sexual activity: Yes    Birth control/protection: Post-menopausal  Other Topics Concern  . Not on file  Social History Narrative   Married.   Nonsmoker.   Social Determinants of Health   Financial Resource Strain: Not on file  Food Insecurity: Not on file  Transportation Needs: Not on file  Physical Activity: Not on file  Stress: Not on file  Social Connections: Not on file  Intimate Partner Violence: Not on file    Outpatient Medications Prior to Visit  Medication Sig Dispense Refill  . aspirin 81 MG tablet Take 81 mg by mouth daily.    . B Complex Vitamins (VITAMIN B-COMPLEX) TABS Takes 1/2 tablet daily    . Calcium Carbonate-Vitamin D 600-400 MG-UNIT tablet Take 1 tablet by mouth daily.    . Ferrous Sulfate (IRON) 325 (65 Fe) MG TABS Take by mouth daily.     [name] Kitchen loratadine (CLARITIN) 10 MG tablet Take 10 mg by mouth daily as needed. Spring and fall    . Magnesium 250 MG TABS Take 250 mg by mouth daily.    . meloxicam (MOBIC) 7.5 MG tablet TAKE 1 TO 2 TABLETS EVERY DAY AS NEEDED FOR MUSCULOSKELETAL PAIN 60 tablet 5  . Multiple Vitamin (MULTIVITAMIN) tablet Take 1 tablet by mouth daily.    . Omega-3 Fatty Acids (FISH OIL PO) Take by mouth daily.    . pantoprazole (PROTONIX) 40 MG tablet Take 1 tablet (40 mg total) by mouth daily as needed. 30 tablet 5  . simvastatin (ZOCOR) 40 MG tablet Take 1 tablet (40 mg total) by mouth daily at 6 PM. 20 tablet 0  . ALPRAZolam (XANAX) 0.25 MG tablet Take 1 tablet (0.25 mg total) by mouth 2 (two) times daily as needed. 90 tablet 1   . sertraline (ZOLOFT) 100 MG tablet TAKE 1 AND 1/2 TABLETS BY MOUTH EVERY DAY 30 tablet 0   No facility-administered medications prior to visit.    No Known Allergies  ROS Review of  Systems  Constitutional: Negative for appetite change, chills, fatigue and fever.  HENT: Negative for congestion, dental problem, ear pain and sore throat.   Eyes: Negative for discharge, redness and visual disturbance.  Respiratory: Negative for cough, chest tightness, shortness of breath and wheezing.   Cardiovascular: Negative for chest pain, palpitations and leg swelling.  Gastrointestinal: Negative for abdominal pain, blood in stool, diarrhea, nausea and vomiting.  Genitourinary: Negative for difficulty urinating, dysuria, flank pain, frequency, hematuria and urgency.  Musculoskeletal: Negative for arthralgias, back pain, joint swelling, myalgias and neck stiffness.  Skin: Negative for pallor and rash.  Neurological: Negative for dizziness, speech difficulty, weakness and headaches.  Hematological: Negative for adenopathy. Does not bruise/bleed easily.  Psychiatric/Behavioral: Negative for confusion and sleep disturbance. The patient is not nervous/anxious.     PE; Vitals with BMI 02/08/2021 05/23/2020 12/22/2019  Height 5\' 3"  5\' 4"  5\' 4"   Weight 157 lbs 157 lbs 160 lbs  BMI 27.82 81.15 72.62  Systolic 035 597 416  Diastolic 68 79 79  Pulse 80 73 72   Exam chaperoned by Deveron Furlong, CMA.  Gen: Alert, well appearing.  Patient is oriented to person, place, time, and situation. AFFECT: pleasant, lucid thought and speech. ENT: Ears: EACs clear, normal epithelium.  TMs with good light reflex and landmarks bilaterally.  Eyes: no injection, icteris, swelling, or exudate.  EOMI, PERRLA. Nose: no drainage or turbinate edema/swelling.  No injection or focal lesion.  Mouth: lips without lesion/swelling.  Oral mucosa pink and moist.  Dentition intact and without obvious caries or gingival swelling.   Oropharynx without erythema, exudate, or swelling.  Neck: supple/nontender.  No LAD, mass, or TM.  Carotid pulses 2+ bilaterally, without bruits. CV: RRR, no m/r/g.   LUNGS: CTA bilat, nonlabored resps, good aeration in all lung fields. ABD: soft, NT, ND, BS normal.  No hepatospenomegaly or mass.  No bruits. EXT: no clubbing, cyanosis, or edema.  Musculoskeletal: no joint swelling, erythema, warmth, or tenderness.  ROM of all joints intact. Skin - no sores or suspicious lesions or rashes or color changes   Pertinent labs:  Lab Results  Component Value Date   TSH 1.96 11/08/2019   Lab Results  Component Value Date   WBC 6.1 11/08/2019   HGB 14.3 11/08/2019   HCT 42.1 11/08/2019   MCV 94.0 11/08/2019   PLT 170.0 11/08/2019   Lab Results  Component Value Date   CREATININE 0.70 11/08/2019   BUN 16 11/08/2019   NA 140 11/08/2019   K 4.6 11/08/2019   CL 105 11/08/2019   CO2 27 11/08/2019   Lab Results  Component Value Date   ALT 34 11/08/2019   AST 29 11/08/2019   ALKPHOS 55 11/08/2019   BILITOT 0.5 11/08/2019   Lab Results  Component Value Date   CHOL 168 12/22/2019   Lab Results  Component Value Date   HDL 49.30 12/22/2019   Lab Results  Component Value Date   LDLCALC 88 12/22/2019   Lab Results  Component Value Date   TRIG 153.0 (H) 12/22/2019   Lab Results  Component Value Date   CHOLHDL 3 12/22/2019   Lab Results  Component Value Date   HGBA1C 5.4 11/08/2019   ASSESSMENT AND PLAN:   1) Anxiety/depression: she is hard on herself. Plan is to inc sertraline to TWO full 100mg  tabs qhs and I encouraged her to use her alprazolam more often prn inc tension/anxiety/stress/poor sleep.   Also can try melatonin 5-10mg  qhs.  2)  HLD: tolerating simvastatin 40mg  qd long term. FLP and hepatic panel today.  3) Health maintenance exam: Reviewed age and gender appropriate health maintenance issues (prudent diet, regular exercise, health risks of tobacco and  excessive alcohol, use of seatbelts, fire alarms in home, use of sunscreen).  Also reviewed age and gender appropriate health screening as well as vaccine recommendations. Vaccines: Shingrix->send rx to pharmacy.  Otherwise ALL UTD. Labs: cbc, cmet, flp, tsh. Cervical ca screening: no hx of abnl's->per Dr. Benay Pike. Breast ca screening: no hx of abnl's-->per Dr. Benay Pike. Colon ca screening: hx polyps on 2018 TCS->recall 3-5 yrs per GI. Osteoporosis screening: DEXA 12/2018 normal bone density->per Dr. Benay Pike. An After Visit Summary was printed and given to the patient.  FOLLOW UP:  Return in about 6 months (around 08/08/2021) for routine chronic illness f/u.  Signed:  Crissie Sickles, MD           02/08/2021

## 2021-02-08 NOTE — Patient Instructions (Signed)
Health Maintenance, Female Adopting a healthy lifestyle and getting preventive care are important in promoting health and wellness. Ask your health care provider about:  The right schedule for you to have regular tests and exams.  Things you can do on your own to prevent diseases and keep yourself healthy. What should I know about diet, weight, and exercise? Eat a healthy diet  Eat a diet that includes plenty of vegetables, fruits, low-fat dairy products, and lean protein.  Do not eat a lot of foods that are high in solid fats, added sugars, or sodium.   Maintain a healthy weight Body mass index (BMI) is used to identify weight problems. It estimates body fat based on height and weight. Your health care provider can help determine your BMI and help you achieve or maintain a healthy weight. Get regular exercise Get regular exercise. This is one of the most important things you can do for your health. Most adults should:  Exercise for at least 150 minutes each week. The exercise should increase your heart rate and make you sweat (moderate-intensity exercise).  Do strengthening exercises at least twice a week. This is in addition to the moderate-intensity exercise.  Spend less time sitting. Even light physical activity can be beneficial. Watch cholesterol and blood lipids Have your blood tested for lipids and cholesterol at 70 years of age, then have this test every 5 years. Have your cholesterol levels checked more often if:  Your lipid or cholesterol levels are high.  You are older than 70 years of age.  You are at high risk for heart disease. What should I know about cancer screening? Depending on your health history and family history, you may need to have cancer screening at various ages. This may include screening for:  Breast cancer.  Cervical cancer.  Colorectal cancer.  Skin cancer.  Lung cancer. What should I know about heart disease, diabetes, and high blood  pressure? Blood pressure and heart disease  High blood pressure causes heart disease and increases the risk of stroke. This is more likely to develop in people who have high blood pressure readings, are of African descent, or are overweight.  Have your blood pressure checked: ? Every 3-5 years if you are 18-39 years of age. ? Every year if you are 40 years old or older. Diabetes Have regular diabetes screenings. This checks your fasting blood sugar level. Have the screening done:  Once every three years after age 40 if you are at a normal weight and have a low risk for diabetes.  More often and at a younger age if you are overweight or have a high risk for diabetes. What should I know about preventing infection? Hepatitis B If you have a higher risk for hepatitis B, you should be screened for this virus. Talk with your health care provider to find out if you are at risk for hepatitis B infection. Hepatitis C Testing is recommended for:  Everyone born from 1945 through 1965.  Anyone with known risk factors for hepatitis C. Sexually transmitted infections (STIs)  Get screened for STIs, including gonorrhea and chlamydia, if: ? You are sexually active and are younger than 70 years of age. ? You are older than 70 years of age and your health care provider tells you that you are at risk for this type of infection. ? Your sexual activity has changed since you were last screened, and you are at increased risk for chlamydia or gonorrhea. Ask your health care provider   if you are at risk.  Ask your health care provider about whether you are at high risk for HIV. Your health care provider may recommend a prescription medicine to help prevent HIV infection. If you choose to take medicine to prevent HIV, you should first get tested for HIV. You should then be tested every 3 months for as long as you are taking the medicine. Pregnancy  If you are about to stop having your period (premenopausal) and  you may become pregnant, seek counseling before you get pregnant.  Take 400 to 800 micrograms (mcg) of folic acid every day if you become pregnant.  Ask for birth control (contraception) if you want to prevent pregnancy. Osteoporosis and menopause Osteoporosis is a disease in which the bones lose minerals and strength with aging. This can result in bone fractures. If you are 65 years old or older, or if you are at risk for osteoporosis and fractures, ask your health care provider if you should:  Be screened for bone loss.  Take a calcium or vitamin D supplement to lower your risk of fractures.  Be given hormone replacement therapy (HRT) to treat symptoms of menopause. Follow these instructions at home: Lifestyle  Do not use any products that contain nicotine or tobacco, such as cigarettes, e-cigarettes, and chewing tobacco. If you need help quitting, ask your health care provider.  Do not use street drugs.  Do not share needles.  Ask your health care provider for help if you need support or information about quitting drugs. Alcohol use  Do not drink alcohol if: ? Your health care provider tells you not to drink. ? You are pregnant, may be pregnant, or are planning to become pregnant.  If you drink alcohol: ? Limit how much you use to 0-1 drink a day. ? Limit intake if you are breastfeeding.  Be aware of how much alcohol is in your drink. In the [place] one drink equals one 12 oz bottle of beer (355 mL), one 5 oz glass of wine (148 mL), or one 1 oz glass of hard liquor (44 mL). General instructions  Schedule regular health, dental, and eye exams.  Stay current with your vaccines.  Tell your health care provider if: ? You often feel depressed. ? You have ever been abused or do not feel safe at home. Summary  Adopting a healthy lifestyle and getting preventive care are important in promoting health and wellness.  Follow your health care provider's instructions about healthy  diet, exercising, and getting tested or screened for diseases.  Follow your health care provider's instructions on monitoring your cholesterol and blood pressure. This information is not intended to replace advice given to you by your health care provider. Make sure you discuss any questions you have with your health care provider. Document Revised: 12/01/2018 Document Reviewed: 12/01/2018 [employer] Patient Education  2021 [employer]  

## 2021-02-12 DIAGNOSIS — Z01419 Encounter for gynecological examination (general) (routine) without abnormal findings: Secondary | ICD-10-CM | POA: Diagnosis not present

## 2021-02-12 DIAGNOSIS — Z1211 Encounter for screening for malignant neoplasm of colon: Secondary | ICD-10-CM | POA: Diagnosis not present

## 2021-02-12 DIAGNOSIS — R32 Unspecified urinary incontinence: Secondary | ICD-10-CM | POA: Diagnosis not present

## 2021-02-12 DIAGNOSIS — Z1231 Encounter for screening mammogram for malignant neoplasm of breast: Secondary | ICD-10-CM | POA: Diagnosis not present

## 2021-02-28 DIAGNOSIS — R3915 Urgency of urination: Secondary | ICD-10-CM | POA: Diagnosis not present

## 2021-02-28 DIAGNOSIS — R159 Full incontinence of feces: Secondary | ICD-10-CM | POA: Diagnosis not present

## 2021-02-28 DIAGNOSIS — N393 Stress incontinence (female) (male): Secondary | ICD-10-CM | POA: Diagnosis not present

## 2021-02-28 DIAGNOSIS — M6281 Muscle weakness (generalized): Secondary | ICD-10-CM | POA: Diagnosis not present

## 2021-02-28 DIAGNOSIS — N3941 Urge incontinence: Secondary | ICD-10-CM | POA: Diagnosis not present

## 2021-03-04 ENCOUNTER — Other Ambulatory Visit: Payer: Self-pay | Admitting: Family Medicine

## 2021-03-11 ENCOUNTER — Other Ambulatory Visit: Payer: Self-pay | Admitting: Family Medicine

## 2021-03-25 DIAGNOSIS — M6281 Muscle weakness (generalized): Secondary | ICD-10-CM | POA: Diagnosis not present

## 2021-03-25 DIAGNOSIS — N393 Stress incontinence (female) (male): Secondary | ICD-10-CM | POA: Diagnosis not present

## 2021-03-25 DIAGNOSIS — N3941 Urge incontinence: Secondary | ICD-10-CM | POA: Diagnosis not present

## 2021-03-25 DIAGNOSIS — R159 Full incontinence of feces: Secondary | ICD-10-CM | POA: Diagnosis not present

## 2021-03-25 DIAGNOSIS — R3915 Urgency of urination: Secondary | ICD-10-CM | POA: Diagnosis not present

## 2021-03-28 ENCOUNTER — Inpatient Hospital Stay: Admission: RE | Admit: 2021-03-28 | Payer: Medicare PPO | Source: Ambulatory Visit

## 2021-04-01 DIAGNOSIS — N393 Stress incontinence (female) (male): Secondary | ICD-10-CM | POA: Diagnosis not present

## 2021-04-01 DIAGNOSIS — R159 Full incontinence of feces: Secondary | ICD-10-CM | POA: Diagnosis not present

## 2021-04-01 DIAGNOSIS — M6281 Muscle weakness (generalized): Secondary | ICD-10-CM | POA: Diagnosis not present

## 2021-04-01 DIAGNOSIS — R3915 Urgency of urination: Secondary | ICD-10-CM | POA: Diagnosis not present

## 2021-04-01 DIAGNOSIS — N3941 Urge incontinence: Secondary | ICD-10-CM | POA: Diagnosis not present

## 2021-04-08 DIAGNOSIS — N3941 Urge incontinence: Secondary | ICD-10-CM | POA: Diagnosis not present

## 2021-04-08 DIAGNOSIS — M6281 Muscle weakness (generalized): Secondary | ICD-10-CM | POA: Diagnosis not present

## 2021-04-08 DIAGNOSIS — R159 Full incontinence of feces: Secondary | ICD-10-CM | POA: Diagnosis not present

## 2021-04-08 DIAGNOSIS — R3915 Urgency of urination: Secondary | ICD-10-CM | POA: Diagnosis not present

## 2021-04-08 DIAGNOSIS — N393 Stress incontinence (female) (male): Secondary | ICD-10-CM | POA: Diagnosis not present

## 2021-04-17 ENCOUNTER — Other Ambulatory Visit: Payer: Self-pay

## 2021-04-17 ENCOUNTER — Ambulatory Visit (INDEPENDENT_AMBULATORY_CARE_PROVIDER_SITE_OTHER): Payer: Medicare PPO

## 2021-04-17 VITALS — BP 124/80 | HR 70 | Temp 96.5°F | Resp 16 | Ht 63.0 in | Wt 157.4 lb

## 2021-04-17 DIAGNOSIS — Z0000 Encounter for general adult medical examination without abnormal findings: Secondary | ICD-10-CM

## 2021-04-17 NOTE — Patient Instructions (Signed)
Barbara Munoz , Thank you for taking time to come for your Medicare Wellness Visit. I appreciate your ongoing commitment to your health goals. Please review the following plan we discussed and let me know if I can assist you in the future.   Screening recommendations/referrals: Colonoscopy: Completed 08/25/2017-Due 09/15/2027 Mammogram: Scheduled for 05/17/2021 Bone Density: Due-Please call the office when you are ready to schedule. Recommended yearly ophthalmology/optometry visit for glaucoma screening and checkup Recommended yearly dental visit for hygiene and checkup  Vaccinations: Influenza vaccine: Up to date Pneumococcal vaccine: Completed vaccines Tdap vaccine: Up to date-Due-06/26/2026 Shingles vaccine: Discuss with pharmacy   Covid-19:Up to date  Advanced directives: Please bring a copy for your chart  Conditions/risks identified: See problem list  Next appointment: Follow up in one year for your annual wellness visit    Preventive Care 65 Years and Older, Female Preventive care refers to lifestyle choices and visits with your health care provider that can promote health and wellness. What does preventive care include?  A yearly physical exam. This is also called an annual well check.  Dental exams once or twice a year.  Routine eye exams. Ask your health care provider how often you should have your eyes checked.  Personal lifestyle choices, including:  Daily care of your teeth and gums.  Regular physical activity.  Eating a healthy diet.  Avoiding tobacco and drug use.  Limiting alcohol use.  Practicing safe sex.  Taking low-dose aspirin every day.  Taking vitamin and mineral supplements as recommended by your health care provider. What happens during an annual well check? The services and screenings done by your health care provider during your annual well check will depend on your age, overall health, lifestyle risk factors, and family history of  disease. Counseling  Your health care provider may ask you questions about your:  Alcohol use.  Tobacco use.  Drug use.  Emotional well-being.  Home and relationship well-being.  Sexual activity.  Eating habits.  History of falls.  Memory and ability to understand (cognition).  Work and work Statistician.  Reproductive health. Screening  You may have the following tests or measurements:  Height, weight, and BMI.  Blood pressure.  Lipid and cholesterol levels. These may be checked every 5 years, or more frequently if you are over 52 years old.  Skin check.  Lung cancer screening. You may have this screening every year starting at age 54 if you have a 30-pack-year history of smoking and currently smoke or have quit within the past 15 years.  Fecal occult blood test (FOBT) of the stool. You may have this test every year starting at age 39.  Flexible sigmoidoscopy or colonoscopy. You may have a sigmoidoscopy every 5 years or a colonoscopy every 10 years starting at age 65.  Hepatitis C blood test.  Hepatitis B blood test.  Sexually transmitted disease (STD) testing.  Diabetes screening. This is done by checking your blood sugar (glucose) after you have not eaten for a while (fasting). You may have this done every 1-3 years.  Bone density scan. This is done to screen for osteoporosis. You may have this done starting at age 83.  Mammogram. This may be done every 1-2 years. Talk to your health care provider about how often you should have regular mammograms. Talk with your health care provider about your test results, treatment options, and if necessary, the need for more tests. Vaccines  Your health care provider may recommend certain vaccines, such as:  Influenza  vaccine. This is recommended every year.  Tetanus, diphtheria, and acellular pertussis (Tdap, Td) vaccine. You may need a Td booster every 10 years.  Zoster vaccine. You may need this after age  23.  Pneumococcal 13-valent conjugate (PCV13) vaccine. One dose is recommended after age 1.  Pneumococcal polysaccharide (PPSV23) vaccine. One dose is recommended after age 74. Talk to your health care provider about which screenings and vaccines you need and how often you need them. This information is not intended to replace advice given to you by your health care provider. Make sure you discuss any questions you have with your health care provider. Document Released: 01/04/2016 Document Revised: 08/27/2016 Document Reviewed: 10/09/2015 Elsevier Interactive Patient Education  2017 [place] Prevention in the Home Falls can cause injuries. They can happen to people of all ages. There are many things you can do to make your home safe and to help prevent falls. What can I do on the outside of my home?  Regularly fix the edges of walkways and driveways and fix any cracks.  Remove anything that might make you trip as you walk through a door, such as a raised step or threshold.  Trim any bushes or trees on the path to your home.  Use bright outdoor lighting.  Clear any walking paths of anything that might make someone trip, such as rocks or tools.  Regularly check to see if handrails are loose or broken. Make sure that both sides of any steps have handrails.  Any raised decks and porches should have guardrails on the edges.  Have any leaves, snow, or ice cleared regularly.  Use sand or salt on walking paths during winter.  Clean up any spills in your garage right away. This includes oil or grease spills. What can I do in the bathroom?  Use night lights.  Install grab bars by the toilet and in the tub and shower. Do not use towel bars as grab bars.  Use non-skid mats or decals in the tub or shower.  If you need to sit down in the shower, use a plastic, non-slip stool.  Keep the floor dry. Clean up any water that spills on the floor as soon as it happens.  Remove  soap buildup in the tub or shower regularly.  Attach bath mats securely with double-sided non-slip rug tape.  Do not have throw rugs and other things on the floor that can make you trip. What can I do in the bedroom?  Use night lights.  Make sure that you have a light by your bed that is easy to reach.  Do not use any sheets or blankets that are too big for your bed. They should not hang down onto the floor.  Have a firm chair that has side arms. You can use this for support while you get dressed.  Do not have throw rugs and other things on the floor that can make you trip. What can I do in the kitchen?  Clean up any spills right away.  Avoid walking on wet floors.  Keep items that you use a lot in easy-to-reach places.  If you need to reach something above you, use a strong step stool that has a grab bar.  Keep electrical cords out of the way.  Do not use floor polish or wax that makes floors slippery. If you must use wax, use non-skid floor wax.  Do not have throw rugs and other things on the floor that can  make you trip. What can I do with my stairs?  Do not leave any items on the stairs.  Make sure that there are handrails on both sides of the stairs and use them. Fix handrails that are broken or loose. Make sure that handrails are as long as the stairways.  Check any carpeting to make sure that it is firmly attached to the stairs. Fix any carpet that is loose or worn.  Avoid having throw rugs at the top or bottom of the stairs. If you do have throw rugs, attach them to the floor with carpet tape.  Make sure that you have a light switch at the top of the stairs and the bottom of the stairs. If you do not have them, ask someone to add them for you. What else can I do to help prevent falls?  Wear shoes that:  Do not have high heels.  Have rubber bottoms.  Are comfortable and fit you well.  Are closed at the toe. Do not wear sandals.  If you use a  stepladder:  Make sure that it is fully opened. Do not climb a closed stepladder.  Make sure that both sides of the stepladder are locked into place.  Ask someone to hold it for you, if possible.  Clearly mark and make sure that you can see:  Any grab bars or handrails.  First and last steps.  Where the edge of each step is.  Use tools that help you move around (mobility aids) if they are needed. These include:  Canes.  Walkers.  Scooters.  Crutches.  Turn on the lights when you go into a dark area. Replace any light bulbs as soon as they burn out.  Set up your furniture so you have a clear path. Avoid moving your furniture around.  If any of your floors are uneven, fix them.  If there are any pets around you, be aware of where they are.  Review your medicines with your doctor. Some medicines can make you feel dizzy. This can increase your chance of falling. Ask your doctor what other things that you can do to help prevent falls. This information is not intended to replace advice given to you by your health care provider. Make sure you discuss any questions you have with your health care provider. Document Released: 10/04/2009 Document Revised: 05/15/2016 Document Reviewed: 01/12/2015 Elsevier Interactive Patient Education  2017 [employer].

## 2021-04-17 NOTE — Progress Notes (Signed)
Subjective:   JALIZA SEIFRIED is a 70 y.o. female who presents for an Initial Medicare Annual Wellness Visit.  Review of Systems     Cardiac Risk Factors include: advanced age (>67mn, >>64women);dyslipidemia;sedentary lifestyle     Objective:    Today's Vitals   04/17/21 1450  BP: 124/80  Pulse: 70  Resp: 16  Temp: (!) 96.5 F (35.8 C)  TempSrc: Temporal  SpO2: 96%  Weight: 157 lb 6.4 oz (71.4 kg)  Height: '5\' 3"'  (1.6 m)   Body mass index is 27.88 kg/m.  Advanced Directives 04/17/2021 03/30/2017 08/15/2015  Does Patient Have a Medical Advance Directive? Yes Yes No  [place] - -  [place] of [place]? No - copy requested - -    Current Medications (verified) Outpatient Encounter Medications as of 04/17/2021  Medication Sig  . ALPRAZolam (XANAX) 0.25 MG tablet Take 1 tablet (0.25 mg total) by mouth 2 (two) times daily as needed.  .[name] Kitchenaspirin 81 MG tablet Take 81 mg by mouth daily.  . B Complex Vitamins (VITAMIN B-COMPLEX) TABS Takes 1/2 tablet daily  . Calcium Carbonate-Vitamin D 600-400 MG-UNIT tablet Take 1 tablet by mouth daily.  . Ferrous Sulfate (IRON) 325 (65 Fe) MG TABS Take by mouth daily.   .[name] Kitchenloratadine (CLARITIN) 10 MG tablet Take 10 mg by mouth daily as needed. Spring and fall  . Magnesium 250 MG TABS Take 250 mg by mouth daily.  . Melatonin 10 MG TABS Take 5 mg by mouth.  . meloxicam (MOBIC) 7.5 MG tablet TAKE 1 TO 2 TABLETS EVERY DAY AS NEEDED FOR MUSCULOSKELETAL PAIN  . Multiple Vitamin (MULTIVITAMIN) tablet Take 1 tablet by mouth daily.  . Omega-3 Fatty Acids (FISH OIL PO) Take by mouth daily.  . pantoprazole (PROTONIX) 40 MG tablet Take 1 tablet (40 mg total) by mouth daily as needed.  . sertraline (ZOLOFT) 100 MG tablet 2 tabs po qhs  . simvastatin (ZOCOR) 40 MG tablet TAKE 1 TABLET BY MOUTH DAILY AT 6 PM.   No facility-administered encounter medications on file as of  04/17/2021.    Allergies (verified) Patient has no known allergies.   History: Past Medical History:  Diagnosis Date  . Anxiety and depression 09/08/2011  . Female bladder prolapse   . GERD (gastroesophageal reflux disease) 09/05/2011   Improved signif with PPI  . History of adenomatous polyp of colon 08/2017   Recall 3-5 yrs  . History of Clostridium difficile infection 2007   colonic changes c/w with this dx seen on colonoscopy 2007---sx's responded to flagyl.  .[name] KitchenHistory of cyst of breast    multiple drained during menopause  . Hyperlipidemia   . IFG (impaired fasting glucose) 06/2018   Fastings 105-115 range when pt checked with husband's glucometer.  Fasting gluc here was 98 and A1c 5.6% 07/01/18.  . Insomnia 09/05/2011  . Migraine    worst during menopause  . Overweight (BMI 25.0-29.9) 09/05/2011  . Seasonal allergic rhinitis   . Subclinical hypothyroidism   . Vertigo, benign positional 09/08/2011   Past Surgical History:  Procedure Laterality Date  . CARDIOVASCULAR STRESS TEST  02/15/15   Exercise myocard perfusion testing: Normal EF, normal wall motion, normal perfusion.  Poor exercise capacity (4 min).  . COLONOSCOPY  07/2006; 08/25/17   2007: pseudomembranous colitis, o/w normal. (Dr. JArdis Hughs.  08/2017: adenomatous polyp--recall 3-5 yrs  . COSMETIC SURGERY    . DEXA  06/19/09; 01/19/2019   Normal 2010 and 2020.  [name] Kitchen POSTERIOR REPAIR  2012  . TONSILLECTOMY  82  . Transvaginal ultrasound  10/2016   Normal (done for LLQ pain by Dr. Benay Pike).  . TUBAL LIGATION  1983  . vaginal hernia repair  6-12   rectocele repair   Family History  Problem Relation Age of Onset  . Diabetes Mother 16       type 2  . Aneurysm Father        aortic  . Cancer Father        prostate and lung/ smoker  . Pernicious anemia Father   . Hyperlipidemia Sister   . Hypertension Sister   . Diabetes Sister        type 2  . Other Sister        degenerative disc disease  . Thyroid disease  Sister   . Cancer Sister        breast  cancer s/p dbl mastectomy doing well  . Hyperlipidemia Brother   . Allergies Brother   . Diabetes Daughter        type 1  . Breast cancer Maternal Grandmother   . Heart attack Maternal Grandfather   . Heart disease Paternal Grandmother        CHF  . Pernicious anemia Paternal Grandmother   . Kidney disease Paternal Grandfather   . Stroke Paternal Grandfather   . Hypertension Sister   . Hyperlipidemia Sister   . Diabetes Sister        type 2  . Allergies Daughter   . Breast cancer Sister   . Colon cancer Neg Hx   . Esophageal cancer Neg Hx   . Stomach cancer Neg Hx   . Rectal cancer Neg Hx    Social History   Socioeconomic History  . Marital status: Married    Spouse name: Not on file  . Number of children: Not on file  . Years of education: Not on file  . Highest education level: Not on file  Occupational History  . Not on file  Tobacco Use  . Smoking status: Never Smoker  . Smokeless tobacco: Never Used  . Tobacco comment: smoked for about 6 months  Vaping Use  . Vaping Use: Never used  Substance and Sexual Activity  . Alcohol use: No    Comment: rarely  . Drug use: No  . Sexual activity: Yes    Birth control/protection: Post-menopausal  Other Topics Concern  . Not on file  Social History Narrative   Married.   Nonsmoker.   Social Determinants of Health   Financial Resource Strain: Low Risk   . Difficulty of Paying Living Expenses: Not hard at all  Food Insecurity: No Food Insecurity  . Worried About Charity fundraiser in the Last Year: Never true  . Ran Out of Food in the Last Year: Never true  Transportation Needs: No Transportation Needs  . Lack of Transportation (Medical): No  . Lack of Transportation (Non-Medical): No  Physical Activity: Insufficiently Active  . Days of Exercise per Week: 3 days  . Minutes of Exercise per Session: 30 min  Stress: No Stress Concern Present  . Feeling of Stress : Not at  all  Social Connections: Moderately Integrated  . Frequency of Communication with Friends and Family: More than three times a week  . Frequency of Social Gatherings with Friends and Family: More than three times a week  . Attends Religious Services: More than 4  times per year  . Active Member of Clubs or Organizations: No  . Attends Archivist Meetings: Never  . Marital Status: Married    Tobacco Counseling Counseling given: Not Answered Comment: smoked for about 6 months   Clinical Intake:  Pre-visit preparation completed: Yes  Pain : No/denies pain     Nutritional Status: BMI 25 -29 Overweight Nutritional Risks: None Diabetes: No  How often do you need to have someone help you when you read instructions, pamphlets, or other written materials from your doctor or pharmacy?: 1 - Never  Diabetic?No  Interpreter Needed?: No  Information entered by :: Caroleen Hamman LPN   Activities of Daily Living In your present state of health, do you have any difficulty performing the following activities: 04/17/2021 02/08/2021  Hearing? N N  Vision? N N  Difficulty concentrating or making decisions? N N  Walking or climbing stairs? N N  Dressing or bathing? N N  Doing errands, shopping? N N  Preparing Food and eating ? N -  Using the Toilet? N -  In the past six months, have you accidently leaked urine? Y -  Comment seeing physical therapy -  Do you have problems with loss of bowel control? N -  Managing your Medications? N -  Managing your Finances? N -  Housekeeping or managing your Housekeeping? N -  Some recent data might be hidden    Patient Care Team: Tammi Sou, MD as PCP - General (Family Medicine) Delsa Bern, MD as Consulting Physician (Obstetrics and Gynecology) Milus Banister, MD as Attending Physician (Gastroenterology) Lavonna Monarch, MD as Consulting Physician (Dermatology)  Indicate any recent Medical Services you may have received from  other than [facility] providers in the past year (date may be approximate).     Assessment:   This is a routine wellness examination for [facility].  Hearing/Vision screen  Hearing Screening   '125Hz'  '250Hz'  '500Hz'  '1000Hz'  '2000Hz'  '3000Hz'  '4000Hz'  '6000Hz'  '8000Hz'   Right ear:           Left ear:           Comments: No issues  Vision Screening Comments: Wears glasses Last eye exam-7 months ago-Dr. Katy Fitch  Dietary issues and exercise activities discussed: Current Exercise Habits: Home exercise routine, Type of exercise: strength training/weights;stretching, Time (Minutes): 30, Frequency (Times/Week): 3, Weekly Exercise (Minutes/Week): 90, Intensity: Mild, Exercise limited by: None identified  Goals    . Patient Stated     Improve urinary leakage with Physical Therapy      Depression Screen PHQ 2/9 Scores 04/17/2021 02/08/2021 03/29/2019 07/01/2018 11/05/2017 06/30/2017 05/28/2017  PHQ - 2 Score 0 1 1 0 0 0 2  PHQ- 9 Score - '10 4 6 6 6 13    ' Fall Risk Fall Risk  04/17/2021 02/08/2021 05/23/2020 03/29/2019 07/01/2018  Falls in the past year? '1 1 1 ' 0 No  Number falls in past yr: 0 0 1 - -  Injury with Fall? 0 0 1 0 -  Follow up Falls prevention discussed Falls evaluation completed Falls evaluation completed Falls evaluation completed -    FALL RISK PREVENTION PERTAINING TO THE HOME:  Any stairs in or around the home? No  Home free of loose throw rugs in walkways, pet beds, electrical cords, etc? Yes  Adequate lighting in your home to reduce risk of falls? Yes   ASSISTIVE DEVICES UTILIZED TO PREVENT FALLS:  Life alert? No  Use of a cane, walker or w/c? No  Grab bars  in the bathroom? Yes  Shower chair or bench in shower? No  Elevated toilet seat or a handicapped toilet? No   TIMED UP AND GO:  Was the test performed? Yes .  Length of time to ambulate 10 feet: 9 sec.   Gait steady and fast without use of assistive device  Cognitive Function:Normal cognitive status assessed by direct observation by this  Nurse Health Advisor. No abnormalities found.          Immunizations Immunization History  Administered Date(s) Administered  . Fluad Quad(high Dose 65+) 10/31/2019, 11/06/2020  . Hepatitis A 03/17/2008  . Hepatitis B 01/10/2001, 11/01/2003, 12/01/2003, 04/23/2004  . IPV 03/17/2008  . Influenza Split 09/05/2011, 08/27/2012  . Influenza, High Dose Seasonal PF 09/23/2017, 09/16/2018  . Influenza,inj,Quad PF,6+ Mos 08/30/2013, 11/06/2014, 10/11/2015, 09/12/2016  . Influenza-Unspecified 11/06/2020  . MMR 05/22/2008  . Moderna Sars-Covid-2 Vaccination 01/10/2020, 02/07/2020, 11/07/2020  . Pneumococcal Conjugate-13 06/30/2017  . Pneumococcal Polysaccharide-23 07/01/2018  . Tdap 12/22/2005, 06/02/2008, 06/26/2016  . Typhoid Live 03/17/2008  . Yellow Fever 03/17/2008    TDAP status: Up to date  Flu Vaccine status: Up to date  Pneumococcal vaccine status: Up to date  Covid-19 vaccine status: Completed vaccines  Qualifies for Shingles Vaccine? Yes   Zostavax completed No   Shingrix Completed?: No.    Education has been provided regarding the importance of this vaccine. Patient has been advised to call insurance company to determine out of pocket expense if they have not yet received this vaccine. Advised may also receive vaccine at local pharmacy or Health Dept. Verbalized acceptance and understanding.  Screening Tests Health Maintenance  Topic Date Due  . DEXA SCAN  01/19/2021  . MAMMOGRAM  02/27/2021  . INFLUENZA VACCINE  07/22/2021  . TETANUS/TDAP  06/26/2026  . COLONOSCOPY (Pts 45-19yr Insurance coverage will need to be confirmed)  08/26/2027  . COVID-19 Vaccine  Completed  . Hepatitis C Screening  Completed  . PNA vac Low Risk Adult  Completed  . HPV VACCINES  Aged Out    Health Maintenance  Health Maintenance Due  Topic Date Due  . DEXA SCAN  01/19/2021  . MAMMOGRAM  02/27/2021    Colorectal cancer screening: Type of screening: Colonoscopy. Completed 08/25/2017.  Repeat every 10 years  Mammogram status: Scheduled for 05/17/2021  Bone Density status: Due-Declined today. Patient states she will have it done next year  Lung Cancer Screening: (Low Dose CT Chest recommended if Age 70-80years, 30 pack-year currently smoking OR have quit w/in 15years.) does not qualify.    Additional Screening:  Hepatitis C Screening:  Completed 07/04/2013  Vision Screening: Recommended annual ophthalmology exams for early detection of glaucoma and other disorders of the eye. Is the patient up to date with their annual eye exam?  Yes  Who is the provider or what is the name of the office in which the patient attends annual eye exams? Dr. GKaty Fitch  Dental Screening: Recommended annual dental exams for proper oral hygiene  Community Resource Referral / Chronic Care Management: CRR required this visit?  No   CCM required this visit?  No      Plan:     I have personally reviewed and noted the following in the patient's chart:   . Medical and social history . Use of alcohol, tobacco or illicit drugs  . Current medications and supplements . Functional ability and status . Nutritional status . Physical activity . Advanced directives . List of other physicians . Hospitalizations, surgeries,  and ER visits in previous 12 months . Vitals . Screenings to include cognitive, depression, and falls . Referrals and appointments  In addition, I have reviewed and discussed with patient certain preventive protocols, quality metrics, and best practice recommendations. A written personalized care plan for preventive services as well as general preventive health recommendations were provided to patient.   Patient would like to access avs on mychart.  Marta Antu, LPN   2/83/1517  Nurse Myrle Sheng Advisor  Nurse Notes: none

## 2021-04-24 ENCOUNTER — Ambulatory Visit: Payer: Medicare PPO | Admitting: Dermatology

## 2021-04-29 DIAGNOSIS — M6281 Muscle weakness (generalized): Secondary | ICD-10-CM | POA: Diagnosis not present

## 2021-04-29 DIAGNOSIS — N393 Stress incontinence (female) (male): Secondary | ICD-10-CM | POA: Diagnosis not present

## 2021-04-29 DIAGNOSIS — R3915 Urgency of urination: Secondary | ICD-10-CM | POA: Diagnosis not present

## 2021-04-29 DIAGNOSIS — R159 Full incontinence of feces: Secondary | ICD-10-CM | POA: Diagnosis not present

## 2021-04-29 DIAGNOSIS — N3941 Urge incontinence: Secondary | ICD-10-CM | POA: Diagnosis not present

## 2021-05-17 ENCOUNTER — Other Ambulatory Visit: Payer: Self-pay

## 2021-05-17 ENCOUNTER — Emergency Department (HOSPITAL_COMMUNITY): Payer: Medicare PPO

## 2021-05-17 ENCOUNTER — Encounter (HOSPITAL_COMMUNITY): Payer: Self-pay

## 2021-05-17 ENCOUNTER — Ambulatory Visit: Payer: Medicare PPO

## 2021-05-17 ENCOUNTER — Emergency Department (HOSPITAL_COMMUNITY)
Admission: EM | Admit: 2021-05-17 | Discharge: 2021-05-17 | Disposition: A | Discharge status: Home / Self Care | Payer: Medicare PPO | Attending: Emergency Medicine | Admitting: Emergency Medicine

## 2021-05-17 ENCOUNTER — Telehealth: Payer: Self-pay | Admitting: Family Medicine

## 2021-05-17 DIAGNOSIS — R0789 Other chest pain: Secondary | ICD-10-CM | POA: Diagnosis not present

## 2021-05-17 DIAGNOSIS — Z7982 Long term (current) use of aspirin: Secondary | ICD-10-CM | POA: Diagnosis not present

## 2021-05-17 DIAGNOSIS — R079 Chest pain, unspecified: Secondary | ICD-10-CM

## 2021-05-17 DIAGNOSIS — I959 Hypotension, unspecified: Secondary | ICD-10-CM | POA: Diagnosis not present

## 2021-05-17 DIAGNOSIS — E039 Hypothyroidism, unspecified: Secondary | ICD-10-CM | POA: Diagnosis not present

## 2021-05-17 LAB — CBC WITH DIFFERENTIAL/PLATELET
Abs Immature Granulocytes: 0.02 10*3/uL (ref 0.00–0.07)
Basophils Absolute: 0.1 10*3/uL (ref 0.0–0.1)
Basophils Relative: 1 %
Eosinophils Absolute: 0.1 10*3/uL (ref 0.0–0.5)
Eosinophils Relative: 1 %
HCT: 44.3 % (ref 36.0–46.0)
Hemoglobin: 15.1 g/dL — ABNORMAL HIGH (ref 12.0–15.0)
Immature Granulocytes: 0 %
Lymphocytes Relative: 39 %
Lymphs Abs: 2.5 10*3/uL (ref 0.7–4.0)
MCH: 31.8 pg (ref 26.0–34.0)
MCHC: 34.1 g/dL (ref 30.0–36.0)
MCV: 93.3 fL (ref 80.0–100.0)
Monocytes Absolute: 0.5 10*3/uL (ref 0.1–1.0)
Monocytes Relative: 7 %
Neutro Abs: 3.3 10*3/uL (ref 1.7–7.7)
Neutrophils Relative %: 52 %
Platelets: 166 10*3/uL (ref 150–400)
RBC: 4.75 MIL/uL (ref 3.87–5.11)
RDW: 12.5 % (ref 11.5–15.5)
WBC: 6.3 10*3/uL (ref 4.0–10.5)
nRBC: 0 % (ref 0.0–0.2)

## 2021-05-17 LAB — BASIC METABOLIC PANEL
Anion gap: 7 (ref 5–15)
BUN: 13 mg/dL (ref 8–23)
CO2: 26 mmol/L (ref 22–32)
Calcium: 9.4 mg/dL (ref 8.9–10.3)
Chloride: 107 mmol/L (ref 98–111)
Creatinine, Ser: 0.74 mg/dL (ref 0.44–1.00)
GFR, Estimated: >60 mL/min (ref >60)
Glucose, Bld: 99 mg/dL (ref 70–99)
Potassium: 4.1 mmol/L (ref 3.5–5.1)
Sodium: 140 mmol/L (ref 135–145)

## 2021-05-17 LAB — TROPONIN I (HIGH SENSITIVITY): Troponin I (High Sensitivity): 3 ng/L (ref <18)

## 2021-05-17 MED ORDER — SODIUM CHLORIDE 0.9 % IV BOLUS: 30.0000 mL/kg | Freq: Once | INTRAVENOUS | Status: DC

## 2021-05-17 NOTE — ED Provider Notes (Signed)
Patient continues [facility] DEPT Provider Note   CSN: [id number] Arrival date & time: 05/17/21  1227     History Chief Complaint  Patient presents with  . Hypotension    Barbara Munoz is a 70 y.o. female.  Patient presents with chief complaint of low blood pressure.  She states she woke up this morning and felt a little "off," and she decided check her blood pressure and saw that it was low.  She otherwise had no complaints of headache or chest pain or abdominal pain.  She called her primary care doctor who advised her to go to the ER.  She denies fevers or cough or discomfort at this time.  However she states that yesterday she had some chest pain in the mid chest that lasted about 5 minutes when she woke up and that has since not bothered her again.        Past Medical History:  Diagnosis Date  . Anxiety and depression 09/08/2011  . Female bladder prolapse   . GERD (gastroesophageal reflux disease) 09/05/2011   Improved signif with PPI  . History of adenomatous polyp of colon 08/2017   Recall 3-5 yrs  . History of Clostridium difficile infection 2007   colonic changes c/w with this dx seen on colonoscopy 2007---sx's responded to flagyl.  [name] Kitchen History of cyst of breast    multiple drained during menopause  . Hyperlipidemia   . IFG (impaired fasting glucose) 06/2018   Fastings 105-115 range when pt checked with husband's glucometer.  Fasting gluc here was 98 and A1c 5.6% 07/01/18.  . Insomnia 09/05/2011  . Migraine    worst during menopause  . Overweight (BMI 25.0-29.9) 09/05/2011  . Seasonal allergic rhinitis   . Subclinical hypothyroidism   . Vertigo, benign positional 09/08/2011    Patient Active Problem List   Diagnosis Date Noted  . Breast lump 11/07/2019  . Maxillary sinusitis 08/08/2015  . 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 Health maintenance examination 11/03/2013  . Preventative health care 11/09/2012  . Anxiety and depression  09/08/2011  . GERD (gastroesophageal reflux disease) 09/05/2011  . Overweight (BMI 25.0-29.9) 09/05/2011  . Insomnia 09/05/2011  . Hyperlipidemia   . Migraine     Past Surgical History:  Procedure Laterality Date  . CARDIOVASCULAR STRESS TEST  02/15/15   Exercise myocard perfusion testing: Normal EF, normal wall motion, normal perfusion.  Poor exercise capacity (4 min).  . COLONOSCOPY  07/2006; 08/25/17   2007: pseudomembranous colitis, o/w normal. (Dr. Ardis Hughs).  08/2017: adenomatous polyp--recall 3-5 yrs  . COSMETIC SURGERY    . DEXA  06/19/09; 01/19/2019   Normal 2010 and 2020.  [name] Kitchen POSTERIOR REPAIR  2012  . TONSILLECTOMY  82  . Transvaginal ultrasound  10/2016   Normal (done for LLQ pain by Dr. Benay Pike).  . TUBAL LIGATION  1983  . vaginal hernia repair  6-12   rectocele repair     OB History    Gravida  3   Para  2   Term      Preterm      AB      Living        SAB      IAB      Ectopic      Multiple      Live Births              Family History  Problem Relation Age of Onset  . Diabetes Mother 57  type 2  . Aneurysm Father        aortic  . Cancer Father        prostate and lung/ smoker  . Pernicious anemia Father   . Hyperlipidemia Sister   . Hypertension Sister   . Diabetes Sister        type 2  . Other Sister        degenerative disc disease  . Thyroid disease Sister   . Cancer Sister        breast  cancer s/p dbl mastectomy doing well  . Hyperlipidemia Brother   . Allergies Brother   . Diabetes Daughter        type 1  . Breast cancer Maternal Grandmother   . Heart attack Maternal Grandfather   . Heart disease Paternal Grandmother        CHF  . Pernicious anemia Paternal Grandmother   . Kidney disease Paternal Grandfather   . Stroke Paternal Grandfather   . Hypertension Sister   . Hyperlipidemia Sister   . Diabetes Sister        type 2  . Allergies Daughter   . Breast cancer Sister   . Colon cancer Neg Hx   . Esophageal  cancer Neg Hx   . Stomach cancer Neg Hx   . Rectal cancer Neg Hx     Social History   Tobacco Use  . Smoking status: Never Smoker  . Smokeless tobacco: Never Used  . Tobacco comment: smoked for about 6 months  Vaping Use  . Vaping Use: Never used  Substance Use Topics  . Alcohol use: Yes    Comment: moderate use  . Drug use: No    Home Medications Prior to Admission medications   Medication Sig Start Date End Date Taking? Authorizing Provider  ALPRAZolam (XANAX) 0.25 MG tablet Take 1 tablet (0.25 mg total) by mouth 2 (two) times daily as needed. 02/08/21   McGowen, Adrian Blackwater, MD  aspirin 81 MG tablet Take 81 mg by mouth daily.    [provider]  B Complex Vitamins (VITAMIN B-COMPLEX) TABS Takes 1/2 tablet daily    [provider]  Calcium Carbonate-Vitamin D 600-400 MG-UNIT tablet Take 1 tablet by mouth daily.    [provider]  Ferrous Sulfate (IRON) 325 (65 Fe) MG TABS Take by mouth daily.     [provider]  loratadine (CLARITIN) 10 MG tablet Take 10 mg by mouth daily as needed. Spring and fall    [provider]  Magnesium 250 MG TABS Take 250 mg by mouth daily.    [provider]  Melatonin 10 MG TABS Take 5 mg by mouth.    [provider]  meloxicam (MOBIC) 7.5 MG tablet TAKE 1 TO 2 TABLETS EVERY DAY AS NEEDED FOR MUSCULOSKELETAL PAIN 06/08/20   McGowen, Adrian Blackwater, MD  Multiple Vitamin (MULTIVITAMIN) tablet Take 1 tablet by mouth daily.    [provider]  Omega-3 Fatty Acids (FISH OIL PO) Take by mouth daily.    [provider]  pantoprazole (PROTONIX) 40 MG tablet Take 1 tablet (40 mg total) by mouth daily as needed. 08/30/18   Tammi Sou, MD  sertraline (ZOLOFT) 100 MG tablet 2 tabs po qhs 02/08/21   McGowen, Adrian Blackwater, MD  simvastatin (ZOCOR) 40 MG tablet TAKE 1 TABLET BY MOUTH DAILY AT 6 PM. 03/11/21   McGowen, Adrian Blackwater, MD    Allergies    Patient has no known allergies.  Review of  Systems   Review of Systems  Constitutional: Negative for fever.  HENT: Negative for ear pain.   Eyes: Negative for pain.  Respiratory: Negative for cough.   Cardiovascular: Positive for chest pain.  Gastrointestinal: Negative for abdominal pain.  Genitourinary: Negative for flank pain.  Musculoskeletal: Negative for back pain.  Skin: Negative for rash.  Neurological: Negative for headaches.    Physical Exam Updated Vital Signs BP 108/75   Pulse 72   Temp 98.1 F (36.7 C) (Oral)   Resp 18   Ht 5\' 3"  (1.6 m)   Wt 72.6 kg   SpO2 95%   BMI 28.34 kg/m   Physical Exam Constitutional:      General: She is not in acute distress.    Appearance: Normal appearance.  HENT:     Head: Normocephalic.     Nose: Nose normal.  Eyes:     Extraocular Movements: Extraocular movements intact.  Cardiovascular:     Rate and Rhythm: Normal rate.  Pulmonary:     Effort: Pulmonary effort is normal.  Musculoskeletal:        General: Normal range of motion.     Cervical back: Normal range of motion.     Comments: Mid chest wall tenderness on palpation reproduces her pain.  Neurological:     General: No focal deficit present.     Mental Status: She is alert. Mental status is at baseline.     ED Results / Procedures / Treatments   Labs (all labs ordered are listed, but only abnormal results are displayed) Labs Reviewed  CBC WITH DIFFERENTIAL/PLATELET - Abnormal; Notable for the following components:      Result Value   Hemoglobin 15.1 (*)    All other components within normal limits  BASIC METABOLIC PANEL  TROPONIN I (HIGH SENSITIVITY)    EKG EKG Interpretation  Date/Time:  Friday May 17 2021 13:32:37 EDT Ventricular Rate:  67 PR Interval:  137 QRS Duration: 98 QT Interval:  413 QTC Calculation: 436 R Axis:   56 Text Interpretation: Sinus rhythm Low voltage, precordial leads Confirmed by Thamas Jaegers ([id number]) on 05/17/2021 2:25:02 PM   Radiology DG Chest Port 1  View  Result Date: 05/17/2021 CLINICAL DATA:  Chest pain EXAM: PORTABLE CHEST 1 VIEW COMPARISON:  None. FINDINGS: The heart size and mediastinal contours are within normal limits. Both lungs are clear. The visualized skeletal structures are unremarkable. IMPRESSION: No acute abnormality of the lungs in AP portable projection. Electronically Signed   By: Eddie Candle M.D.   On: 05/17/2021 14:20    Procedures Procedures   Medications Ordered in ED Medications - No data to display  ED Course  I have reviewed the triage vital signs and the nursing notes.  Pertinent labs & imaging results that were available during my care of the patient were reviewed by me and considered in my medical decision making (see chart for details).    MDM Rules/Calculators/A&P                          Vital signs remained stable here in the ER.  Hemoglobin of 15 white count is 6 chemistry unremarkable troponin is negative.  Have no symptoms of pain or discomfort at this time.  Etiology of her initial hypotensive episode at home is unclear.  Recommending close follow-up with her doctor in 2 or 3 days.  Recommending immediate return for worsening pain fevers or any additional concerns.  Final Clinical Impression(s) / ED Diagnoses Final diagnoses:  Chest pain, unspecified type  Hypotensive episode    Rx / DC Orders ED Discharge Orders    None       Luna Fuse, MD 05/17/21 [phone]

## 2021-05-17 NOTE — Telephone Encounter (Signed)
Pt called stating chest pain and exhaustion yesterday. BP yesterday AM 83/64 & PM 103/69. Today less chest pain, still exhausted, and BP this morning 88/64. Transferred to [facility] with [facility].

## 2021-05-17 NOTE — Telephone Encounter (Signed)
Pt called to notify she is on her way to [facility] ER

## 2021-05-17 NOTE — ED Triage Notes (Signed)
Patient states she took her BP in the left arm this AM and it was 83/68 at home. Patient states her BP has been low x 2 days. Patient states she has been taking her "BP at home when she feels weird." Patient states she had CP yesterday that lasted approx 10-15 minutes, but none today.  BP in triage-121-67

## 2021-05-17 NOTE — Discharge Instructions (Addendum)
Call your primary care doctor or specialist as discussed in the next 2-3 days.   Return immediately back to the ER if:  Your symptoms worsen within the next 12-24 hours. You develop new symptoms such as new fevers, persistent vomiting, new pain, shortness of breath, or new weakness or numbness, or if you have any other concerns.  

## 2021-05-21 ENCOUNTER — Ambulatory Visit
Admission: RE | Admit: 2021-05-21 | Discharge: 2021-05-21 | Disposition: A | Discharge status: Home / Self Care | Payer: Medicare PPO | Source: Ambulatory Visit | Attending: Obstetrics and Gynecology | Admitting: Obstetrics and Gynecology

## 2021-05-21 ENCOUNTER — Other Ambulatory Visit: Payer: Self-pay

## 2021-05-21 DIAGNOSIS — Z1231 Encounter for screening mammogram for malignant neoplasm of breast: Secondary | ICD-10-CM | POA: Diagnosis not present

## 2021-05-21 NOTE — Telephone Encounter (Signed)
Pt seen in ED   [facility] Patient Name: Barbara Munoz Gender: Female DOB: 18-Mar-1951 Age: 70 Y 75 M Return Phone Number: [phone] (Primary), [phone] (Secondary) Address: City/ State/ Zip: [place] [address] Client [facility] - Client Client Site [facility] - Day Physician Crissie Sickles - MD Contact Type Call Who Is Calling Patient / Member / Family / Caregiver Call Type Triage / Clinical Relationship To Patient Self Return Phone Number [phone] (Primary) Chief Complaint BLOOD PRESSURE LOW - Systolic (top number) 90 or less Reason for Call Symptomatic / Request for Health Information Initial Comment Caller states she has Sx of BP of 88/64 with extreme exhaustion. Office has no appointments. Translation No Nurse Assessment Nurse: Zorita Pang, RN, Deborah Date/Time (Eastern Time): 05/17/2021 10:37:18 AM Confirm and document reason for call. If symptomatic, describe symptoms. ---Caller states that her BP of 88/64 and it usually runs 100-110/70-80. States that she has type A personality and May has been very active. She has been taking care of her 2 grandchildren to school, baseball practice for 4 days. She states that she does not seem to be able to recovery from the 4 days of activity. Eating but not well. Has been doing a lot of reading. Does the patient have any new or worsening symptoms? ---Yes Will a triage be completed? ---Yes Related visit to physician within the last 2 weeks? ---No Does the PT have any chronic conditions? (i.e. diabetes, asthma, this includes High risk factors for pregnancy, etc.) ---Yes List chronic conditions. ---depression takes Sertraline 200 mg, anxiety since in her 77s, hyperlipidemia simvastatin 40 mg Is this a behavioral health or substance abuse call? ---No Guidelines Guideline Title Affirmed Question  Affirmed Notes Nurse Date/Time (Eastern Time) Weakness (Generalized) and Fatigue [1] MODERATE weakness (i.e., interferes with work, school, normal activities) [place], RN, Neoma Laming 05/17/2021 10:42:37 AM PLEASE NOTE: All timestamps contained within this report are represented as [place] Standard Time. CONFIDENTIALTY NOTICE: This fax transmission is intended only for the addressee. It contains information that is legally privileged, confidential or otherwise protected from use or disclosure. If you are not the intended recipient, you are strictly prohibited from reviewing, disclosing, copying using or disseminating any of this information or taking any action in reliance on or regarding this information. If you have received this fax in error, please notify us immediately by telephone so that we can arrange for its return to [place]. Phone: [phone], Toll-Free: [phone], Fax: [phone] Page: 2 of 2 Call Id: [id number] Guidelines Guideline Title Affirmed Question Affirmed Notes Nurse Date/Time Eilene Ghazi Time) [2] cause unknown (Exceptions: weakness with acute minor illness, or weakness from poor fluid intake) Disp. Time Eilene Ghazi Time) Disposition Final User 05/17/2021 10:35:40 AM Send to Urgent Mellody Dance 05/17/2021 10:57:36 AM See HCP within 4 Hours (or PCP triage) Yes Zorita Pang, RN, Garrel Ridgel Disagree/Comply Comply Caller Understands Yes PreDisposition Go to ED Care Advice Given Per Guideline SEE HCP (OR PCP TRIAGE) WITHIN 4 HOURS: * IF OFFICE WILL BE CLOSED AND NO PCP (PRIMARY CARE PROVIDER) SECOND-LEVEL TRIAGE: You need to be seen within the next 3 or 4 hours. A nearby Urgent Care Center [facility]) is often a good source of care. Another choice is to go to the ED. Go sooner if you become worse. * You become worse Comments User: Marquis Buggy, RN Date/Time Eilene Ghazi Time): 05/17/2021 10:57:30 AM This nurse stressed that women with  these symptoms are different  than men and that these symptoms of weakness, chest pain yesterday could be related to her heart and that she needs to go to the ED to be evaluated. She states that she may or may not go to the ED. This nurse stressed that she needs to go but she never indicated that she would. User: Marquis Buggy, RN Date/Time Eilene Ghazi Time): 05/17/2021 10:58:11 AM This nurse did call the office but no availability in the time frame of the triage. Referrals GO TO FACILITY UNDECIDED

## 2021-05-24 ENCOUNTER — Encounter: Payer: Self-pay | Admitting: Family Medicine

## 2021-05-24 ENCOUNTER — Ambulatory Visit: Payer: Medicare PPO | Admitting: Family Medicine

## 2021-05-24 ENCOUNTER — Other Ambulatory Visit: Payer: Self-pay

## 2021-05-24 VITALS — BP 106/72 | HR 74 | Temp 97.5°F | Wt 154.2 lb

## 2021-05-24 DIAGNOSIS — I959 Hypotension, unspecified: Secondary | ICD-10-CM | POA: Diagnosis not present

## 2021-05-24 DIAGNOSIS — R0789 Other chest pain: Secondary | ICD-10-CM | POA: Diagnosis not present

## 2021-05-24 DIAGNOSIS — R42 Dizziness and giddiness: Secondary | ICD-10-CM

## 2021-05-24 NOTE — Progress Notes (Signed)
OFFICE VISIT  05/24/2021  CC:  Chief Complaint  Patient presents with  . ER Follow Up    **not fasting: ate breakfast**   HPI:    Patient is a 70 y.o. Caucasian female who presents for f/u ED visit at [facility] 05/17/21.  She presented there for a CC of low blood pressure.  Had woken up that morning feeling "off" and decided to check her bp and it was "low".  She had had a brief period of nonexertional CP in mid chest after waking up the day prior, w/out recurrence.  I reviewed this entire ED encounter today, including notes, ekg, cxr, and labs.   Portable CXR normal, ekg with low voltage precordial leads and otherwise normal, cbc and bmet normal, troponin neg. BP in ED was 108/75, P 72.  Notes state she had "mid chest wall tenderness to palpation that reproduces her pain". Pt was improved in ED and no etiology for her low blood pressure was found.    Interim hx: HPI was recounted today: woke up 1 week ago and felt a burning pain in central chest, lasted 10-15 min.  Has had several light very brief twinges of similar pain since. Didn't feel "good" at that time but denies palpitations or dizziness or presyncope.  Slept more that day and started checking her bp that afternoon and noted 80s/60s and this continued through the next morning so she called here and our nurse triaged her to the ED. ED visit summarized above. Since the ED visit her daily bp has still been 08-657, systolics consistently 84O.  HR 70s. About 2-3 d/a started to feel ok again when she gets out of bed. Avg daily intake of fluids is 50 oz, up to 60 oz/day when outside more. Admits some days lately hasn't hydrated well.  Denies orthostatic dizziness.  No urinary complaints.  She is starting PT for pelvic floor/incontinence.  Today she felt good when she got up, has rushed around since then and feels a bit "up" inside. She doesn't feel like anxiety or poor mood has triggered any of this.  Looking back at BP's in EMR from  last couple years her avg is approx 115/65, with 5 measurements in "soft" category-->95/65, 104/68, 104/72, 102/60, 104/64.  No elevated bp's.   ROS as above, plus--> no fevers, no wheezing, no cough, no HAs, no rashes, no melena/hematochezia.  No polyuria or polydipsia.  No myalgias or arthralgias.  No focal weakness, paresthesias, or tremors.  No acute vision or hearing abnormalities.  No dysuria or unusual/new urinary urgency or frequency.  No recent changes in lower legs. No n/v/d or abd pain.    Past Medical History:  Diagnosis Date  . Anxiety and depression 09/08/2011  . Female bladder prolapse   . GERD (gastroesophageal reflux disease) 09/05/2011   Improved signif with PPI  . History of adenomatous polyp of colon 08/2017   Recall 3-5 yrs  . History of Clostridium difficile infection 2007   colonic changes c/w with this dx seen on colonoscopy 2007---sx's responded to flagyl.  [name] Kitchen History of cyst of breast    multiple drained during menopause  . Hyperlipidemia   . IFG (impaired fasting glucose) 06/2018   Fastings 105-115 range when pt checked with husband's glucometer.  Fasting gluc here was 98 and A1c 5.6% 07/01/18.  . Insomnia 09/05/2011  . Migraine    worst during menopause  . Overweight (BMI 25.0-29.9) 09/05/2011  . Seasonal allergic rhinitis   . Subclinical hypothyroidism   .  Vertigo, benign positional 09/08/2011    Past Surgical History:  Procedure Laterality Date  . CARDIOVASCULAR STRESS TEST  02/15/15   Exercise myocard perfusion testing: Normal EF, normal wall motion, normal perfusion.  Poor exercise capacity (4 min).  . COLONOSCOPY  07/2006; 08/25/17   2007: pseudomembranous colitis, o/w normal. (Dr. Ardis Hughs).  08/2017: adenomatous polyp--recall 3-5 yrs  . COSMETIC SURGERY    . DEXA  06/19/09; 01/19/2019   Normal 2010 and 2020.  [name] Kitchen POSTERIOR REPAIR  2012  . TONSILLECTOMY  82  . Transvaginal ultrasound  10/2016   Normal (done for LLQ pain by Dr. Benay Pike).  . TUBAL  LIGATION  1983  . vaginal hernia repair  6-12   rectocele repair    Outpatient Medications Prior to Visit  Medication Sig Dispense Refill  . ALPRAZolam (XANAX) 0.25 MG tablet Take 1 tablet (0.25 mg total) by mouth 2 (two) times daily as needed. 90 tablet 1  . aspirin 81 MG tablet Take 81 mg by mouth daily.    . B Complex Vitamins (VITAMIN B-COMPLEX) TABS Takes 1/2 tablet daily    . Calcium Carbonate-Vitamin D 600-400 MG-UNIT tablet Take 1 tablet by mouth daily.    . Ferrous Sulfate (IRON) 325 (65 Fe) MG TABS Take by mouth daily.     [name] Kitchen loratadine (CLARITIN) 10 MG tablet Take 10 mg by mouth daily as needed. Spring and fall    . Magnesium 250 MG TABS Take 250 mg by mouth daily.    . Melatonin 10 MG TABS Take 5 mg by mouth.    . meloxicam (MOBIC) 7.5 MG tablet TAKE 1 TO 2 TABLETS EVERY DAY AS NEEDED FOR MUSCULOSKELETAL PAIN 60 tablet 5  . Multiple Vitamin (MULTIVITAMIN) tablet Take 1 tablet by mouth daily.    . Omega-3 Fatty Acids (FISH OIL PO) Take by mouth daily.    . pantoprazole (PROTONIX) 40 MG tablet Take 1 tablet (40 mg total) by mouth daily as needed. 30 tablet 5  . sertraline (ZOLOFT) 100 MG tablet 2 tabs po qhs 60 tablet 11  . simvastatin (ZOCOR) 40 MG tablet TAKE 1 TABLET BY MOUTH DAILY AT 6 PM. 90 tablet 1   No facility-administered medications prior to visit.    No Known Allergies  ROS As per HPI  PE: Vitals with BMI 05/24/2021 05/17/2021 05/17/2021  Height - - -  Weight 154 lbs 4 oz - -  BMI 01.75 - -  Systolic 102 585 277  Diastolic 72 66 75  Pulse 74 68 72   Orthostatics today:  Supine 121/81, P 77, sitting 114/65, P 75, standing 102/69, P 84 1 minute between each check.  Mild dizziness upon going from supine to sitting upright.  Gen: Alert, well appearing.  Patient is oriented to person, place, time, and situation. AFFECT: pleasant, lucid thought and speech. OEU:MPNT: no injection, icteris, swelling, or exudate.  EOMI, PERRLA. Mouth: lips without lesion/swelling.   Oral mucosa pink and moist. Oropharynx without erythema, exudate, or swelling.  Neck - No masses or thyromegaly or limitation in range of motion. No bruits. CV: RRR, no m/r/g.   LUNGS: CTA bilat, nonlabored resps, good aeration in all lung fields. EXT: no clubbing or cyanosis.  no edema.  Neuro: CN 2-12 intact bilaterally, strength 5/5 in proximal and distal upper extremities and lower extremities bilaterally.  No sensory deficits.  No tremor.No ataxia.  No pronator drift.  LABS:  Lab Results  Component Value Date   TSH 3.57 02/08/2021   Lab  Results  Component Value Date   WBC 6.3 05/17/2021   HGB 15.1 (H) 05/17/2021   HCT 44.3 05/17/2021   MCV 93.3 05/17/2021   PLT 166 05/17/2021   Lab Results  Component Value Date   CREATININE 0.74 05/17/2021   BUN 13 05/17/2021   NA 140 05/17/2021   K 4.1 05/17/2021   CL 107 05/17/2021   CO2 26 05/17/2021   Lab Results  Component Value Date   ALT 35 02/08/2021   AST 28 02/08/2021   ALKPHOS 53 02/08/2021   BILITOT 0.5 02/08/2021   Lab Results  Component Value Date   CHOL 205 (H) 02/08/2021   Lab Results  Component Value Date   HDL 48.90 02/08/2021   Lab Results  Component Value Date   LDLCALC 118 (H) 02/08/2021   Lab Results  Component Value Date   TRIG 191.0 (H) 02/08/2021   Lab Results  Component Value Date   CHOLHDL 4 02/08/2021   Lab Results  Component Value Date   HGBA1C 5.4 11/08/2019    IMPRESSION AND PLAN:  1) Atypical CP--resolved.  ED eval 05/17/21 reassuring. No worrisome features to suggest need for ischemic w/u.  2) Symptomatic hypotension--in the setting of known chronic low normal to low bp at baseline.  Mild orthostatic dizziness today but bp not low and not a signif exaggerated HR response. Overall everything is pretty reassuring, although we don't have an answer as to why she has just started feeling bad and having lower bp.  Shared decision-making process utilized today, discussed possible  referral to cardiologist vs trial of florinef vs just obs. She chose obs at this time. In the meantime, increase fluid intake to 65 oz/day, continue to monitor bp and hr periodically.  An After Visit Summary was printed and given to the patient.  FOLLOW UP: Return in about 2 weeks (around 06/07/2021) for symptomatic low bp.  Signed:  Crissie Sickles, MD           05/24/2021

## 2021-05-27 ENCOUNTER — Encounter: Payer: Self-pay | Admitting: Family Medicine

## 2021-05-27 MED ORDER — FLUDROCORTISONE ACETATE 0.1 MG PO TABS: 0.1000 mg | ORAL_TABLET | Freq: Every day | ORAL | 0 refills | Status: DC

## 2021-05-27 NOTE — Telephone Encounter (Signed)
Fludrocortisone eRx'd.

## 2021-06-05 DIAGNOSIS — N393 Stress incontinence (female) (male): Secondary | ICD-10-CM | POA: Diagnosis not present

## 2021-06-05 DIAGNOSIS — R159 Full incontinence of feces: Secondary | ICD-10-CM | POA: Diagnosis not present

## 2021-06-05 DIAGNOSIS — R3915 Urgency of urination: Secondary | ICD-10-CM | POA: Diagnosis not present

## 2021-06-05 DIAGNOSIS — M6281 Muscle weakness (generalized): Secondary | ICD-10-CM | POA: Diagnosis not present

## 2021-06-05 DIAGNOSIS — N3941 Urge incontinence: Secondary | ICD-10-CM | POA: Diagnosis not present

## 2021-06-07 ENCOUNTER — Encounter: Payer: Self-pay | Admitting: Family Medicine

## 2021-06-07 ENCOUNTER — Other Ambulatory Visit: Payer: Self-pay

## 2021-06-07 ENCOUNTER — Ambulatory Visit: Payer: Medicare PPO | Admitting: Family Medicine

## 2021-06-07 ENCOUNTER — Other Ambulatory Visit: Payer: Medicare PPO

## 2021-06-07 ENCOUNTER — Telehealth: Payer: Self-pay | Admitting: Family Medicine

## 2021-06-07 VITALS — BP 99/59 | HR 79 | Temp 97.4°F | Resp 16 | Ht 63.0 in | Wt 155.4 lb

## 2021-06-07 DIAGNOSIS — I9589 Other hypotension: Secondary | ICD-10-CM

## 2021-06-07 LAB — BASIC METABOLIC PANEL
BUN: 18 mg/dL (ref 6–23)
CO2: 26 mEq/L (ref 19–32)
Calcium: 9.5 mg/dL (ref 8.4–10.5)
Chloride: 107 mEq/L (ref 96–112)
Creatinine, Ser: 0.73 mg/dL (ref 0.40–1.20)
GFR: 83.83 mL/min (ref >60.00)
Glucose, Bld: 92 mg/dL (ref 70–99)
Potassium: 4.3 mEq/L (ref 3.5–5.1)
Sodium: 141 mEq/L (ref 135–145)

## 2021-06-07 LAB — CORTISOL: Cortisol, Plasma: 6.2 ug/dL

## 2021-06-07 MED ORDER — FLUDROCORTISONE ACETATE 0.1 MG PO TABS: 0.1000 mg | ORAL_TABLET | Freq: Every day | ORAL | 3 refills | Status: DC

## 2021-06-07 NOTE — Addendum Note (Signed)
Addended by: Tammi Sou on: 06/07/2021 04:58 PM   Modules accepted: Orders

## 2021-06-07 NOTE — Progress Notes (Signed)
OFFICE VISIT  06/07/2021  CC:  Chief Complaint  Patient presents with   Follow-up    Low bp; asymptomatic     HPI:    Patient is a 70 y.o. Caucasian female who presents for 2 wk f/u symptomatic low blood pressure. A/P as of last visit: "1) Atypical CP--resolved.  ED eval 05/17/21 reassuring. No worrisome features to suggest need for ischemic w/u.   2) Symptomatic hypotension--in the setting of known chronic low normal to low bp at baseline.  Mild orthostatic dizziness today but bp not low and not a signif exaggerated HR response. Overall everything is pretty reassuring, although we don't have an answer as to why she has just started feeling bad and having lower bp.  Shared decision-making process utilized today, discussed possible referral to cardiologist vs trial of florinef vs just obs. She chose obs at this time. In the meantime, increase fluid intake to 65 oz/day, continue to monitor bp and hr periodically."  INTERIM HX: Three days after last visit pt called and stated she was ready to start medication-->florinef 0.1mg  qd rx'd at that time.  She does feel signif improvement and it started about 4-5d after starting florinef--energy level feels normal.  Rarely feeling any lightheadedness and when it does happen it is quite brief. No LE swelling.  Wt is stable.  Home bp's and HR's from last 2 wks reviewed-->more #s in 95G systolic, consistently 38V diast. After starting florinef her systolics are more consistently in upper 90s to low 100s, a couple of diastolics over 70.  HR avg approx 78. Fluid intake avg 55 oz/day.  She tells me today that her sister has a dx of neuroendocrine carcinoma but she doesn't know any details.  Past Medical History:  Diagnosis Date   Anxiety and depression 09/08/2011   Female bladder prolapse    GERD (gastroesophageal reflux disease) 09/05/2011   Improved signif with PPI   History of adenomatous polyp of colon 08/2017   Recall 3-5 yrs   History of  Clostridium difficile infection 2007   colonic changes c/w with this dx seen on colonoscopy 2007---sx's responded to flagyl.   History of cyst of breast    multiple drained during menopause   Hyperlipidemia    IFG (impaired fasting glucose) 06/2018   Fastings 105-115 range when pt checked with husband's glucometer.  Fasting gluc here was 98 and A1c 5.6% 07/01/18.   Insomnia 09/05/2011   Migraine    worst during menopause   Overweight (BMI 25.0-29.9) 09/05/2011   Seasonal allergic rhinitis    Subclinical hypothyroidism    Vertigo, benign positional 09/08/2011    Past Surgical History:  Procedure Laterality Date   CARDIOVASCULAR STRESS TEST  02/15/15   Exercise myocard perfusion testing: Normal EF, normal wall motion, normal perfusion.  Poor exercise capacity (4 min).   COLONOSCOPY  07/2006; 08/25/17   2007: pseudomembranous colitis, o/w normal. (Dr. Ardis Hughs).  08/2017: adenomatous polyp--recall 3-5 yrs   COSMETIC SURGERY     DEXA  06/19/09; 01/19/2019   Normal 2010 and 2020.   POSTERIOR REPAIR  2012   TONSILLECTOMY  82   Transvaginal ultrasound  10/2016   Normal (done for LLQ pain by Dr. Benay Pike).   TUBAL LIGATION  1983   vaginal hernia repair  6-12   rectocele repair    Outpatient Medications Prior to Visit  Medication Sig Dispense Refill   ALPRAZolam (XANAX) 0.25 MG tablet Take 1 tablet (0.25 mg total) by mouth 2 (two) times daily as needed.  90 tablet 1   aspirin 81 MG tablet Take 81 mg by mouth daily.     B Complex Vitamins (VITAMIN B-COMPLEX) TABS Takes 1/2 tablet daily     Ferrous Sulfate (IRON) 325 (65 Fe) MG TABS Take by mouth daily.      loratadine (CLARITIN) 10 MG tablet Take 10 mg by mouth daily as needed. Spring and fall     Magnesium 250 MG TABS Take 250 mg by mouth daily.     Melatonin 10 MG TABS Take 5 mg by mouth.     meloxicam (MOBIC) 7.5 MG tablet TAKE 1 TO 2 TABLETS EVERY DAY AS NEEDED FOR MUSCULOSKELETAL PAIN 60 tablet 5   Multiple Vitamin (MULTIVITAMIN) tablet  Take 1 tablet by mouth daily.     Omega-3 Fatty Acids (FISH OIL PO) Take by mouth daily.     pantoprazole (PROTONIX) 40 MG tablet Take 1 tablet (40 mg total) by mouth daily as needed. 30 tablet 5   sertraline (ZOLOFT) 100 MG tablet 2 tabs po qhs 60 tablet 11   simvastatin (ZOCOR) 40 MG tablet TAKE 1 TABLET BY MOUTH DAILY AT 6 PM. 90 tablet 1   fludrocortisone (FLORINEF) 0.1 MG tablet Take 1 tablet (0.1 mg total) by mouth daily. 30 tablet 0   Calcium Carbonate-Vitamin D 600-400 MG-UNIT tablet Take 1 tablet by mouth daily. (Patient not taking: Reported on 06/07/2021)     No facility-administered medications prior to visit.    No Known Allergies  ROS As per HPI  PE: Vitals with BMI 06/07/2021 05/24/2021 05/17/2021  Height 5\' 3"  - -  Weight 155 lbs 6 oz 154 lbs 4 oz -  BMI 60.10 93.23 -  Systolic 99 557 322  Diastolic 59 72 66  Pulse 79 74 68     Gen: Alert, well appearing.  Patient is oriented to person, place, time, and situation. AFFECT: pleasant, lucid thought and speech. CV: RRR, no m/r/g.   LUNGS: CTA bilat, nonlabored resps, good aeration in all lung fields. EXT: no clubbing or cyanosis.  no edema.    LABS:    Chemistry      Component Value Date/Time   NA 140 05/17/2021 1313   K 4.1 05/17/2021 1313   CL 107 05/17/2021 1313   CO2 26 05/17/2021 1313   BUN 13 05/17/2021 1313   CREATININE 0.74 05/17/2021 1313      Component Value Date/Time   CALCIUM 9.4 05/17/2021 1313   ALKPHOS 53 02/08/2021 1037   AST 28 02/08/2021 1037   ALT 35 02/08/2021 1037   BILITOT 0.5 02/08/2021 1037     Lab Results  Component Value Date   WBC 6.3 05/17/2021   HGB 15.1 (H) 05/17/2021   HCT 44.3 05/17/2021   MCV 93.3 05/17/2021   PLT 166 05/17/2021   Lab Results  Component Value Date   TSH 3.57 02/08/2021   Lab Results  Component Value Date   CHOL 205 (H) 02/08/2021   HDL 48.90 02/08/2021   LDLCALC 118 (H) 02/08/2021   LDLDIRECT 110.6 10/26/2013   TRIG 191.0 (H) 02/08/2021    CHOLHDL 4 02/08/2021   IMPRESSION AND PLAN:  Hypotension, unknown etiology. She is feeling signif improvement with florinef 0.1mg  qd and consistent good intake of fluids.   Will check bmet as well as serum cortisol and ACTH levels today.  An After Visit Summary was printed and given to the patient.  FOLLOW UP: Return for keep appt already set for 08/08/21.  Signed:  Abbe Amsterdam  Bennett Vanscyoc, MD           06/07/2021

## 2021-06-12 LAB — EXTRA SPECIMEN

## 2021-06-12 LAB — RENIN

## 2021-06-12 LAB — ALDOSTERONE

## 2021-06-21 LAB — ACTH: C206 ACTH: 28 pg/mL (ref 6–50)

## 2021-06-21 LAB — RENIN: Renin Activity: 0.36 ng/mL/h (ref 0.25–5.82)

## 2021-06-21 LAB — EXTRA SPECIMEN

## 2021-06-21 LAB — ALDOSTERONE: Aldosterone: 3 ng/dL

## 2021-07-03 ENCOUNTER — Ambulatory Visit: Payer: Medicare PPO | Admitting: Dermatology

## 2021-07-03 ENCOUNTER — Encounter: Payer: Self-pay | Admitting: Dermatology

## 2021-07-03 ENCOUNTER — Other Ambulatory Visit: Payer: Self-pay

## 2021-07-03 DIAGNOSIS — L570 Actinic keratosis: Secondary | ICD-10-CM

## 2021-07-03 DIAGNOSIS — Z1283 Encounter for screening for malignant neoplasm of skin: Secondary | ICD-10-CM | POA: Diagnosis not present

## 2021-07-03 DIAGNOSIS — L821 Other seborrheic keratosis: Secondary | ICD-10-CM

## 2021-07-03 DIAGNOSIS — D1801 Hemangioma of skin and subcutaneous tissue: Secondary | ICD-10-CM

## 2021-07-14 ENCOUNTER — Encounter: Payer: Self-pay | Admitting: Dermatology

## 2021-07-14 NOTE — Progress Notes (Signed)
   New Patient   Subjective  Barbara Munoz is a 70 y.o. female who presents for the following: New Patient (Initial Visit) (Patient here today for skin check. Per patient she has a lesion on her right forehead and left cheek x 1 year no bleeding, no pain, just rough. Per patient she also has mole on her left abdomen that has changed and one mole on right hip that has changed also. Per patient both moles are just larger no bleeding. No personal history or family history of atypical moles, melanoma or non mole skin cancer. ).  General skin examination, several specific spots she would like to point out to me. Location:  Duration:  Quality:  Associated Signs/Symptoms: Modifying Factors:  Severity:  Timing: Context:    The following portions of the chart were reviewed this encounter and updated as appropriate:  Tobacco  Allergies  Meds  Problems  Med Hx  Surg Hx  Fam Hx      Objective  Well appearing patient in no apparent distress; mood and affect are within normal limits. Left Upper Back, Mid Occipital Scalp Red violet smooth dermal 3 mm papules  Left Parotid Area, Right Forehead (2) 2 to 3 mm hornlike Pink crusts  Head to Toe Head to toe exam today no signs of atypical moles, melanoma or  non mole skin cancer.   Right Thigh - Anterior Several brown textured flattopped 5 mm papules    A full examination was performed including scalp, head, eyes, ears, nose, lips, neck, chest, axillae, abdomen, back, buttocks, bilateral upper extremities, bilateral lower extremities, hands, feet, fingers, toes, fingernails, and toenails. All findings within normal limits unless otherwise noted below.  Areas beneath undergarments not fully examined.   Assessment & Plan  Hemangioma of skin (2) Left Upper Back; Mid Occipital Scalp  Benign okay to leave.   AK (actinic keratosis) (3) Left Parotid Area; Right Forehead (2)  Destruction of lesion - Left Parotid Area, Right  Forehead Complexity: simple   Destruction method: cryotherapy   Informed consent: discussed and consent obtained   Timeout:  patient name, date of birth, surgical site, and procedure verified Lesion destroyed using liquid nitrogen: Yes   Cryotherapy cycles:  5 Outcome: patient tolerated procedure well with no complications   Post-procedure details: wound care instructions given    Skin exam for malignant neoplasm Head to Toe  Yearly skin check  Seborrheic keratosis Right Thigh - Anterior  Leave if stable

## 2021-08-07 DIAGNOSIS — Z23 Encounter for immunization: Secondary | ICD-10-CM | POA: Diagnosis not present

## 2021-08-08 ENCOUNTER — Encounter: Payer: Self-pay | Admitting: Family Medicine

## 2021-08-08 ENCOUNTER — Ambulatory Visit: Payer: Medicare PPO | Admitting: Family Medicine

## 2021-08-08 ENCOUNTER — Other Ambulatory Visit: Payer: Self-pay

## 2021-08-08 VITALS — BP 102/64 | HR 74 | Temp 97.4°F | Resp 16 | Ht 63.0 in | Wt 156.0 lb

## 2021-08-08 DIAGNOSIS — M79674 Pain in right toe(s): Secondary | ICD-10-CM

## 2021-08-08 DIAGNOSIS — M79675 Pain in left toe(s): Secondary | ICD-10-CM | POA: Diagnosis not present

## 2021-08-08 DIAGNOSIS — F419 Anxiety disorder, unspecified: Secondary | ICD-10-CM | POA: Diagnosis not present

## 2021-08-08 DIAGNOSIS — R202 Paresthesia of skin: Secondary | ICD-10-CM | POA: Diagnosis not present

## 2021-08-08 DIAGNOSIS — I951 Orthostatic hypotension: Secondary | ICD-10-CM

## 2021-08-08 DIAGNOSIS — E7800 Pure hypercholesterolemia, unspecified: Secondary | ICD-10-CM | POA: Diagnosis not present

## 2021-08-08 LAB — COMPREHENSIVE METABOLIC PANEL
ALT: 27 U/L (ref 0–35)
AST: 25 U/L (ref 0–37)
Albumin: 4.3 g/dL (ref 3.5–5.2)
Alkaline Phosphatase: 47 U/L (ref 39–117)
BUN: 17 mg/dL (ref 6–23)
CO2: 27 mEq/L (ref 19–32)
Calcium: 9.2 mg/dL (ref 8.4–10.5)
Chloride: 104 mEq/L (ref 96–112)
Creatinine, Ser: 0.73 mg/dL (ref 0.40–1.20)
GFR: 83.73 mL/min (ref >60.00)
Glucose, Bld: 84 mg/dL (ref 70–99)
Potassium: 4 mEq/L (ref 3.5–5.1)
Sodium: 139 mEq/L (ref 135–145)
Total Bilirubin: 0.5 mg/dL (ref 0.2–1.2)
Total Protein: 6.8 g/dL (ref 6.0–8.3)

## 2021-08-08 LAB — LIPID PANEL
Cholesterol: 174 mg/dL (ref 0–200)
HDL: 50.4 mg/dL (ref >39.00)
LDL Cholesterol: 89 mg/dL (ref 0–99)
NonHDL: 123.1
Total CHOL/HDL Ratio: 3
Triglycerides: 170 mg/dL — ABNORMAL HIGH (ref 0.0–149.0)
VLDL: 34 mg/dL (ref 0.0–40.0)

## 2021-08-08 MED ORDER — ALPRAZOLAM 0.25 MG PO TABS: 0.2500 mg | ORAL_TABLET | Freq: Two times a day (BID) | ORAL | 1 refills | Status: DC | PRN

## 2021-08-08 NOTE — Progress Notes (Signed)
OFFICE VISIT  08/08/2021  CC:  Chief Complaint  Patient presents with   Follow-up    RCI, 40mo Pt is fasting   HPI:    Patient is a 70y.o. Caucasian female who presents for f/u GAD, hx of depression, HLD, and more recent problem of low bp/orthostatic hypotension. I last saw her about 2 mo ago. A/P as of that visit: "Hypotension, unknown etiology. She is feeling signif improvement with florinef 0.'1mg'$  qd and consistent good intake of fluids.   Will check bmet as well as serum cortisol and ACTH levels today"  INTERIM HX: Feeling okay, not great. Good days and bad days.  BP/orthostatic sx's: overall doing well with this, no signif dizziness spells. Last visit her cortisol, renin, ACTH, and aldosterone were all wnl. Home bps more consistently >100/60.  Some still in 80s/50s, HR 60s-80s. No clear correlation with feeling bad or good.  Anx/mood: still struggles with chronically feeling anxious and keyed up. Husband chronically ill and not doing well, some stressful time with family members lately. Trying to cope with taking breaks and breathing.  Feels heavy in chest when her stress is heavy, doesn't do that lately at all though.   HLD: tolerating simvastatin '40mg'$  qd longterm.  In Feb this year had chol levels were up from her baseline but I chose not to change her med at that time.   PMP AWARE reviewed today: most recent rx for alprazolam was filled 02/08/21, # [id number] rx by me. No red flags.  ROS as above, plus--> no fevers, no CP, no SOB, no wheezing, no cough, no dizziness, mild HAs fairly commonly, no rashes, no melena/hematochezia.  No polyuria or polydipsia.  No myalgias.  Has some tingling and soreness in toes, often more as the day goes on. No color changes.   No focal weakness or tremors.  No acute vision or hearing abnormalities.  No dysuria or unusual/new urinary urgency or frequency.  No recent changes in lower legs. No n/v/d or abd pain.  No palpitations.     Past Medical  History:  Diagnosis Date   Anxiety and depression 09/08/2011   Female bladder prolapse    GERD (gastroesophageal reflux disease) 09/05/2011   Improved signif with PPI   History of adenomatous polyp of colon 08/2017   Recall 3-5 yrs   History of Clostridium difficile infection 2007   colonic changes c/w with this dx seen on colonoscopy 2007---sx's responded to flagyl.   History of cyst of breast    multiple drained during menopause   Hyperlipidemia    IFG (impaired fasting glucose) 06/2018   Fastings 105-115 range when pt checked with husband's glucometer.  Fasting gluc here was 98 and A1c 5.6% 07/01/18.   Insomnia 09/05/2011   Migraine    worst during menopause   Overweight (BMI 25.0-29.9) 09/05/2011   Seasonal allergic rhinitis    Subclinical hypothyroidism    Vertigo, benign positional 09/08/2011    Past Surgical History:  Procedure Laterality Date   CARDIOVASCULAR STRESS TEST  02/15/15   Exercise myocard perfusion testing: Normal EF, normal wall motion, normal perfusion.  Poor exercise capacity (4 min).   COLONOSCOPY  07/2006; 08/25/17   2007: pseudomembranous colitis, o/w normal. (Dr. JArdis Hughs.  08/2017: adenomatous polyp--recall 3-5 yrs   COSMETIC SURGERY     DEXA  06/19/09; 01/19/2019   Normal 2010 and 2020.   POSTERIOR REPAIR  2012   TONSILLECTOMY  82   Transvaginal ultrasound  10/2016   Normal (done for LLQ  pain by Dr. Benay Pike).   TUBAL LIGATION  1983   vaginal hernia repair  6-12   rectocele repair    Outpatient Medications Prior to Visit  Medication Sig Dispense Refill   aspirin 81 MG tablet Take 81 mg by mouth daily.     B Complex Vitamins (VITAMIN B-COMPLEX) TABS Takes 1/2 tablet daily     Calcium Carbonate-Vitamin D 600-400 MG-UNIT tablet Take 1 tablet by mouth daily.     Ferrous Sulfate (IRON) 325 (65 Fe) MG TABS Take by mouth daily.      fludrocortisone (FLORINEF) 0.1 MG tablet Take 1 tablet (0.1 mg total) by mouth daily. 90 tablet 3   loratadine (CLARITIN) 10  MG tablet Take 10 mg by mouth daily as needed. Spring and fall     Magnesium 250 MG TABS Take 250 mg by mouth daily.     Melatonin 10 MG TABS Take 5 mg by mouth.     meloxicam (MOBIC) 7.5 MG tablet TAKE 1 TO 2 TABLETS EVERY DAY AS NEEDED FOR MUSCULOSKELETAL PAIN 60 tablet 5   Multiple Vitamin (MULTIVITAMIN) tablet Take 1 tablet by mouth daily.     Omega-3 Fatty Acids (FISH OIL PO) Take by mouth daily.     pantoprazole (PROTONIX) 40 MG tablet Take 1 tablet (40 mg total) by mouth daily as needed. 30 tablet 5   sertraline (ZOLOFT) 100 MG tablet 2 tabs po qhs 60 tablet 11   simvastatin (ZOCOR) 40 MG tablet TAKE 1 TABLET BY MOUTH DAILY AT 6 PM. 90 tablet 1   ALPRAZolam (XANAX) 0.25 MG tablet Take 1 tablet (0.25 mg total) by mouth 2 (two) times daily as needed. 90 tablet 1   No facility-administered medications prior to visit.    No Known Allergies  ROS As per HPI  PE: Vitals with BMI 08/08/2021 06/07/2021 05/24/2021  Height '5\' 3"'$  '5\' 3"'$  -  Weight 156 lbs 155 lbs 6 oz 154 lbs 4 oz  BMI 27.64 [id number]  Systolic [id number]  Diastolic 64 59 72  Pulse 74 79 74   Gen: Alert, well appearing.  Patient is oriented to person, place, time, and situation. AFFECT: pleasant, lucid thought and speech. CV: RRR, no m/r/g.  HR in 70s sitting, stays in 70s upon standing up and she has no dizziness. LUNGS: CTA bilat, nonlabored resps, good aeration in all lung fields. EXT: no clubbing or cyanosis.  no edema.    LABS:  Lab Results  Component Value Date   TSH 3.57 02/08/2021   Lab Results  Component Value Date   WBC 6.3 05/17/2021   HGB 15.1 (H) 05/17/2021   HCT 44.3 05/17/2021   MCV 93.3 05/17/2021   PLT 166 05/17/2021   Lab Results  Component Value Date   CREATININE 0.73 06/07/2021   BUN 18 06/07/2021   NA 141 06/07/2021   K 4.3 06/07/2021   CL 107 06/07/2021   CO2 26 06/07/2021   Lab Results  Component Value Date   ALT 35 02/08/2021   AST 28 02/08/2021   ALKPHOS 53 02/08/2021    BILITOT 0.5 02/08/2021   Lab Results  Component Value Date   CHOL 205 (H) 02/08/2021   Lab Results  Component Value Date   HDL 48.90 02/08/2021   Lab Results  Component Value Date   LDLCALC 118 (H) 02/08/2021   Lab Results  Component Value Date   TRIG 191.0 (H) 02/08/2021   Lab Results  Component Value Date   CHOLHDL  4 02/08/2021   Lab Results  Component Value Date   HGBA1C 5.4 11/08/2019   IMPRESSION AND PLAN:  1) Low bp +orthostatic dizziness: has had decent response to florinef 0.1 mg qd. Continue this.  Cortisol, ACTH, renin, and aldosterone normal in the past. Hydration habits improving.  2) HLD: tolerating simva 40 qd. FLP and cmet today.  3) GAD, hx of depression: pretty stable on sertraline 200 qd. She is coping better with her anxiety.  Continues to take alpraz prn in small amounts. RF'd this med today, #90, rF x 1.  4) Bilat toes "sore" and tingly: waxes and wanes, often more apparent in evenings. Question mild peripheral neuropathy. Obs.  An After Visit Summary was printed and given to the patient.  FOLLOW UP: Return in about 6 months (around 02/08/2022) for annual CPE (fasting).  Signed:  Crissie Sickles, MD           08/08/2021

## 2021-08-29 ENCOUNTER — Ambulatory Visit: Payer: Medicare PPO | Admitting: Dermatology

## 2021-08-30 ENCOUNTER — Other Ambulatory Visit: Payer: Self-pay

## 2021-08-30 ENCOUNTER — Encounter: Payer: Self-pay | Admitting: Family Medicine

## 2021-08-30 ENCOUNTER — Ambulatory Visit (INDEPENDENT_AMBULATORY_CARE_PROVIDER_SITE_OTHER): Payer: Medicare PPO

## 2021-08-30 DIAGNOSIS — Z23 Encounter for immunization: Secondary | ICD-10-CM | POA: Diagnosis not present

## 2021-08-30 NOTE — Progress Notes (Signed)
Influenza immunization was given today, pt tolerated well.

## 2021-09-05 ENCOUNTER — Other Ambulatory Visit: Payer: Self-pay | Admitting: Family Medicine

## 2021-09-17 ENCOUNTER — Other Ambulatory Visit: Payer: Self-pay

## 2021-09-17 ENCOUNTER — Encounter: Payer: Self-pay | Admitting: Family Medicine

## 2021-09-17 ENCOUNTER — Ambulatory Visit: Payer: Medicare PPO | Admitting: Family Medicine

## 2021-09-17 VITALS — BP 91/61 | HR 78 | Temp 97.5°F | Ht 63.0 in | Wt 154.2 lb

## 2021-09-17 DIAGNOSIS — I959 Hypotension, unspecified: Secondary | ICD-10-CM

## 2021-09-17 DIAGNOSIS — I951 Orthostatic hypotension: Secondary | ICD-10-CM

## 2021-09-17 DIAGNOSIS — R5383 Other fatigue: Secondary | ICD-10-CM

## 2021-09-17 LAB — CBC WITH DIFFERENTIAL/PLATELET
Basophils Absolute: 0 10*3/uL (ref 0.0–0.1)
Basophils Relative: 0.7 % (ref 0.0–3.0)
Eosinophils Absolute: 0.1 10*3/uL (ref 0.0–0.7)
Eosinophils Relative: 1.6 % (ref 0.0–5.0)
HCT: 43.5 % (ref 36.0–46.0)
Hemoglobin: 14.5 g/dL (ref 12.0–15.0)
Lymphocytes Relative: 34.2 % (ref 12.0–46.0)
Lymphs Abs: 2.2 10*3/uL (ref 0.7–4.0)
MCHC: 33.4 g/dL (ref 30.0–36.0)
MCV: 93.1 fl (ref 78.0–100.0)
Monocytes Absolute: 0.5 10*3/uL (ref 0.1–1.0)
Monocytes Relative: 8.5 % (ref 3.0–12.0)
Neutro Abs: 3.5 10*3/uL (ref 1.4–7.7)
Neutrophils Relative %: 55 % (ref 43.0–77.0)
Platelets: 156 10*3/uL (ref 150.0–400.0)
RBC: 4.67 Mil/uL (ref 3.87–5.11)
RDW: 13 % (ref 11.5–15.5)
WBC: 6.4 10*3/uL (ref 4.0–10.5)

## 2021-09-17 LAB — COMPREHENSIVE METABOLIC PANEL WITH GFR
ALT: 41 U/L — ABNORMAL HIGH (ref 0–35)
AST: 36 U/L (ref 0–37)
Albumin: 4.4 g/dL (ref 3.5–5.2)
Alkaline Phosphatase: 50 U/L (ref 39–117)
BUN: 14 mg/dL (ref 6–23)
CO2: 26 meq/L (ref 19–32)
Calcium: 9.5 mg/dL (ref 8.4–10.5)
Chloride: 103 meq/L (ref 96–112)
Creatinine, Ser: 0.72 mg/dL (ref 0.40–1.20)
GFR: 85.06 mL/min
Glucose, Bld: 90 mg/dL (ref 70–99)
Potassium: 4.1 meq/L (ref 3.5–5.1)
Sodium: 140 meq/L (ref 135–145)
Total Bilirubin: 0.5 mg/dL (ref 0.2–1.2)
Total Protein: 7.1 g/dL (ref 6.0–8.3)

## 2021-09-17 LAB — TSH: TSH: 1.67 u[IU]/mL (ref 0.35–5.50)

## 2021-09-17 MED ORDER — MIDODRINE HCL 2.5 MG PO TABS: ORAL_TABLET | ORAL | 0 refills | Status: DC

## 2021-09-17 NOTE — Progress Notes (Signed)
OFFICE VISIT  09/17/2021  CC:  Chief Complaint  Patient presents with   Blood Pressure Concerns    HPI:    Patient is a 70 y.o. Caucasian female who presents for blood pressure concerns. I last saw her 08/08/21. A/P as of last visit: "1) Low bp +orthostatic dizziness: has had decent response to florinef 0.1 mg qd. Continue this.  Cortisol, ACTH, renin, and aldosterone normal in the past. Hydration habits improving.   2) HLD: tolerating simva 40 qd. FLP and cmet today.   3) GAD, hx of depression: pretty stable on sertraline 200 qd. She is coping better with her anxiety.  Continues to take alpraz prn in small amounts. RF'd this med today, #90, rF x 1.   4) Bilat toes "sore" and tingly: waxes and wanes, often more apparent in evenings. Question mild peripheral neuropathy. Obs."  INTERIM HX: Fatigue worsening x 10d or so.   Started with acute onset of R hip pain that was severe and unprovoked and isolated. Felt washed out and since then the hip has not bothered her but fatigue persists, sometimes feels weak-kneed and wobbly when walking.  Has some occ lightheaded feeling when going from supine or sitting to standing position, but not consistent. No presyncope. No falls.  Stress/anxiety up, taking 1/2-1 alpraz 0.25mg  tabs most nights, not in daytime.  She reports long hx of driving herself hard for a while and then crashing, so to speak. Motivation down, mainly d/t poor energy.  Says no sadness or hopelessness but then says her depression in the past has manifested mainly as fatigue. No crying spells.    Home bp's the last month avg 90s/60s, occ up into 110-120 syst and 70s-80s diast. She has documented some signif bp drops going from sitting to standing. Taking fludrocortisone 0.1mg  qd. Not eating high sodium diet.  ROS as above, plus--> intermittent R leg restlessness feeling, no similar sxs in L leg.  Occ brief numbness of top lip. No fevers, no CP, no SOB, no wheezing, no  cough, no HAs, no rashes, no melena/hematochezia.  No polyuria or polydipsia.  No myalgias or arthralgias.  No focal weakness, paresthesias, or tremors.  No acute vision or hearing abnormalities.  No dysuria or unusual/new urinary urgency or frequency.  No recent changes in lower legs. No n/v/d or abd pain.  No palpitations.     Past Medical History:  Diagnosis Date   Anxiety and depression 09/08/2011   Female bladder prolapse    GERD (gastroesophageal reflux disease) 09/05/2011   Improved signif with PPI   History of adenomatous polyp of colon 08/2017   Recall 3-5 yrs   History of Clostridium difficile infection 2007   colonic changes c/w with this dx seen on colonoscopy 2007---sx's responded to flagyl.   History of cyst of breast    multiple drained during menopause   Hyperlipidemia    Hypotension 2022   ?idiopathic   IFG (impaired fasting glucose) 06/2018   Fastings 105-115 range when pt checked with husband's glucometer.  Fasting gluc here was 98 and A1c 5.6% 07/01/18.   Insomnia 09/05/2011   Migraine    worst during menopause   Overweight (BMI 25.0-29.9) 09/05/2011   Seasonal allergic rhinitis    Subclinical hypothyroidism    Vertigo, benign positional 09/08/2011    Past Surgical History:  Procedure Laterality Date   CARDIOVASCULAR STRESS TEST  02/15/15   Exercise myocard perfusion testing: Normal EF, normal wall motion, normal perfusion.  Poor exercise capacity (4 min).  COLONOSCOPY  07/2006; 08/25/17   2007: pseudomembranous colitis, o/w normal. (Dr. Ardis Hughs).  08/2017: adenomatous polyp--recall 3-5 yrs   COSMETIC SURGERY     DEXA  06/19/09; 01/19/2019   Normal 2010 and 2020.   POSTERIOR REPAIR  2012   TONSILLECTOMY  82   Transvaginal ultrasound  10/2016   Normal (done for LLQ pain by Dr. Benay Pike).   TUBAL LIGATION  1983   vaginal hernia repair  6-12   rectocele repair    Outpatient Medications Prior to Visit  Medication Sig Dispense Refill   ALPRAZolam (XANAX)  0.25 MG tablet Take 1 tablet (0.25 mg total) by mouth 2 (two) times daily as needed. 90 tablet 1   aspirin 81 MG tablet Take 81 mg by mouth daily.     B Complex Vitamins (VITAMIN B-COMPLEX) TABS Takes 1/2 tablet daily     Calcium Carbonate-Vitamin D 600-400 MG-UNIT tablet Take 1 tablet by mouth daily.     Ferrous Sulfate (IRON) 325 (65 Fe) MG TABS Take by mouth daily.      fludrocortisone (FLORINEF) 0.1 MG tablet Take 1 tablet (0.1 mg total) by mouth daily. 90 tablet 3   loratadine (CLARITIN) 10 MG tablet Take 10 mg by mouth daily as needed. Spring and fall     Magnesium 250 MG TABS Take 250 mg by mouth daily.     Melatonin 10 MG TABS Take 5 mg by mouth.     meloxicam (MOBIC) 7.5 MG tablet TAKE 1 TO 2 TABLETS EVERY DAY AS NEEDED FOR MUSCULOSKELETAL PAIN 60 tablet 5   Multiple Vitamin (MULTIVITAMIN) tablet Take 1 tablet by mouth daily.     Omega-3 Fatty Acids (FISH OIL PO) Take by mouth daily.     pantoprazole (PROTONIX) 40 MG tablet Take 1 tablet (40 mg total) by mouth daily as needed. 30 tablet 5   sertraline (ZOLOFT) 100 MG tablet 2 tabs po qhs 60 tablet 11   simvastatin (ZOCOR) 40 MG tablet TAKE 1 TABLET BY MOUTH DAILY AT 6 PM. 90 tablet 1   No facility-administered medications prior to visit.    No Known Allergies  ROS As per HPI  PE: Vitals with BMI 09/17/2021 08/08/2021 06/07/2021  Height 5\' 3"  5\' 3"  5\' 3"   Weight 154 lbs 3 oz 156 lbs 155 lbs 6 oz  BMI 27.32 40.98 11.91  Systolic 91 478 99  Diastolic 61 64 59  Pulse 78 74 79    Gen: Alert, well appearing.  Patient is oriented to person, place, time, and situation. AFFECT: pleasant, lucid thought and speech. CV: RRR, no m/r/g.   LUNGS: CTA bilat, nonlabored resps, good aeration in all lung fields. EXT: no clubbing or cyanosis.  no edema. SKIN: no rash, jaundice, or pallor.    LABS:    Chemistry      Component Value Date/Time   NA 139 08/08/2021 1023   K 4.0 08/08/2021 1023   CL 104 08/08/2021 1023   CO2 27  08/08/2021 1023   BUN 17 08/08/2021 1023   CREATININE 0.73 08/08/2021 1023      Component Value Date/Time   CALCIUM 9.2 08/08/2021 1023   ALKPHOS 47 08/08/2021 1023   AST 25 08/08/2021 1023   ALT 27 08/08/2021 1023   BILITOT 0.5 08/08/2021 1023     Lab Results  Component Value Date   WBC 6.3 05/17/2021   HGB 15.1 (H) 05/17/2021   HCT 44.3 05/17/2021   MCV 93.3 05/17/2021   PLT 166 05/17/2021  Lab Results  Component Value Date   TSH 3.57 02/08/2021   Lab Results  Component Value Date   HGBA1C 5.4 11/08/2019   Lab Results  Component Value Date   ESRSEDRATE 25 03/12/2017   IMPRESSION AND PLAN:  1) Fatigue, acute on chronic. I believe most of her symptoms are d/t baseline hypotension (suspect idiopathic) and orthostatic hypotension.  She seemed to stabilize after we got her on fludrocortisone but pressures still quite low. Cortisol, ACTH, renin, and aldosterone normal in the past. Check plasma fractionated catecholamines, looking for low norepi level. Encouraged high sodium diet, continue florinef 0.1mg  qd, and add midodrine 2.5mg  tid. After 2d, if not feeling much better and bp's not rising much then inc to 2 tabs tid. Checking CMET, CBC, TSH today. I don't know quite what to make of her new RLS (R leg only) and brief episodes of numbness of top lip. Given these nonspecific sx's + fatigue, I'll check ANA panel today.  If we can't get bp up and her feeling better then we'll ask cardiology to see her. Will hold off on echo at this time--no sign of volume overload.  An After Visit Summary was printed and given to the patient.  FOLLOW UP: Return for 7-10d f/u bp and fatigue. Cpe 01/2022  Signed:  Crissie Sickles, MD           09/17/2021

## 2021-09-19 ENCOUNTER — Ambulatory Visit: Payer: Medicare PPO | Admitting: Family Medicine

## 2021-09-24 ENCOUNTER — Other Ambulatory Visit: Payer: Self-pay | Admitting: Family Medicine

## 2021-09-25 LAB — ANTI-NUCLEAR AB-TITER (ANA TITER)
ANA TITER: 1:40 {titer} — ABNORMAL HIGH
ANA TITER: 1:40 {titer} — ABNORMAL HIGH
ANA Titer 1: 1:40 {titer} — ABNORMAL HIGH

## 2021-09-25 LAB — TIER 1
Chromatin (Nucleosomal) Antibody: <1 AI
ENA SM Ab Ser-aCnc: <1 AI
Ribonucleic Protein(ENA) Antibody, IgG: <1 AI
SM/RNP: <1 AI
ds DNA Ab: <1 IU/mL

## 2021-09-25 LAB — CATECHOLAMINES, FRACTIONATED, PLASMA
Dopamine: 15 pg/mL
Epinephrine: <20 pg/mL
Norepinephrine: 426 pg/mL
Total Catecholamines: 441 pg/mL

## 2021-09-25 LAB — ANA SCREEN,IFA,REFLEX TITER/PATTERN,REFLEX MPLX 11 AB CASCADE
14-3-3 eta Protein: <0.2 ng/mL (ref <0.2)
Anti Nuclear Antibody (ANA): POSITIVE — AB
Cyclic Citrullin Peptide Ab: <16 UNITS
Rheumatoid fact SerPl-aCnc: <14 IU/mL (ref <14)

## 2021-09-25 LAB — TIER 2
Jo-1 Autoabs: <1 AI
SSA (Ro) (ENA) Antibody, IgG: <1 AI
SSB (La) (ENA) Antibody, IgG: <1 AI
Scleroderma (Scl-70) (ENA) Antibody, IgG: <1 AI

## 2021-09-25 LAB — TIER 3
Centromere Ab Screen: <1 AI
Ribosomal P Protein Ab: <1 AI

## 2021-09-25 LAB — INTERPRETATION

## 2021-09-26 ENCOUNTER — Other Ambulatory Visit: Payer: Self-pay

## 2021-09-26 ENCOUNTER — Encounter: Payer: Self-pay | Admitting: Family Medicine

## 2021-09-26 ENCOUNTER — Ambulatory Visit: Payer: Medicare PPO | Admitting: Family Medicine

## 2021-09-26 VITALS — BP 102/60 | HR 70 | Temp 98.0°F | Ht 63.0 in | Wt 157.2 lb

## 2021-09-26 DIAGNOSIS — I951 Orthostatic hypotension: Secondary | ICD-10-CM | POA: Diagnosis not present

## 2021-09-26 DIAGNOSIS — I950 Idiopathic hypotension: Secondary | ICD-10-CM

## 2021-09-26 DIAGNOSIS — F330 Major depressive disorder, recurrent, mild: Secondary | ICD-10-CM | POA: Diagnosis not present

## 2021-09-26 DIAGNOSIS — R5383 Other fatigue: Secondary | ICD-10-CM | POA: Diagnosis not present

## 2021-09-26 LAB — VITAMIN B12: Vitamin B-12: 292 pg/mL (ref 211–911)

## 2021-09-26 MED ORDER — AMPHETAMINE-DEXTROAMPHET ER 10 MG PO CP24: 10.0000 mg | ORAL_CAPSULE | Freq: Every day | ORAL | 0 refills | Status: DC

## 2021-09-26 MED ORDER — MIDODRINE HCL 5 MG PO TABS: 5.0000 mg | ORAL_TABLET | Freq: Three times a day (TID) | ORAL | 3 refills | Status: DC

## 2021-09-26 MED ORDER — PANTOPRAZOLE SODIUM 40 MG PO TBEC: 40.0000 mg | DELAYED_RELEASE_TABLET | Freq: Every day | ORAL | 3 refills | Status: DC | PRN

## 2021-09-26 NOTE — Progress Notes (Signed)
OFFICE VISIT  09/26/2021  CC:  Chief Complaint  Patient presents with   Follow-up    Fatigue, low blood pressure    HPI:    Patient is a 70 y.o. female who presents accompanied by her husband Jonni Sanger for 10d f/u fatigue and low blood pressure/orthostatic hypotension. A/P as of last visit: "1) Fatigue, acute on chronic. I believe most of her symptoms are d/t baseline hypotension (suspect idiopathic) and orthostatic hypotension.  She seemed to stabilize after we got her on fludrocortisone but pressures still quite low. Cortisol, ACTH, renin, and aldosterone normal in the past. Check plasma fractionated catecholamines, looking for low norepi level. Encouraged high sodium diet, continue florinef 0.1mg  qd, and add midodrine 2.5mg  tid. After 2d, if not feeling much better and bp's not rising much then inc to 2 tabs tid. Checking CMET, CBC, TSH today. I don't know quite what to make of her new RLS (R leg only) and brief episodes of numbness of top lip. Given these nonspecific sx's + fatigue, I'll check ANA panel today. If we can't get bp up and her feeling better then we'll ask cardiology to see her. Will hold off on echo at this time--no sign of volume overload."  INTERIM HX: Feeling about 40-50% better regarding her fatigue and has only had 1 lightheaded spell and it was brief and with standing up. BPs coming up more consistently into low 616W systolic, still in 73X diastolic.  No palpitations or tachycardia. Notes no side effects from the midodrine.  She went up to 2 of the 25mg  tabs tid 6 days ago. We reviewed her most recent labs in detail today---all normal except ANA 1:40, suspect not clinically significant. No new sx's.  Past Medical History:  Diagnosis Date   Anxiety and depression 09/08/2011   Female bladder prolapse    GERD (gastroesophageal reflux disease) 09/05/2011   Improved signif with PPI   History of adenomatous polyp of colon 08/2017   Recall 3-5 yrs   History of  Clostridium difficile infection 2007   colonic changes c/w with this dx seen on colonoscopy 2007---sx's responded to flagyl.   History of cyst of breast    multiple drained during menopause   Hyperlipidemia    Hypotension 2022   ?idiopathic   IFG (impaired fasting glucose) 06/2018   Fastings 105-115 range when pt checked with husband's glucometer.  Fasting gluc here was 98 and A1c 5.6% 07/01/18.   Insomnia 09/05/2011   Migraine    worst during menopause   Overweight (BMI 25.0-29.9) 09/05/2011   Seasonal allergic rhinitis    Subclinical hypothyroidism    Vertigo, benign positional 09/08/2011    Past Surgical History:  Procedure Laterality Date   CARDIOVASCULAR STRESS TEST  02/15/15   Exercise myocard perfusion testing: Normal EF, normal wall motion, normal perfusion.  Poor exercise capacity (4 min).   COLONOSCOPY  07/2006; 08/25/17   2007: pseudomembranous colitis, o/w normal. (Dr. Ardis Hughs).  08/2017: adenomatous polyp--recall 3-5 yrs   COSMETIC SURGERY     DEXA  06/19/09; 01/19/2019   Normal 2010 and 2020.   POSTERIOR REPAIR  2012   TONSILLECTOMY  82   Transvaginal ultrasound  10/2016   Normal (done for LLQ pain by Dr. Benay Pike).   TUBAL LIGATION  1983   vaginal hernia repair  6-12   rectocele repair    Outpatient Medications Prior to Visit  Medication Sig Dispense Refill   ALPRAZolam (XANAX) 0.25 MG tablet Take 1 tablet (0.25 mg total) by mouth 2 (  two) times daily as needed. 90 tablet 1   aspirin 81 MG tablet Take 81 mg by mouth daily.     B Complex Vitamins (VITAMIN B-COMPLEX) TABS Takes 1/2 tablet daily     Calcium Carbonate-Vitamin D 600-400 MG-UNIT tablet Take 1 tablet by mouth daily.     Ferrous Sulfate (IRON) 325 (65 Fe) MG TABS Take by mouth daily.      fludrocortisone (FLORINEF) 0.1 MG tablet Take 1 tablet (0.1 mg total) by mouth daily. 90 tablet 3   Magnesium 250 MG TABS Take 250 mg by mouth daily.     Melatonin 10 MG TABS Take 5 mg by mouth.     meloxicam (MOBIC)  7.5 MG tablet TAKE 1 TO 2 TABLETS EVERY DAY AS NEEDED FOR MUSCULOSKELETAL PAIN 60 tablet 5   Multiple Vitamin (MULTIVITAMIN) tablet Take 1 tablet by mouth daily.     Omega-3 Fatty Acids (FISH OIL PO) Take by mouth daily.     sertraline (ZOLOFT) 100 MG tablet 2 tabs po qhs 60 tablet 11   simvastatin (ZOCOR) 40 MG tablet TAKE 1 TABLET BY MOUTH DAILY AT 6 PM. 90 tablet 1   midodrine (PROAMATINE) 2.5 MG tablet 1-2 tabs po tid.  Do not take within 4 hours of bedtime. 90 tablet 0   pantoprazole (PROTONIX) 40 MG tablet Take 1 tablet (40 mg total) by mouth daily as needed. 30 tablet 5   loratadine (CLARITIN) 10 MG tablet Take 10 mg by mouth daily as needed. Spring and fall (Patient not taking: Reported on 09/26/2021)     No facility-administered medications prior to visit.    No Known Allergies  ROS As per HPI  PE: Vitals with BMI 09/26/2021 09/17/2021 08/08/2021  Height 5\' 3"  5\' 3"  5\' 3"   Weight 157 lbs 3 oz 154 lbs 3 oz 156 lbs  BMI 27.85 53.29 92.42  Systolic 683 91 419  Diastolic 60 61 64  Pulse 70 78 74   Gen: Alert, well appearing.  Patient is oriented to person, place, time, and situation. AFFECT: pleasant, lucid thought and speech. No further exam today.  LABS:  Lab Results  Component Value Date   TSH 1.67 09/17/2021   Lab Results  Component Value Date   WBC 6.4 09/17/2021   HGB 14.5 09/17/2021   HCT 43.5 09/17/2021   MCV 93.1 09/17/2021   PLT 156.0 09/17/2021   Lab Results  Component Value Date   VITAMINB12 292 09/26/2021   Lab Results  Component Value Date   CREATININE 0.72 09/17/2021   BUN 14 09/17/2021   NA 140 09/17/2021   K 4.1 09/17/2021   CL 103 09/17/2021   CO2 26 09/17/2021   Lab Results  Component Value Date   ALT 41 (H) 09/17/2021   AST 36 09/17/2021   ALKPHOS 50 09/17/2021   BILITOT 0.5 09/17/2021   Lab Results  Component Value Date   ESRSEDRATE 25 03/12/2017   Lab Results  Component Value Date   CRP 0.1 (L) 03/12/2017   Lab Results   Component Value Date   ANA POSITIVE (A) 09/17/2021   RF <14 09/17/2021  ANA 1:40 on 09/17/21.  Remainder of rheum/autoimmune panel NEG. Serum catecholamine levels all wnl on 09/17/21.   IMPRESSION AND PLAN:  1) Hypotension with orthostatic hypotension coupled with significant fatigue. Idiopathic etiology is working dx.  Extensive lab evaluation is unremarkable.   Cardiopulmonary exam normal. She has improved some with florinef initially and again most recently with addition of midodrine.  Plan: Cont midodrine 50mg  tid and florinef 0.1 qd. Compression stockings recommended, also cont efforts at inc Na in diet. Cont periodic home bp and hr monitoring. Cardiology referral today--she prefers Dr. Burt Knack b/c he saw her husband and liked him a lot.  2) GAD, hx of MDD.  She actually seems pretty darn good from this standpoint but at times she is hard to read. Hard to tell how much of this is contributing to her fatigue. Continue sertraline 200 mg qhs. She will consider trial of adderall xr 10mg  qd as augmenting agent for mood d/o, help with energy level, and potential help with her hypotension.  She will wait a little longer to see if she continues to improve before filling rx.  3) FH pernicious anemia: given pt's fatigue she asked that we check b12 level--ordered. She had normal Hb and normal MCV on testing 10 d/a.  An After Visit Summary was printed and given to the patient.  FOLLOW UP: Return in about 4 weeks (around 10/24/2021) for f/u fatigue/hypotension.  Signed:  Crissie Sickles, MD           09/26/2021

## 2021-09-27 ENCOUNTER — Other Ambulatory Visit: Payer: Medicare PPO

## 2021-09-27 DIAGNOSIS — E538 Deficiency of other specified B group vitamins: Secondary | ICD-10-CM | POA: Diagnosis not present

## 2021-09-30 LAB — INTRINSIC FACTOR ANTIBODIES: Intrinsic Factor: NEGATIVE

## 2021-09-30 LAB — METHYLMALONIC ACID, SERUM: Methylmalonic Acid, Quant: 141 nmol/L (ref 87–318)

## 2021-10-24 ENCOUNTER — Other Ambulatory Visit: Payer: Self-pay

## 2021-10-24 ENCOUNTER — Ambulatory Visit: Payer: Medicare PPO | Admitting: Family Medicine

## 2021-10-24 ENCOUNTER — Encounter: Payer: Self-pay | Admitting: Family Medicine

## 2021-10-24 VITALS — BP 103/66 | HR 68 | Temp 98.0°F | Ht 63.0 in | Wt 157.0 lb

## 2021-10-24 DIAGNOSIS — Z8659 Personal history of other mental and behavioral disorders: Secondary | ICD-10-CM | POA: Diagnosis not present

## 2021-10-24 DIAGNOSIS — I950 Idiopathic hypotension: Secondary | ICD-10-CM | POA: Diagnosis not present

## 2021-10-24 DIAGNOSIS — F411 Generalized anxiety disorder: Secondary | ICD-10-CM | POA: Diagnosis not present

## 2021-10-24 DIAGNOSIS — E538 Deficiency of other specified B group vitamins: Secondary | ICD-10-CM

## 2021-10-24 NOTE — Progress Notes (Signed)
OFFICE VISIT  10/24/2021  CC:  Chief Complaint  Patient presents with   Follow-up    Fatigue and hypotension; pt states she feels pretty good and feels more like herself for the past 2 weeks. Minor bouts of dizziness   HPI:    Patient is a 70 y.o. female who presents for 1 mo f/u fatigue and hypotension. A/P as of last visit: "1) Hypotension with orthostatic hypotension coupled with significant fatigue. Idiopathic etiology is working dx.  Extensive lab evaluation is unremarkable.   Cardiopulmonary exam normal. She has improved some with florinef initially and again most recently with addition of midodrine. Plan: Cont midodrine 50mg  tid and florinef 0.1 qd. Compression stockings recommended, also cont efforts at inc Na in diet. Cont periodic home bp and hr monitoring. Cardiology referral today--she prefers Dr. Burt Knack b/c he saw her husband and liked him a lot.   2) GAD, hx of MDD.  She actually seems pretty darn good from this standpoint but at times she is hard to read. Hard to tell how much of this is contributing to her fatigue. Continue sertraline 200 mg qhs. She will consider trial of adderall xr 10mg  qd as augmenting agent for mood d/o, help with energy level, and potential help with her hypotension.  She will wait a little longer to see if she continues to improve before filling rx.   3) FH pernicious anemia: given pt's fatigue she asked that we check b12 level--ordered. She had normal Hb and normal MCV on testing 10 d/a."  INTERIM HX: Barbara Munoz says she feels much improved compared to last time.  Almost feels back to her normal self.  More motivated and getting out of bed every day with her mind on something to do.  Hardly any spells of dizziness.  Home blood pressures consistently over 938 systolic.  Continues on midodrine 5 mg 3 times a day and Florinef 0.1 mg/day.  Labs last visit showed vit B12 level borderline low (292.  Intrinsic factor Ab NEG and methylmalonic acid level  normal). She has now been on vitamin B12 1000 mcg a day for several weeks.  She picked up her Adderall but did not start it.  She had begun to feel so she held off on this medication.  PMP AWARE reviewed today: most recent rx for adderall xr 10mg  was filled 09/26/21, # 30, rx by me. No red flags.  Past Medical History:  Diagnosis Date   Anxiety and depression 09/08/2011   Female bladder prolapse    GERD (gastroesophageal reflux disease) 09/05/2011   Improved signif with PPI   History of adenomatous polyp of colon 08/2017   Recall 3-5 yrs   History of Clostridium difficile infection 2007   colonic changes c/w with this dx seen on colonoscopy 2007---sx's responded to flagyl.   History of cyst of breast    multiple drained during menopause   Hyperlipidemia    Hypotension 2022   ?idiopathic   IFG (impaired fasting glucose) 06/2018   Fastings 105-115 range when pt checked with husband's glucometer.  Fasting gluc here was 98 and A1c 5.6% 07/01/18.   Insomnia 09/05/2011   Migraine    worst during menopause   Overweight (BMI 25.0-29.9) 09/05/2011   Seasonal allergic rhinitis    Subclinical hypothyroidism    Vertigo, benign positional 09/08/2011    Past Surgical History:  Procedure Laterality Date   CARDIOVASCULAR STRESS TEST  02/15/15   Exercise myocard perfusion testing: Normal EF, normal wall motion, normal perfusion.  Poor exercise capacity (4 min).   COLONOSCOPY  07/2006; 08/25/17   2007: pseudomembranous colitis, o/w normal. (Dr. Ardis Hughs).  08/2017: adenomatous polyp--recall 3-5 yrs   COSMETIC SURGERY     DEXA  06/19/09; 01/19/2019   Normal 2010 and 2020.   POSTERIOR REPAIR  2012   TONSILLECTOMY  82   Transvaginal ultrasound  10/2016   Normal (done for LLQ pain by Dr. Benay Pike).   TUBAL LIGATION  1983   vaginal hernia repair  6-12   rectocele repair    Outpatient Medications Prior to Visit  Medication Sig Dispense Refill   ALPRAZolam (XANAX) 0.25 MG tablet Take 1 tablet  (0.25 mg total) by mouth 2 (two) times daily as needed. 90 tablet 1   aspirin 81 MG tablet Take 81 mg by mouth daily.     Calcium Carbonate-Vitamin D 600-400 MG-UNIT tablet Take 1 tablet by mouth daily.     Cyanocobalamin (VITAMIN B12) 500 MCG TABS      Ferrous Sulfate (IRON) 325 (65 Fe) MG TABS Take by mouth daily.      fludrocortisone (FLORINEF) 0.1 MG tablet Take 1 tablet (0.1 mg total) by mouth daily. 90 tablet 3   Magnesium 250 MG TABS Take 250 mg by mouth daily.     Melatonin 10 MG TABS Take 5 mg by mouth.     meloxicam (MOBIC) 7.5 MG tablet TAKE 1 TO 2 TABLETS EVERY DAY AS NEEDED FOR MUSCULOSKELETAL PAIN 60 tablet 5   midodrine (PROAMATINE) 5 MG tablet Take 1 tablet (5 mg total) by mouth 3 (three) times daily with meals. 270 tablet 3   Multiple Vitamin (MULTIVITAMIN) tablet Take 1 tablet by mouth daily.     Omega-3 Fatty Acids (FISH OIL PO) Take by mouth daily.     pantoprazole (PROTONIX) 40 MG tablet Take 1 tablet (40 mg total) by mouth daily as needed. 90 tablet 3   sertraline (ZOLOFT) 100 MG tablet 2 tabs po qhs 60 tablet 11   simvastatin (ZOCOR) 40 MG tablet TAKE 1 TABLET BY MOUTH DAILY AT 6 PM. 90 tablet 1   B Complex Vitamins (VITAMIN B-COMPLEX) TABS 5,000 mcg in the morning and at bedtime.     amphetamine-dextroamphetamine (ADDERALL XR) 10 MG 24 hr capsule Take 1 capsule (10 mg total) by mouth daily. (Patient not taking: Reported on 10/24/2021) 30 capsule 0   loratadine (CLARITIN) 10 MG tablet Take 10 mg by mouth daily as needed. Spring and fall (Patient not taking: No sig reported)     No facility-administered medications prior to visit.    No Known Allergies  ROS As per HPI  PE: Vitals with BMI 10/24/2021 09/26/2021 09/17/2021  Height 5\' 3"  5\' 3"  5\' 3"   Weight 157 lbs 157 lbs 3 oz 154 lbs 3 oz  BMI 27.82 84.13 24.40  Systolic 102 725 91  Diastolic 66 60 61  Pulse 68 70 78     Gen: Alert, well appearing.  Patient is oriented to person, place, time, and  situation. AFFECT: pleasant, lucid thought and speech. CV: RRR, no m/r/g.   LUNGS: CTA bilat, nonlabored resps, good aeration in all lung fields. EXT: no clubbing or cyanosis.  no edema.    LABS:  Lab Results  Component Value Date   TSH 1.67 09/17/2021   Lab Results  Component Value Date   WBC 6.4 09/17/2021   HGB 14.5 09/17/2021   HCT 43.5 09/17/2021   MCV 93.1 09/17/2021   PLT 156.0 09/17/2021   Lab Results  Component Value Date   CREATININE 0.72 09/17/2021   BUN 14 09/17/2021   NA 140 09/17/2021   K 4.1 09/17/2021   CL 103 09/17/2021   CO2 26 09/17/2021   Lab Results  Component Value Date   ALT 41 (H) 09/17/2021   AST 36 09/17/2021   ALKPHOS 50 09/17/2021   BILITOT 0.5 09/17/2021   Lab Results  Component Value Date   CHOL 174 08/08/2021   Lab Results  Component Value Date   HDL 50.40 08/08/2021   Lab Results  Component Value Date   LDLCALC 89 08/08/2021   Lab Results  Component Value Date   TRIG 170.0 (H) 08/08/2021   Lab Results  Component Value Date   CHOLHDL 3 08/08/2021   Lab Results  Component Value Date   HGBA1C 5.4 11/08/2019   Lab Results  Component Value Date   VITAMINB12 292 09/26/2021   IMPRESSION AND PLAN:  1: Idiopathic hypotension.   Doing much better now that she has been on the midodrine for about 6 weeks. Will continue this as well as Florinef 0.1 mg a day.  She will continue to monitor blood pressure at home periodically. Pt has cardiology initial consult set for 11/20/21 with Dr. Oval Linsey. No labs needed day.  2.:  Borderline low vitamin B12: Hard to tell if this has anything to do with the way she feels, but we will press forward with 1000 mcg daily.  Plan recheck B12 level when I see her back in February.  #3: GAD, with history of MDD.  She seems to be doing much better.  Continue sertraline 200 mg a day. We had talked about adding Adderall as an augmenting agent for mood as well as energy. She has this to start if  she feels like she may need it in the future.  An After Visit Summary was printed and given to the patient.  FOLLOW UP: No follow-ups on file.  Signed:  Crissie Sickles, MD           10/24/2021

## 2021-11-04 ENCOUNTER — Other Ambulatory Visit: Payer: Self-pay | Admitting: Family Medicine

## 2021-11-20 ENCOUNTER — Ambulatory Visit (HOSPITAL_BASED_OUTPATIENT_CLINIC_OR_DEPARTMENT_OTHER): Payer: Medicare PPO | Admitting: Cardiovascular Disease

## 2021-11-20 ENCOUNTER — Other Ambulatory Visit: Payer: Self-pay

## 2021-11-20 ENCOUNTER — Encounter (HOSPITAL_BASED_OUTPATIENT_CLINIC_OR_DEPARTMENT_OTHER): Payer: Self-pay | Admitting: Cardiovascular Disease

## 2021-11-20 DIAGNOSIS — K219 Gastro-esophageal reflux disease without esophagitis: Secondary | ICD-10-CM | POA: Diagnosis not present

## 2021-11-20 DIAGNOSIS — E7800 Pure hypercholesterolemia, unspecified: Secondary | ICD-10-CM | POA: Diagnosis not present

## 2021-11-20 DIAGNOSIS — I951 Orthostatic hypotension: Secondary | ICD-10-CM | POA: Diagnosis not present

## 2021-11-20 HISTORY — DX: Orthostatic hypotension: I95.1

## 2021-11-20 NOTE — Assessment & Plan Note (Signed)
She notes orthostatic hypotension and denies syncope.  She has been started on florinef and midodrine with improvement in symptoms.  She has also been using compression socks sometimes. She has been trying to stay hydrated and aims for at least 64 oz but struggles to do that.  Lately she thinks that she has been closer to 40 oz.  Symptoms are overall well-controlled.  She will work on increasing her fluid intake back to 64 ounces daily.  Hopefully this will help bring her blood pressures up and we can discontinue her Florinef, which is most likely causing her dry mouth.  She will also work on trying to add a little extra salt to her foods.  Continue with compression socks, fludrocortisone, and midodrine.  ACTH, cortisol, and TSH were all within normal limits.  She does not have any other significant neurologic findings.

## 2021-11-20 NOTE — Assessment & Plan Note (Signed)
Continue simvastatin. 

## 2021-11-20 NOTE — Progress Notes (Signed)
Cardiology Office Note:    Date:  11/20/2021   ID:  Barbara Munoz, DOB 1951-12-01, MRN [id number]  PCP:  Tammi Sou, MD  Cardiologist:  None    Referring MD: Tammi Sou, MD   No chief complaint on file.   History of Present Illness:    Barbara Munoz is a 70 y.o. female with a hx of hypotension, hyperlipidemia, GERD, anxiety, and obesity here today for evaluation of orthostatic hypotension at the request of Dr. Anitra Lauth.  She last saw Dr. Anitra Lauth 10/24/2021 and complained of continued orthostatic hypotension with associated fatigue. Her condition was improved with florinef and midodrine. She was referred to cardiology. Cortisol and ACTH were within normal limits.   Today, she is doing well. She believes her low blood pressure started 07/2021 and gradually worsened until she started feeling symptoms. She started feeling bad and not herself constantly, weakness, lightheadedness and unsure of her balance. She feels the low blood pressure after first standing up. She has a history of depression and felt similar episodes of chest pain in the past. She also has a history of anxiety attacks. However, these current episodes are dissimilar to her previous anxiety attacks. She endorses heart burn and wonders if the chest pain is related to her acid reflux. Reportedly, she has had inflamed spots on her chest in the past. She endorses insomnia and fell asleep at 2AM last night. She reports dry mouth which she attributes to her medication. However, recently, it is difficult for her to stay hydrated. She reports her diet is otherwise good and she limits adding salt. She is not able to formally exercise because of her low blood pressure symptoms but she stays active with house work. She will wear compression socks if she knows she will be standing for long periods of time. Her paternal grandfather passed away of a stroke at 1 y/o and her paternal grandmother passed away at 69 y/o due to old age and  heart weakness. However, her mother is 98 y/o and is still alive but was diagnosed with Afib 15 years ago. She is a retired Presenter, broadcasting. She is a caregiver for her husband and mother, cares for her 4 grandchildren, and helps keep books at her church. She denies any palpitations, headaches, syncope, orthopnea, or PND.   Past Medical History:  Diagnosis Date   Anxiety and depression 09/08/2011   Female bladder prolapse    GERD (gastroesophageal reflux disease) 09/05/2011   Improved signif with PPI   History of adenomatous polyp of colon 08/2017   Recall 3-5 yrs   History of Clostridium difficile infection 2007   colonic changes c/w with this dx seen on colonoscopy 2007---sx's responded to flagyl.   History of cyst of breast    multiple drained during menopause   Hyperlipidemia    Hypotension 2022   ?idiopathic   IFG (impaired fasting glucose) 06/2018   Fastings 105-115 range when pt checked with husband's glucometer.  Fasting gluc here was 98 and A1c 5.6% 07/01/18.   Insomnia 09/05/2011   Migraine    worst during menopause   Orthostatic hypotension 11/20/2021   Overweight (BMI 25.0-29.9) 09/05/2011   Seasonal allergic rhinitis    Subclinical hypothyroidism    Vertigo, benign positional 09/08/2011    Past Surgical History:  Procedure Laterality Date   CARDIOVASCULAR STRESS TEST  02/15/15   Exercise myocard perfusion testing: Normal EF, normal wall motion, normal perfusion.  Poor exercise capacity (4 min).  COLONOSCOPY  07/2006; 08/25/17   2007: pseudomembranous colitis, o/w normal. (Dr. Ardis Hughs).  08/2017: adenomatous polyp--recall 3-5 yrs   COSMETIC SURGERY     DEXA  06/19/09; 01/19/2019   Normal 2010 and 2020.   POSTERIOR REPAIR  2012   TONSILLECTOMY  82   Transvaginal ultrasound  10/2016   Normal (done for LLQ pain by Dr. Benay Pike).   TUBAL LIGATION  1983   vaginal hernia repair  6-12   rectocele repair    Current Medications: Current Meds   Medication Sig   ALPRAZolam (XANAX) 0.25 MG tablet Take 1 tablet (0.25 mg total) by mouth 2 (two) times daily as needed.   aspirin 81 MG tablet Take 81 mg by mouth daily.   Calcium Carbonate-Vitamin D 600-400 MG-UNIT tablet Take 1 tablet by mouth daily.   Cyanocobalamin (VITAMIN B12) 500 MCG TABS    Ferrous Sulfate (IRON) 325 (65 Fe) MG TABS Take by mouth daily.    fludrocortisone (FLORINEF) 0.1 MG tablet Take 1 tablet (0.1 mg total) by mouth daily.   loratadine (CLARITIN) 10 MG tablet Take 10 mg by mouth daily as needed. Spring and fall   Magnesium 250 MG TABS Take 250 mg by mouth daily.   Melatonin 10 MG TABS Take 5 mg by mouth.   meloxicam (MOBIC) 7.5 MG tablet TAKE 1 TO 2 TABLETS EVERY DAY AS NEEDED FOR MUSCULOSKELETAL PAIN   midodrine (PROAMATINE) 5 MG tablet Take 1 tablet (5 mg total) by mouth 3 (three) times daily with meals.   pantoprazole (PROTONIX) 40 MG tablet Take 1 tablet (40 mg total) by mouth daily as needed.   sertraline (ZOLOFT) 100 MG tablet 2 tabs po qhs   simvastatin (ZOCOR) 40 MG tablet TAKE 1 TABLET BY MOUTH DAILY AT 6 PM.     Allergies:   Patient has no known allergies.   Social History   Socioeconomic History   Marital status: Married    Spouse name: Not on file   Number of children: Not on file   Years of education: Not on file   Highest education level: Not on file  Occupational History   Not on file  Tobacco Use   Smoking status: Never   Smokeless tobacco: Never   Tobacco comments:    smoked for about 6 months  Vaping Use   Vaping Use: Never used  Substance and Sexual Activity   Alcohol use: Yes    Comment: moderate use   Drug use: No   Sexual activity: Yes    Birth control/protection: Post-menopausal  Other Topics Concern   Not on file  Social History Narrative   Married.   Nonsmoker.   Social Determinants of Health   Financial Resource Strain: Low Risk    Difficulty of Paying Living Expenses: Not hard at all  Food Insecurity: No Food  Insecurity   Worried About Charity fundraiser in the Last Year: Never true   [place] in the Last Year: Never true  Transportation Needs: No Transportation Needs   Lack of Transportation (Medical): No   Lack of Transportation (Non-Medical): No  Physical Activity: Insufficiently Active   Days of Exercise per Week: 3 days   Minutes of Exercise per Session: 30 min  Stress: No Stress Concern Present   Feeling of Stress : Not at all  Social Connections: Moderately Integrated   Frequency of Communication with Friends and Family: More than three times a week   Frequency of Social Gatherings with Friends and  Family: More than three times a week   Attends Religious Services: More than 4 times per year   Active Member of Clubs or Organizations: No   Attends Archivist Meetings: Never   Marital Status: Married     Family History: The patient's family history includes Allergies in her brother and daughter; Aneurysm in her father; Atrial fibrillation in her mother; Breast cancer (age of onset: 72) in her sister; Breast cancer (age of onset: 2) in her maternal grandmother; Cancer in her father and sister; Diabetes in her daughter, sister, and sister; Diabetes (age of onset: 71) in her mother; Heart attack in her maternal grandfather; Heart disease in her paternal grandmother; Hyperlipidemia in her brother, sister, and sister; Hypertension in her sister and sister; Kidney disease in her paternal grandfather; Other in her sister; Pernicious anemia in her father and paternal grandmother; Stroke in her paternal grandfather; Thyroid disease in her sister. There is no history of Colon cancer, Esophageal cancer, Stomach cancer, or Rectal cancer.  ROS:   Please see the history of present illness.   (+) Chest pain (+) Lightheadedness (+) Acid reflux (+) Insomnia (+) Dry mouth All other systems reviewed and negative.   EKGs/Labs/Other Studies Reviewed:    The following studies were  reviewed today: Myocardial Imaging 02/16/15 Showed NO ABNORMALITIES of her heart  EKG:   11/20/21: Sinus rhythm, rate 68 bpm  Recent Labs: 09/17/2021: ALT 41; BUN 14; Creatinine, Ser 0.72; Hemoglobin 14.5; Platelets 156.0; Potassium 4.1; Sodium 140; TSH 1.67   Recent Lipid Panel    Component Value Date/Time   CHOL 174 08/08/2021 1023   TRIG 170.0 (H) 08/08/2021 1023   HDL 50.40 08/08/2021 1023   CHOLHDL 3 08/08/2021 1023   VLDL 34.0 08/08/2021 1023   LDLCALC 89 08/08/2021 1023   LDLDIRECT 110.6 10/26/2013 0903    CHA2DS2-VASc Score =   [ ] .  Therefore, the patient's annual risk of stroke is   %.        Physical Exam:    VS:  BP 112/62   Pulse 68   Ht 5\' 3"  (1.6 m)   Wt 156 lb (70.8 kg)   BMI 27.63 kg/m  , BMI Body mass index is 27.63 kg/m. GENERAL:  Well appearing HEENT: Pupils equal round and reactive, fundi not visualized, oral mucosa unremarkable NECK:  No jugular venous distention, waveform within normal limits, carotid upstroke brisk and symmetric, no bruits, no thyromegaly LUNGS:  Clear to auscultation bilaterally HEART:  RRR.  PMI not displaced or sustained,S1 and S2 within normal limits, no S3, no S4, no clicks, no rubs, no murmurs ABD:  Flat, positive bowel sounds normal in frequency in pitch, no bruits, no rebound, no guarding, no midline pulsatile mass, no hepatomegaly, no splenomegaly EXT:  2 plus pulses throughout, no edema, no cyanosis no clubbing SKIN:  No rashes no nodules NEURO:  Cranial nerves II through XII grossly intact, motor grossly intact throughout PSYCH:  Cognitively intact, oriented to person place and time  ASSESSMENT:    1. Gastroesophageal reflux disease, unspecified whether esophagitis present   2. Orthostatic hypotension   3. Pure hypercholesterolemia    PLAN:    GERD (gastroesophageal reflux disease) She has struggled with GERD at times.   Orthostatic hypotension She notes orthostatic hypotension and denies syncope.  She has  been started on florinef and midodrine with improvement in symptoms.  She has also been using compression socks sometimes. She has been trying to stay hydrated and aims for  at least 64 oz but struggles to do that.  Lately she thinks that she has been closer to 40 oz.  Symptoms are overall well-controlled.  She will work on increasing her fluid intake back to 64 ounces daily.  Hopefully this will help bring her blood pressures up and we can discontinue her Florinef, which is most likely causing her dry mouth.  She will also work on trying to add a little extra salt to her foods.  Continue with compression socks, fludrocortisone, and midodrine.  ACTH, cortisol, and TSH were all within normal limits.  She does not have any other significant neurologic findings.  Hyperlipidemia Continue simvastatin.  In order of problems listed above:      Medication Adjustments/Labs and Tests Ordered: Current medicines are reviewed at length with the patient today.  Concerns regarding medicines are outlined above.  Orders Placed This Encounter  Procedures   ECHOCARDIOGRAM COMPLETE    No orders of the defined types were placed in this encounter.  Disposition: FU with Avel Ogawa C. Oval Linsey, MD, [facility] in 3 months  I,Mykaella Javier,acting as a scribe for Skeet Latch, MD.,have documented all relevant documentation on the behalf of Skeet Latch, MD,as directed by  Skeet Latch, MD while in the presence of Skeet Latch, MD.  I, [place] Oval Linsey, MD have reviewed all documentation for this visit.  The documentation of the exam, diagnosis, procedures, and orders on 11/20/2021 are all accurate and complete.   Signed, Skeet Latch, MD  11/20/2021 1:13 PM    [facility]

## 2021-11-20 NOTE — Assessment & Plan Note (Signed)
She has struggled with GERD at times.

## 2021-11-20 NOTE — Patient Instructions (Signed)
Medication Instructions:  Your physician recommends that you continue on your current medications as directed. Please refer to the Current Medication list given to you today.   *If you need a refill on your cardiac medications before your next appointment, please call your pharmacy*  Lab Work: NONE   Testing/Procedures: Your physician has requested that you have an echocardiogram. Echocardiography is a painless test that uses sound waves to create images of your heart. It provides your doctor with information about the size and shape of your heart and how well your heart's chambers and valves are working. This procedure takes approximately one hour. There are no restrictions for this procedure.  Follow-Up: At [facility], you and your health needs are our priority.  As part of our continuing mission to provide you with exceptional heart care, we have created designated Provider Care Teams.  These Care Teams include your primary Cardiologist (physician) and Advanced Practice Providers (APPs -  Physician Assistants and Nurse Practitioners) who all work together to provide you with the care you need, when you need it.  We recommend signing up for the patient portal called "MyChart".  Sign up information is provided on this After Visit Summary.  MyChart is used to connect with patients for Virtual Visits (Telemedicine).  Patients are able to view lab/test results, encounter notes, upcoming appointments, etc.  Non-urgent messages can be sent to your provider as well.   To learn more about what you can do with MyChart, go to [email or web address].    Your next appointment:   3 month(s)  The format for your next appointment:   In Person  Provider:   Skeet Latch, MD   Other Instructions INCREASE YOUR FLUID BACK TO 64 OUNCES A DAY

## 2021-11-20 NOTE — Addendum Note (Signed)
Addended by: Gerald Stabs on: 11/20/2021 05:02 PM   Modules accepted: Orders

## 2021-12-04 DIAGNOSIS — Z23 Encounter for immunization: Secondary | ICD-10-CM | POA: Diagnosis not present

## 2021-12-05 ENCOUNTER — Other Ambulatory Visit (HOSPITAL_BASED_OUTPATIENT_CLINIC_OR_DEPARTMENT_OTHER): Payer: Medicare PPO

## 2021-12-09 ENCOUNTER — Encounter: Payer: Self-pay | Admitting: Family Medicine

## 2021-12-10 NOTE — Telephone Encounter (Signed)
Nothing further needed at this time. 

## 2022-01-06 ENCOUNTER — Ambulatory Visit (INDEPENDENT_AMBULATORY_CARE_PROVIDER_SITE_OTHER): Payer: Medicare PPO

## 2022-01-06 ENCOUNTER — Other Ambulatory Visit: Payer: Self-pay

## 2022-01-06 DIAGNOSIS — I951 Orthostatic hypotension: Secondary | ICD-10-CM | POA: Diagnosis not present

## 2022-01-06 DIAGNOSIS — R5383 Other fatigue: Secondary | ICD-10-CM | POA: Diagnosis not present

## 2022-01-06 HISTORY — PX: TRANSTHORACIC ECHOCARDIOGRAM: SHX275

## 2022-01-06 LAB — ECHOCARDIOGRAM COMPLETE
AR max vel: 2.73 cm2
AV Area VTI: 2.53 cm2
AV Area mean vel: 2.51 cm2
AV Mean grad: 4 mmHg
AV Peak grad: 6.8 mmHg
Ao pk vel: 1.3 m/s
Area-P 1/2: 3.85 cm2
Calc EF: 58.6 %
S' Lateral: 3.07 cm
Single Plane A2C EF: 63.9 %
Single Plane A4C EF: 55.1 %

## 2022-01-16 ENCOUNTER — Encounter: Payer: Self-pay | Admitting: Family Medicine

## 2022-02-01 ENCOUNTER — Other Ambulatory Visit: Payer: Self-pay | Admitting: Family Medicine

## 2022-02-06 ENCOUNTER — Other Ambulatory Visit: Payer: Self-pay | Admitting: Family Medicine

## 2022-02-07 ENCOUNTER — Other Ambulatory Visit: Payer: Self-pay

## 2022-02-07 NOTE — Telephone Encounter (Signed)
Pt has appt on 2/20

## 2022-02-09 ENCOUNTER — Other Ambulatory Visit: Payer: Self-pay | Admitting: Family Medicine

## 2022-02-10 ENCOUNTER — Encounter: Payer: Self-pay | Admitting: Family Medicine

## 2022-02-10 ENCOUNTER — Ambulatory Visit: Payer: Medicare PPO | Admitting: Family Medicine

## 2022-02-10 ENCOUNTER — Other Ambulatory Visit: Payer: Self-pay

## 2022-02-10 VITALS — BP 104/65 | HR 71 | Temp 97.8°F | Ht 63.0 in | Wt 154.4 lb

## 2022-02-10 DIAGNOSIS — E7800 Pure hypercholesterolemia, unspecified: Secondary | ICD-10-CM | POA: Diagnosis not present

## 2022-02-10 DIAGNOSIS — Z23 Encounter for immunization: Secondary | ICD-10-CM

## 2022-02-10 DIAGNOSIS — E538 Deficiency of other specified B group vitamins: Secondary | ICD-10-CM | POA: Diagnosis not present

## 2022-02-10 DIAGNOSIS — Z0000 Encounter for general adult medical examination without abnormal findings: Secondary | ICD-10-CM

## 2022-02-10 DIAGNOSIS — I950 Idiopathic hypotension: Secondary | ICD-10-CM

## 2022-02-10 LAB — CBC WITH DIFFERENTIAL/PLATELET
Basophils Absolute: 0 10*3/uL (ref 0.0–0.1)
Basophils Relative: 0.6 % (ref 0.0–3.0)
Eosinophils Absolute: 0.1 10*3/uL (ref 0.0–0.7)
Eosinophils Relative: 1.3 % (ref 0.0–5.0)
HCT: 41.3 % (ref 36.0–46.0)
Hemoglobin: 13.6 g/dL (ref 12.0–15.0)
Lymphocytes Relative: 28.3 % (ref 12.0–46.0)
Lymphs Abs: 2.4 10*3/uL (ref 0.7–4.0)
MCHC: 33.1 g/dL (ref 30.0–36.0)
MCV: 92.1 fl (ref 78.0–100.0)
Monocytes Absolute: 0.7 10*3/uL (ref 0.1–1.0)
Monocytes Relative: 8.2 % (ref 3.0–12.0)
Neutro Abs: 5.2 10*3/uL (ref 1.4–7.7)
Neutrophils Relative %: 61.6 % (ref 43.0–77.0)
Platelets: 137 10*3/uL — ABNORMAL LOW (ref 150.0–400.0)
RBC: 4.48 Mil/uL (ref 3.87–5.11)
RDW: 13.3 % (ref 11.5–15.5)
WBC: 8.4 10*3/uL (ref 4.0–10.5)

## 2022-02-10 LAB — LIPID PANEL
Cholesterol: 188 mg/dL (ref 0–200)
HDL: 48.9 mg/dL (ref >39.00)
LDL Cholesterol: 105 mg/dL — ABNORMAL HIGH (ref 0–99)
NonHDL: 138.91
Total CHOL/HDL Ratio: 4
Triglycerides: 170 mg/dL — ABNORMAL HIGH (ref 0.0–149.0)
VLDL: 34 mg/dL (ref 0.0–40.0)

## 2022-02-10 LAB — COMPREHENSIVE METABOLIC PANEL
ALT: 33 U/L (ref 0–35)
AST: 31 U/L (ref 0–37)
Albumin: 4.4 g/dL (ref 3.5–5.2)
Alkaline Phosphatase: 47 U/L (ref 39–117)
BUN: 18 mg/dL (ref 6–23)
CO2: 31 mEq/L (ref 19–32)
Calcium: 9.2 mg/dL (ref 8.4–10.5)
Chloride: 105 mEq/L (ref 96–112)
Creatinine, Ser: 0.69 mg/dL (ref 0.40–1.20)
GFR: 88.05 mL/min (ref >60.00)
Glucose, Bld: 86 mg/dL (ref 70–99)
Potassium: 4 mEq/L (ref 3.5–5.1)
Sodium: 141 mEq/L (ref 135–145)
Total Bilirubin: 0.6 mg/dL (ref 0.2–1.2)
Total Protein: 6.9 g/dL (ref 6.0–8.3)

## 2022-02-10 LAB — VITAMIN B12: Vitamin B-12: 1077 pg/mL — ABNORMAL HIGH (ref 211–911)

## 2022-02-10 LAB — TSH: TSH: 2.37 u[IU]/mL (ref 0.35–5.50)

## 2022-02-10 MED ORDER — ZOSTER VAC RECOMB ADJUVANTED 50 MCG/0.5ML IM SUSR: 0.5000 mL | Freq: Once | INTRAMUSCULAR | 1 refills | Status: AC

## 2022-02-10 MED ORDER — ALPRAZOLAM 0.25 MG PO TABS: 0.2500 mg | ORAL_TABLET | Freq: Two times a day (BID) | ORAL | 1 refills | Status: DC | PRN

## 2022-02-10 MED ORDER — SERTRALINE HCL 100 MG PO TABS: ORAL_TABLET | ORAL | 3 refills | Status: DC

## 2022-02-10 NOTE — Telephone Encounter (Signed)
Pt has appt 2/20

## 2022-02-10 NOTE — Progress Notes (Signed)
Office Note 02/10/2022  CC:  Chief Complaint  Patient presents with   Annual Exam    Pt is fasting   HPI:  Patient is a 71 y.o. female who is here accompanied by her husband Jonni Sanger for annual health maintenance exam and f/u idiopathic hypotension, anxiety, HLD.  Doing pretty good overall.  Anxiety levels up more lately, mainly associated with having to do more in her caregiving for her husband.  She fights negativity.  Taking Xanax and melatonin nightly in order to get decent sleep  Energy level still waxing and waning.  When she checks her blood pressures consistently 120 over 70s.   Past Medical History:  Diagnosis Date   Anxiety and depression 09/08/2011   Female bladder prolapse    GERD (gastroesophageal reflux disease) 09/05/2011   Improved signif with PPI   History of adenomatous polyp of colon 08/2017   Recall 3-5 yrs   History of Clostridium difficile infection 2007   colonic changes c/w with this dx seen on colonoscopy 2007---sx's responded to flagyl.   History of cyst of breast    multiple drained during menopause   Hyperlipidemia    Hypotension 2022   ?idiopathic   IFG (impaired fasting glucose) 06/2018   Fastings 105-115 range when pt checked with husband's glucometer.  Fasting gluc here was 98 and A1c 5.6% 07/01/18.   Insomnia 09/05/2011   Migraine    worst during menopause   Orthostatic hypotension 11/20/2021   Overweight (BMI 25.0-29.9) 09/05/2011   Seasonal allergic rhinitis    Subclinical hypothyroidism    Vertigo, benign positional 09/08/2011    Past Surgical History:  Procedure Laterality Date   CARDIOVASCULAR STRESS TEST  02/15/2015   Exercise myocard perfusion testing: Normal EF, normal wall motion, normal perfusion.  Poor exercise capacity (4 min).   COLONOSCOPY  07/2006; 08/25/17   2007: pseudomembranous colitis, o/w normal. (Dr. Ardis Hughs).  08/2017: adenomatous polyp--recall 3-5 yrs   COSMETIC SURGERY     DEXA  06/19/09; 01/19/2019   Normal 2010  and 2020.   POSTERIOR REPAIR  2012   TONSILLECTOMY  1982   TRANSTHORACIC ECHOCARDIOGRAM  01/06/2022   NORMAL   Transvaginal ultrasound  10/2016   Normal (done for LLQ pain by Dr. Benay Pike).   TUBAL LIGATION  1983   vaginal hernia repair  05/2011   rectocele repair    Family History  Problem Relation Age of Onset   Diabetes Mother 64       type 2   Atrial fibrillation Mother    Aneurysm Father        aortic   Cancer Father        prostate and lung/ smoker   Pernicious anemia Father    Hyperlipidemia Sister    Hypertension Sister    Diabetes Sister        type 2   Other Sister        degenerative disc disease   Thyroid disease Sister    Cancer Sister        breast  cancer s/p dbl mastectomy doing well   Hypertension Sister    Hyperlipidemia Sister    Diabetes Sister        type 2   Breast cancer Sister 53   Hyperlipidemia Brother    Allergies Brother    Breast cancer Maternal Grandmother 37   Heart attack Maternal Grandfather    Heart disease Paternal Grandmother        CHF   Pernicious anemia Paternal Grandmother  Kidney disease Paternal Grandfather    Stroke Paternal Grandfather    Diabetes Daughter        type 1   Allergies Daughter    Colon cancer Neg Hx    Esophageal cancer Neg Hx    Stomach cancer Neg Hx    Rectal cancer Neg Hx     Social History   Socioeconomic History   Marital status: Married    Spouse name: Not on file   Number of children: Not on file   Years of education: Not on file   Highest education level: Not on file  Occupational History   Not on file  Tobacco Use   Smoking status: Never   Smokeless tobacco: Never   Tobacco comments:    smoked for about 6 months  Vaping Use   Vaping Use: Never used  Substance and Sexual Activity   Alcohol use: Yes    Comment: moderate use   Drug use: No   Sexual activity: Yes    Birth control/protection: Post-menopausal  Other Topics Concern   Not on file  Social History Narrative    Married.   Nonsmoker.   Social Determinants of Health   Financial Resource Strain: Low Risk    Difficulty of Paying Living Expenses: Not hard at all  Food Insecurity: No Food Insecurity   Worried About Charity fundraiser in the Last Year: Never true   [place] in the Last Year: Never true  Transportation Needs: No Transportation Needs   Lack of Transportation (Medical): No   Lack of Transportation (Non-Medical): No  Physical Activity: Insufficiently Active   Days of Exercise per Week: 3 days   Minutes of Exercise per Session: 30 min  Stress: No Stress Concern Present   Feeling of Stress : Not at all  Social Connections: Moderately Integrated   Frequency of Communication with Friends and Family: More than three times a week   Frequency of Social Gatherings with Friends and Family: More than three times a week   Attends Religious Services: More than 4 times per year   Active Member of [employer] or Organizations: No   Attends Archivist Meetings: Never   Marital Status: Married  Human resources officer Violence: Not At Risk   Fear of Current or Ex-Partner: No   Emotionally Abused: No   Physically Abused: No   Sexually Abused: No    Outpatient Medications Prior to Visit  Medication Sig Dispense Refill   ALPRAZolam (XANAX) 0.25 MG tablet Take 1 tablet (0.25 mg total) by mouth 2 (two) times daily as needed. 90 tablet 1   aspirin 81 MG tablet Take 81 mg by mouth daily.     Calcium Carbonate-Vitamin D 600-400 MG-UNIT tablet Take 1 tablet by mouth daily.     Cyanocobalamin (VITAMIN B12) 500 MCG TABS      Ferrous Sulfate (IRON) 325 (65 Fe) MG TABS Take by mouth daily.      fludrocortisone (FLORINEF) 0.1 MG tablet Take 1 tablet (0.1 mg total) by mouth daily. 90 tablet 3   Magnesium 250 MG TABS Take 250 mg by mouth daily.     Melatonin 10 MG TABS Take 5 mg by mouth.     meloxicam (MOBIC) 7.5 MG tablet TAKE 1 TO 2 TABLETS EVERY DAY AS NEEDED FOR MUSCULOSKELETAL PAIN 60 tablet 2    midodrine (PROAMATINE) 5 MG tablet Take 1 tablet (5 mg total) by mouth 3 (three) times daily with meals. 270 tablet 3  pantoprazole (PROTONIX) 40 MG tablet Take 1 tablet (40 mg total) by mouth daily as needed. 90 tablet 3   sertraline (ZOLOFT) 100 MG tablet 2 tabs po qhs 60 tablet 11   simvastatin (ZOCOR) 40 MG tablet TAKE 1 TABLET BY MOUTH DAILY AT 6 PM. 90 tablet 1   amphetamine-dextroamphetamine (ADDERALL XR) 10 MG 24 hr capsule Take 1 capsule (10 mg total) by mouth daily. (Patient not taking: Reported on 10/24/2021) 30 capsule 0   loratadine (CLARITIN) 10 MG tablet Take 10 mg by mouth daily as needed. Spring and fall (Patient not taking: Reported on 02/10/2022)     Multiple Vitamin (MULTIVITAMIN) tablet Take 1 tablet by mouth daily. (Patient not taking: Reported on 11/20/2021)     Omega-3 Fatty Acids (FISH OIL PO) Take by mouth daily. (Patient not taking: Reported on 11/20/2021)     No facility-administered medications prior to visit.    No Known Allergies  ROS Review of Systems  Constitutional:  Negative for appetite change, chills, fatigue and fever.  HENT:  Negative for congestion, dental problem, ear pain and sore throat.   Eyes:  Negative for discharge, redness and visual disturbance.  Respiratory:  Negative for cough, chest tightness, shortness of breath and wheezing.   Cardiovascular:  Negative for chest pain, palpitations and leg swelling.  Gastrointestinal:  Negative for abdominal pain, blood in stool, diarrhea, nausea and vomiting.  Genitourinary:  Negative for difficulty urinating, dysuria, flank pain, frequency, hematuria and urgency.  Musculoskeletal:  Negative for arthralgias, back pain, joint swelling, myalgias and neck stiffness.  Skin:  Negative for pallor and rash.  Neurological:  Negative for dizziness, speech difficulty, weakness and headaches.  Hematological:  Negative for adenopathy. Does not bruise/bleed easily.  Psychiatric/Behavioral:  Negative for confusion and  sleep disturbance. The patient is not nervous/anxious.    PE; Vitals with BMI 02/10/2022 11/20/2021 10/24/2021  Height 5\' 3"  5\' 3"  5\' 3"   Weight 154 lbs 6 oz 156 lbs 157 lbs  BMI 27.36 16.10 96.04  Systolic 540 981 191  Diastolic 65 62 66  Pulse 71 68 68   Gen: Alert, well appearing.  Patient is oriented to person, place, time, and situation. AFFECT: pleasant, lucid thought and speech. ENT: Ears: EACs clear, normal epithelium.  TMs with good light reflex and landmarks bilaterally.  Eyes: no injection, icteris, swelling, or exudate.  EOMI, PERRLA. Nose: no drainage or turbinate edema/swelling.  No injection or focal lesion.  Mouth: lips without lesion/swelling.  Oral mucosa pink and moist.  Dentition intact and without obvious caries or gingival swelling.  Oropharynx without erythema, exudate, or swelling.  Neck: supple/nontender.  No LAD, mass, or TM.  Carotid pulses 2+ bilaterally, without bruits. CV: RRR, no m/r/g.   LUNGS: CTA bilat, nonlabored resps, good aeration in all lung fields. ABD: soft, NT, ND, BS normal.  No hepatospenomegaly or mass.  No bruits. EXT: no clubbing, cyanosis, or edema.  Musculoskeletal: no joint swelling, erythema, warmth, or tenderness.  ROM of all joints intact. Skin - no sores or suspicious lesions or rashes or color changes  Pertinent labs:  Lab Results  Component Value Date   TSH 1.67 09/17/2021   Lab Results  Component Value Date   WBC 6.4 09/17/2021   HGB 14.5 09/17/2021   HCT 43.5 09/17/2021   MCV 93.1 09/17/2021   PLT 156.0 09/17/2021   Lab Results  Component Value Date   CREATININE 0.72 09/17/2021   BUN 14 09/17/2021   NA 140 09/17/2021  K 4.1 09/17/2021   CL 103 09/17/2021   CO2 26 09/17/2021   Lab Results  Component Value Date   ALT 41 (H) 09/17/2021   AST 36 09/17/2021   ALKPHOS 50 09/17/2021   BILITOT 0.5 09/17/2021   Lab Results  Component Value Date   CHOL 174 08/08/2021   Lab Results  Component Value Date   HDL  50.40 08/08/2021   Lab Results  Component Value Date   LDLCALC 89 08/08/2021   Lab Results  Component Value Date   TRIG 170.0 (H) 08/08/2021   Lab Results  Component Value Date   CHOLHDL 3 08/08/2021   Lab Results  Component Value Date   HGBA1C 5.4 11/08/2019   ASSESSMENT AND PLAN:   #1 anxiety and depression. Stable on sertraline 200 mg a day and alprazolam 0.25 bid. Has Adderall at home but has not tried it yet.  #2 hypotension.  Maintaining adequate blood pressure with midodrine 5 mg 3 times daily and Florinef 0.1 mg daily.  She is drinking approximately 60 ounces of fluid per day.  3.  Hyperlipidemia.  Tolerating simvastatin 40 mg a day. Lipid panel and hepatic panel today.  4. Health maintenance exam: Reviewed age and gender appropriate health maintenance issues (prudent diet, regular exercise, health risks of tobacco and excessive alcohol, use of seatbelts, fire alarms in home, use of sunscreen).  Also reviewed age and gender appropriate health screening as well as vaccine recommendations. Vaccines:  Shingrix->rx to pharmacy.  O/w UTD. Labs: fasting HP Cervical ca screening: per GYN MD. Breast ca screening: rpt due around June this year via Dr. Cletis Media, [place]. Colon ca screening: recall around 08/2022 Osteoporosis screening: has been done in the past by her GYN provider, most recent 2020-->has GYN f/u set for next week.  An After Visit Summary was printed and given to the patient.  FOLLOW UP:  No follow-ups on file.  Signed:  Crissie Sickles, MD           02/10/2022

## 2022-02-11 ENCOUNTER — Telehealth: Payer: Self-pay

## 2022-02-11 DIAGNOSIS — D696 Thrombocytopenia, unspecified: Secondary | ICD-10-CM

## 2022-02-11 NOTE — Telephone Encounter (Signed)
-----   Message from Barbara Sou, MD sent at 02/10/2022  6:44 PM EST ----- LDL cholesterol is 105, which is a little bit above her goal of 100 or less.  I do not recommend any changes in her medication at this time. Platelets very mildly decreased.  This is not alarming, though.  Likely transient. Would simply like to repeat this in 1 month, nonfasting CBC with differential, diagnosis thrombocytopenia. All remaining labs normal.

## 2022-02-21 ENCOUNTER — Ambulatory Visit (HOSPITAL_BASED_OUTPATIENT_CLINIC_OR_DEPARTMENT_OTHER): Payer: Medicare PPO | Admitting: Cardiovascular Disease

## 2022-02-21 ENCOUNTER — Encounter (HOSPITAL_BASED_OUTPATIENT_CLINIC_OR_DEPARTMENT_OTHER): Payer: Self-pay | Admitting: Cardiovascular Disease

## 2022-02-21 ENCOUNTER — Other Ambulatory Visit: Payer: Self-pay

## 2022-02-21 DIAGNOSIS — F32A Depression, unspecified: Secondary | ICD-10-CM | POA: Diagnosis not present

## 2022-02-21 DIAGNOSIS — I951 Orthostatic hypotension: Secondary | ICD-10-CM | POA: Diagnosis not present

## 2022-02-21 DIAGNOSIS — F419 Anxiety disorder, unspecified: Secondary | ICD-10-CM

## 2022-02-21 NOTE — Assessment & Plan Note (Signed)
Her anxiety is not well controlled right now.  She is using her Xanax daily.  She is going to reestablish a relationship with her therapist.  Continue Zoloft.  She is also going to work on reducing her stressors by getting more help with her husband. ?

## 2022-02-21 NOTE — Progress Notes (Signed)
Cardiology Office Note:    Date:  02/21/2022   ID:  Barbara Munoz, DOB [birth year], MRN [id number]  PCP:  Tammi Sou, MD  Cardiologist:  None    Referring MD: Tammi Sou, MD   No chief complaint on file.    History of Present Illness:    Barbara Munoz is a 71 y.o. female with a hx of hypotension, hyperlipidemia, GERD, anxiety, and obesity here for follow-up.  She was first seen 10/2021 for evaluation of orthostatic hypotension.  She last saw Dr. Anitra Lauth 10/24/2021 and complained of continued orthostatic hypotension with associated fatigue. Her condition was improved with florinef and midodrine. She was referred to cardiology. Cortisol and ACTH were within normal limits. She believes her low blood pressure started 07/2021 and gradually worsened until she started feeling symptoms. She started feeling bad and not herself constantly, weakness, lightheadedness and unsure of her balance. She feels the low blood pressure after first standing up. She has a history of depression and felt similar episodes of chest pain in the past. She also has a history of anxiety attacks. However, these episodes were dissimilar to her previous anxiety attacks. At her appointment she was doing better.  She was encouraged to continue the Florinef and midodrine as well as wear compression socks and increase her fluid intake.  When she saw her PCP 01/2021 her blood pressure was averaging in the 120s over 70s.  She is a retired Presenter, broadcasting. She is a caregiver for her husband and mother, cares for her 4 grandchildren, and helps keep books at her church.  Lately she has been feeling mostly well.  She is feeling a little anxious lately.  Yesterday she forgot to take her medication after her morning does and felt well through the day.  She often only takes midodrine twice per day.  Her BP has been mostly in the 120/70s.  She hasn't been getting formal exercise but is very active.  She has been  sanding all the wood in her home.  She has no chest pain or shortness of breath.  She is drinking at least 60 oz of fluid daily.  She struggles with dryness.  She has been stressed lately caring for her husband who has prostate cancer, diabetes and dementia.  She is planning to get more help at home.    Past Medical History:  Diagnosis Date   Anxiety and depression 09/08/2011   Female bladder prolapse    GERD (gastroesophageal reflux disease) 09/05/2011   Improved signif with PPI   History of adenomatous polyp of colon 08/2017   Recall 3-5 yrs   History of Clostridium difficile infection 2007   colonic changes c/w with this dx seen on colonoscopy 2007---sx's responded to flagyl.   History of cyst of breast    multiple drained during menopause   Hyperlipidemia    Hypotension 2022   ?idiopathic   IFG (impaired fasting glucose) 06/2018   Fastings 105-115 range when pt checked with husband's glucometer.  Fasting gluc here was 98 and A1c 5.6% 07/01/18.   Insomnia 09/05/2011   Migraine    worst during menopause   Orthostatic hypotension 11/20/2021   Overweight (BMI 25.0-29.9) 09/05/2011   Seasonal allergic rhinitis    Subclinical hypothyroidism    Vertigo, benign positional 09/08/2011    Past Surgical History:  Procedure Laterality Date   CARDIOVASCULAR STRESS TEST  02/15/2015   Exercise myocard perfusion testing: Normal EF, normal wall motion, normal perfusion.  Poor exercise capacity (4 min).   COLONOSCOPY  07/2006; 08/25/17   2007: pseudomembranous colitis, o/w normal. (Dr. Ardis Hughs).  08/2017: adenomatous polyp--recall 3-5 yrs   COSMETIC SURGERY     DEXA  06/19/09; 01/19/2019   Normal 2010 and 2020.   POSTERIOR REPAIR  2012   TONSILLECTOMY  1982   TRANSTHORACIC ECHOCARDIOGRAM  01/06/2022   NORMAL   Transvaginal ultrasound  10/2016   Normal (done for LLQ pain by Dr. Benay Pike).   TUBAL LIGATION  1983   vaginal hernia repair  05/2011   rectocele repair    Current  Medications: Current Meds  Medication Sig   ALPRAZolam (XANAX) 0.25 MG tablet Take 1 tablet (0.25 mg total) by mouth 2 (two) times daily as needed.   aspirin 81 MG tablet Take 81 mg by mouth daily.   Calcium Carbonate-Vitamin D 600-400 MG-UNIT tablet Take 1 tablet by mouth daily.   Cyanocobalamin (VITAMIN B12) 500 MCG TABS    Ferrous Sulfate (IRON) 325 (65 Fe) MG TABS Take by mouth daily.    fludrocortisone (FLORINEF) 0.1 MG tablet Take 1 tablet (0.1 mg total) by mouth daily.   loratadine (CLARITIN) 10 MG tablet Take 10 mg by mouth daily as needed. Spring and fall   Magnesium 250 MG TABS Take 250 mg by mouth daily.   Melatonin 5 MG CAPS Take 5 mg by mouth.   meloxicam (MOBIC) 7.5 MG tablet TAKE 1 TO 2 TABLETS EVERY DAY AS NEEDED FOR MUSCULOSKELETAL PAIN   midodrine (PROAMATINE) 5 MG tablet Take 5 mg by mouth 2 (two) times daily with a meal.   pantoprazole (PROTONIX) 40 MG tablet Take 1 tablet (40 mg total) by mouth daily as needed.   sertraline (ZOLOFT) 100 MG tablet 2 tabs po qhs   simvastatin (ZOCOR) 40 MG tablet TAKE 1 TABLET BY MOUTH DAILY AT 6 PM.   [DISCONTINUED] midodrine (PROAMATINE) 5 MG tablet Take 1 tablet (5 mg total) by mouth 3 (three) times daily with meals. (Patient taking differently: Take 5 mg by mouth 2 (two) times daily with a meal.)     Allergies:   Patient has no known allergies.   Social History   Socioeconomic History   Marital status: Married    Spouse name: Not on file   Number of children: Not on file   Years of education: Not on file   Highest education level: Not on file  Occupational History   Not on file  Tobacco Use   Smoking status: Never   Smokeless tobacco: Never   Tobacco comments:    smoked for about 6 months  Vaping Use   Vaping Use: Never used  Substance and Sexual Activity   Alcohol use: Yes    Comment: moderate use   Drug use: No   Sexual activity: Yes    Birth control/protection: Post-menopausal  Other Topics Concern   Not on file   Social History Narrative   Married.   Nonsmoker.   Social Determinants of Health   Financial Resource Strain: Low Risk    Difficulty of Paying Living Expenses: Not hard at all  Food Insecurity: No Food Insecurity   Worried About Charity fundraiser in the Last Year: Never true   [place] in the Last Year: Never true  Transportation Needs: No Transportation Needs   Lack of Transportation (Medical): No   Lack of Transportation (Non-Medical): No  Physical Activity: Insufficiently Active   Days of Exercise per Week: 3 days  Minutes of Exercise per Session: 30 min  Stress: No Stress Concern Present   Feeling of Stress : Not at all  Social Connections: Moderately Integrated   Frequency of Communication with Friends and Family: More than three times a week   Frequency of Social Gatherings with Friends and Family: More than three times a week   Attends Religious Services: More than 4 times per year   Active Member of [employer] or Organizations: No   Attends Archivist Meetings: Never   Marital Status: Married     Family History: The patient's family history includes Allergies in her brother and daughter; Aneurysm in her father; Atrial fibrillation in her mother; Breast cancer (age of onset: 67) in her sister; Breast cancer (age of onset: 40) in her maternal grandmother; Cancer in her father and sister; Diabetes in her daughter, sister, and sister; Diabetes (age of onset: 2) in her mother; Heart attack in her maternal grandfather; Heart disease in her paternal grandmother; Hyperlipidemia in her brother, sister, and sister; Hypertension in her sister and sister; Kidney disease in her paternal grandfather; Other in her sister; Pernicious anemia in her father and paternal grandmother; Stroke in her paternal grandfather; Thyroid disease in her sister. There is no history of Colon cancer, Esophageal cancer, Stomach cancer, or Rectal cancer.  ROS:   Please see the history of present  illness.   (+) Chest pain (+) Lightheadedness (+) Acid reflux (+) Insomnia (+) Dry mouth All other systems reviewed and negative.   EKGs/Labs/Other Studies Reviewed:    The following studies were reviewed today: Myocardial Imaging 02/16/15 Showed NO ABNORMALITIES of her heart  EKG:   11/20/21: Sinus rhythm, rate 68 bpm  Recent Labs: 02/10/2022: ALT 33; BUN 18; Creatinine, Ser 0.69; Hemoglobin 13.6; Platelets 137.0; Potassium 4.0; Sodium 141; TSH 2.37   Recent Lipid Panel    Component Value Date/Time   CHOL 188 02/10/2022 0951   TRIG 170.0 (H) 02/10/2022 0951   HDL 48.90 02/10/2022 0951   CHOLHDL 4 02/10/2022 0951   VLDL 34.0 02/10/2022 0951   LDLCALC 105 (H) 02/10/2022 0951   LDLDIRECT 110.6 10/26/2013 0903      Physical Exam:    VS:  BP 118/68    Pulse 82    Ht 5\' 3"  (1.6 m)    Wt 153 lb 12.8 oz (69.8 kg)    SpO2 97%    BMI 27.24 kg/m  , BMI Body mass index is 27.24 kg/m. GENERAL:  Well appearing HEENT: Pupils equal round and reactive, fundi not visualized, oral mucosa unremarkable NECK:  No jugular venous distention, waveform within normal limits, carotid upstroke brisk and symmetric, no bruits, no thyromegaly LUNGS:  Clear to auscultation bilaterally HEART:  RRR.  PMI not displaced or sustained,S1 and S2 within normal limits, no S3, no S4, no clicks, no rubs, no murmurs ABD:  Flat, positive bowel sounds normal in frequency in pitch, no bruits, no rebound, no guarding, no midline pulsatile mass, no hepatomegaly, no splenomegaly EXT:  2 plus pulses throughout, no edema, no cyanosis no clubbing SKIN:  No rashes no nodules NEURO:  Cranial nerves II through XII grossly intact, motor grossly intact throughout PSYCH:  Cognitively intact, oriented to person place and time   ASSESSMENT:    1. Orthostatic hypotension   2. Anxiety and depression     PLAN:    Orthostatic hypotension Symptoms are much more stable on her current regimen.  She often does not take her  afternoon dose of midodrine.  We will change the directions to twice daily.  Continue fludrocortisone.  She is struggling with dryness but is happy that her blood pressure is much more stable and does not want to make any changes.  She will continue to hydrate and is using a humidifier to help her symptoms.  Anxiety and depression Her anxiety is not well controlled right now.  She is using her Xanax daily.  She is going to reestablish a relationship with her therapist.  Continue Zoloft.  She is also going to work on reducing her stressors by getting more help with her husband.   In order of problems listed above:   Medication Adjustments/Labs and Tests Ordered: Current medicines are reviewed at length with the patient today.  Concerns regarding medicines are outlined above.  No orders of the defined types were placed in this encounter.   No orders of the defined types were placed in this encounter.   Disposition: FU with Sharene Krikorian C. Oval Linsey, MD, [facility] in 6 months    Signed, Skeet Latch, MD  02/21/2022 9:28 AM    [place]

## 2022-02-21 NOTE — Assessment & Plan Note (Signed)
Symptoms are much more stable on her current regimen.  She often does not take her afternoon dose of midodrine.  We will change the directions to twice daily.  Continue fludrocortisone.  She is struggling with dryness but is happy that her blood pressure is much more stable and does not want to make any changes.  She will continue to hydrate and is using a humidifier to help her symptoms. ?

## 2022-02-21 NOTE — Patient Instructions (Signed)
Medication Instructions:  ?CHANGE MIDODRINE TO TWICE A DAY  ? ?*If you need a refill on your cardiac medications before your next appointment, please call your pharmacy* ? ?Lab Work: ?NONE ? ?Testing/Procedures: ?NONE ? ?Follow-Up: ?At [facility], you and your health needs are our priority.  As part of our continuing mission to provide you with exceptional heart care, we have created designated Provider Care Teams.  These Care Teams include your primary Cardiologist (physician) and Advanced Practice Providers (APPs -  Physician Assistants and Nurse Practitioners) who all work together to provide you with the care you need, when you need it. ? ?We recommend signing up for the patient portal called "MyChart".  Sign up information is provided on this After Visit Summary.  MyChart is used to connect with patients for Virtual Visits (Telemedicine).  Patients are able to view lab/test results, encounter notes, upcoming appointments, etc.  Non-urgent messages can be sent to your provider as well.   ?To learn more about what you can do with MyChart, go to [email or web address].   ? ?Your next appointment:   ?6 month(s) ? ?The format for your next appointment:   ?In Person ? ?Provider:   ?Skeet Latch, MD{ ? ?

## 2022-03-02 ENCOUNTER — Other Ambulatory Visit: Payer: Self-pay | Admitting: Family Medicine

## 2022-03-11 ENCOUNTER — Ambulatory Visit (INDEPENDENT_AMBULATORY_CARE_PROVIDER_SITE_OTHER): Payer: Medicare PPO

## 2022-03-11 ENCOUNTER — Other Ambulatory Visit: Payer: Self-pay

## 2022-03-11 DIAGNOSIS — D696 Thrombocytopenia, unspecified: Secondary | ICD-10-CM

## 2022-03-11 LAB — CBC WITH DIFFERENTIAL/PLATELET
Basophils Absolute: 0 10*3/uL (ref 0.0–0.1)
Basophils Relative: 0.5 % (ref 0.0–3.0)
Eosinophils Absolute: 0.1 10*3/uL (ref 0.0–0.7)
Eosinophils Relative: 1.8 % (ref 0.0–5.0)
HCT: 41.7 % (ref 36.0–46.0)
Hemoglobin: 13.9 g/dL (ref 12.0–15.0)
Lymphocytes Relative: 37.1 % (ref 12.0–46.0)
Lymphs Abs: 2.7 10*3/uL (ref 0.7–4.0)
MCHC: 33.3 g/dL (ref 30.0–36.0)
MCV: 92.8 fl (ref 78.0–100.0)
Monocytes Absolute: 0.7 10*3/uL (ref 0.1–1.0)
Monocytes Relative: 9.4 % (ref 3.0–12.0)
Neutro Abs: 3.7 10*3/uL (ref 1.4–7.7)
Neutrophils Relative %: 51.2 % (ref 43.0–77.0)
Platelets: 160 10*3/uL (ref 150.0–400.0)
RBC: 4.49 Mil/uL (ref 3.87–5.11)
RDW: 13.6 % (ref 11.5–15.5)
WBC: 7.2 10*3/uL (ref 4.0–10.5)

## 2022-04-23 ENCOUNTER — Ambulatory Visit (INDEPENDENT_AMBULATORY_CARE_PROVIDER_SITE_OTHER): Payer: Medicare PPO

## 2022-04-23 VITALS — BP 110/62 | HR 96 | Temp 98.1°F | Wt 152.1 lb

## 2022-04-23 DIAGNOSIS — Z0000 Encounter for general adult medical examination without abnormal findings: Secondary | ICD-10-CM

## 2022-04-23 NOTE — Progress Notes (Signed)
? ?Subjective:  ? Barbara Munoz is a 71 y.o. female who presents for Medicare Annual (Subsequent) preventive examination. ? ?Review of Systems    ? ?Cardiac Risk Factors include: advanced age (>51mn, >>79women);dyslipidemia;sedentary lifestyle ? ?   ?Objective:  ?  ?Today's Vitals  ? 04/23/22 1300  ?BP: 110/62  ?Pulse: 96  ?Temp: 98.1 ?F (36.7 ?C)  ?SpO2: 96%  ?Weight: 152 lb 1.9 oz (69 kg)  ? ?Body mass index is 26.95 kg/m?. ? ? ?  04/23/2022  ?  1:07 PM 05/17/2021  ? 12:39 PM 04/17/2021  ?  2:57 PM 03/30/2017  ? 12:27 PM 08/15/2015  ? 11:48 AM  ?Advanced Directives  ?Does Patient Have a Medical Advance Directive? Yes Yes Yes Yes No  ?Type of AParamedicof AGaryLiving will HMillerLiving will    ?Does patient want to make changes to medical advance directive?  No - Patient declined     ?Copy of [place] Walkin Chart? No - copy requested  No - copy requested    ? ? ?Current Medications (verified) ?Outpatient Encounter Medications as of 04/23/2022  ?Medication Sig  ? ALPRAZolam (XANAX) 0.25 MG tablet Take 1 tablet (0.25 mg total) by mouth 2 (two) times daily as needed.  ? aspirin 81 MG tablet Take 81 mg by mouth daily.  ? Calcium Carbonate-Vitamin D 600-400 MG-UNIT tablet Take 1 tablet by mouth daily.  ? Cyanocobalamin (VITAMIN B12) 500 MCG TABS   ? Ferrous Sulfate (IRON) 325 (65 Fe) MG TABS Take by mouth daily.   ? fludrocortisone (FLORINEF) 0.1 MG tablet Take 1 tablet (0.1 mg total) by mouth daily.  ? loratadine (CLARITIN) 10 MG tablet Take 10 mg by mouth daily as needed. Spring and fall  ? Magnesium 250 MG TABS Take 250 mg by mouth daily.  ? Melatonin 5 MG CAPS Take 5 mg by mouth.  ? meloxicam (MOBIC) 7.5 MG tablet TAKE 1 TO 2 TABLETS EVERY DAY AS NEEDED FOR MUSCULOSKELETAL PAIN  ? midodrine (PROAMATINE) 5 MG tablet Take 5 mg by mouth 2 (two) times daily with a meal.  ? pantoprazole (PROTONIX) 40 MG tablet Take 1 tablet (40 mg  total) by mouth daily as needed.  ? sertraline (ZOLOFT) 100 MG tablet 2 tabs po qhs  ? simvastatin (ZOCOR) 40 MG tablet TAKE 1 TABLET BY MOUTH DAILY AT 6 PM.  ? ?No facility-administered encounter medications on file as of 04/23/2022.  ? ? ?Allergies (verified) ?Patient has no known allergies.  ? ?History: ?Past Medical History:  ?Diagnosis Date  ? Anxiety and depression 09/08/2011  ? Female bladder prolapse   ? GERD (gastroesophageal reflux disease) 09/05/2011  ? Improved signif with PPI  ? History of adenomatous polyp of colon 08/2017  ? Recall 3-5 yrs  ? History of Clostridium difficile infection 2007  ? colonic changes c/w with this dx seen on colonoscopy 2007---sx's responded to flagyl.  ? History of cyst of breast   ? multiple drained during menopause  ? Hyperlipidemia   ? Hypotension 2022  ? ?idiopathic  ? IFG (impaired fasting glucose) 06/2018  ? [place] 105-115 range when pt checked with husband's glucometer.  Fasting gluc here was 98 and A1c 5.6% 07/01/18.  ? Insomnia 09/05/2011  ? Migraine   ? worst during menopause  ? Orthostatic hypotension 11/20/2021  ? Overweight (BMI 25.0-29.9) 09/05/2011  ? Seasonal allergic rhinitis   ? Subclinical hypothyroidism   ? Vertigo,  benign positional 09/08/2011  ? ?Past Surgical History:  ?Procedure Laterality Date  ? CARDIOVASCULAR STRESS TEST  02/15/2015  ? Exercise myocard perfusion testing: Normal EF, normal wall motion, normal perfusion.  Poor exercise capacity (4 min).  ? COLONOSCOPY  07/2006; 08/25/17  ? 2007: pseudomembranous colitis, o/w normal. (Dr. Ardis Hughs).  08/2017: adenomatous polyp--recall 3-5 yrs  ? COSMETIC SURGERY    ? DEXA  06/19/09; 01/19/2019  ? Normal 2010 and 2020.  ? POSTERIOR REPAIR  2012  ? TONSILLECTOMY  1982  ? TRANSTHORACIC ECHOCARDIOGRAM  01/06/2022  ? NORMAL  ? Transvaginal ultrasound  10/2016  ? Normal (done for LLQ pain by Dr. Benay Pike).  ? TUBAL LIGATION  1983  ? vaginal hernia repair  05/2011  ? rectocele repair  ? ?Family History  ?Problem  Relation Age of Onset  ? Diabetes Mother 11  ?     type 2  ? Atrial fibrillation Mother   ? Aneurysm Father   ?     aortic  ? Cancer Father   ?     prostate and lung/ smoker  ? Pernicious anemia Father   ? Hyperlipidemia Sister   ? Hypertension Sister   ? Diabetes Sister   ?     type 2  ? Other Sister   ?     degenerative disc disease  ? Thyroid disease Sister   ? Cancer Sister   ?     breast  cancer s/p dbl mastectomy doing well  ? Hypertension Sister   ? Hyperlipidemia Sister   ? Diabetes Sister   ?     type 2  ? Breast cancer Sister 38  ? Hyperlipidemia Brother   ? Allergies Brother   ? Breast cancer Maternal Grandmother 76  ? Heart attack Maternal Grandfather   ? Heart disease Paternal Grandmother   ?     CHF  ? Pernicious anemia Paternal Grandmother   ? Kidney disease Paternal Grandfather   ? Stroke Paternal Grandfather   ? Diabetes Daughter   ?     type 1  ? Allergies Daughter   ? Colon cancer Neg Hx   ? Esophageal cancer Neg Hx   ? Stomach cancer Neg Hx   ? Rectal cancer Neg Hx   ? ?Social History  ? ?Socioeconomic History  ? Marital status: Married  ?  Spouse name: Not on file  ? Number of children: Not on file  ? Years of education: Not on file  ? Highest education level: Not on file  ?Occupational History  ? Not on file  ?Tobacco Use  ? Smoking status: Never  ? Smokeless tobacco: Never  ? Tobacco comments:  ?  smoked for about 6 months  ?Vaping Use  ? Vaping Use: Never used  ?Substance and Sexual Activity  ? Alcohol use: Yes  ?  Comment: moderate use  ? Drug use: No  ? Sexual activity: Yes  ?  Birth control/protection: Post-menopausal  ?Other Topics Concern  ? Not on file  ?Social History Narrative  ? Married.  ? Nonsmoker.  ? ?Social Determinants of Health  ? ?Financial Resource Strain: Low Risk   ? Difficulty of Paying Living Expenses: Not hard at all  ?Food Insecurity: No Food Insecurity  ? Worried About Charity fundraiser in the Last Year: Never true  ? Ran Out of Food in the Last Year: Never true   ?Transportation Needs: No Transportation Needs  ? Lack of Transportation (Medical): No  ? Lack  of Transportation (Non-Medical): No  ?Physical Activity: Inactive  ? Days of Exercise per Week: 0 days  ? Minutes of Exercise per Session: 0 min  ?Stress: Stress Concern Present  ? Feeling of Stress : To some extent  ?Social Connections: Moderately Integrated  ? Frequency of Communication with Friends and Family: More than three times a week  ? Frequency of Social Gatherings with Friends and Family: Three times a week  ? Attends Religious Services: More than 4 times per year  ? Active Member of Clubs or Organizations: No  ? Attends Archivist Meetings: Never  ? Marital Status: Married  ? ? ?Tobacco Counseling ?Counseling given: Not Answered ?Tobacco comments: smoked for about 6 months ? ? ?Clinical Intake: ? ?Pre-visit preparation completed: Yes ? ?Pain : No/denies pain ? ?  ? ?BMI - recorded: 26.95 ?Nutritional Status: BMI 25 -29 Overweight ?Nutritional Risks: None ?Diabetes: No ? ?How often do you need to have someone help you when you read instructions, pamphlets, or other written materials from your doctor or pharmacy?: 1 - Never ? ?Diabetic?no ? ?Interpreter Needed?: No ? ?Information entered by :: Charlott Rakes, LPN ? ? ?Activities of Daily Living ? ?  04/23/2022  ?  1:11 PM  ?In your present state of health, do you have any difficulty performing the following activities:  ?Hearing? 0  ?Vision? 0  ?Difficulty concentrating or making decisions? 0  ?Walking or climbing stairs? 0  ?Dressing or bathing? 0  ?Doing errands, shopping? 0  ?Preparing Food and eating ? N  ?Using the Toilet? N  ?In the past six months, have you accidently leaked urine? Y  ?Comment wears a pad  ?Do you have problems with loss of bowel control? N  ?Managing your Medications? N  ?Managing your Finances? N  ?Housekeeping or managing your Housekeeping? N  ? ? ?Patient Care Team: ?McGowen, Adrian Blackwater, MD as PCP - General (Family  Medicine) ?Delsa Bern, MD as Consulting Physician (Obstetrics and Gynecology) ?Milus Banister, MD as Attending Physician (Gastroenterology) ?Lavonna Monarch, MD as Consulting Physician (Dermatology) ?Irine Seal

## 2022-04-23 NOTE — Patient Instructions (Signed)
Barbara Munoz , ?Thank you for taking time to come for your Medicare Wellness Visit. I appreciate your ongoing commitment to your health goals. Please review the following plan we discussed and let me know if I can assist you in the future.  ? ?Screening recommendations/referrals: ?Colonoscopy: Done 08/25/17 repeat every 10 years  ?Mammogram: Done 05/21/21 repeat every year ?Bone Density: Done 01/19/19 repeat every 2  years ?Recommended yearly ophthalmology/optometry visit for glaucoma screening and checkup ?Recommended yearly dental visit for hygiene and checkup ? ?Vaccinations: ?Influenza vaccine: Done 08/30/21 repeat every year  ?Pneumococcal vaccine: Up to date ?Tdap vaccine: Done 06/26/16 repeat every 10 years  ?Shingles vaccine: Shingrix discussed. Please contact your pharmacy for coverage information.    ?Covid-19:Completed 1/19, 2/16, 11/07/20 & 8/17, 12/04/21 ? ?Advanced directives: Please bring a copy of your health care power of attorney and living will to the office at your convenience. ? ? ?Conditions/risks identified: get stronger and work on balance  ? ?Next appointment: Follow up in one year for your annual wellness visit  ? ? ?Preventive Care 80 Years and Older, Female ?Preventive care refers to lifestyle choices and visits with your health care provider that can promote health and wellness. ?What does preventive care include? ?A yearly physical exam. This is also called an annual well check. ?Dental exams once or twice a year. ?Routine eye exams. Ask your health care provider how often you should have your eyes checked. ?Personal lifestyle choices, including: ?Daily care of your teeth and gums. ?Regular physical activity. ?Eating a healthy diet. ?Avoiding tobacco and drug use. ?Limiting alcohol use. ?Practicing safe sex. ?Taking low-dose aspirin every day. ?Taking vitamin and mineral supplements as recommended by your health care provider. ?What happens during an annual well check? ?The services and  screenings done by your health care provider during your annual well check will depend on your age, overall health, lifestyle risk factors, and family history of disease. ?Counseling  ?Your health care provider may ask you questions about your: ?Alcohol use. ?Tobacco use. ?Drug use. ?Emotional well-being. ?Home and relationship well-being. ?Sexual activity. ?Eating habits. ?History of falls. ?Memory and ability to understand (cognition). ?Work and work Statistician. ?Reproductive health. ?Screening  ?You may have the following tests or measurements: ?Height, weight, and BMI. ?Blood pressure. ?Lipid and cholesterol levels. These may be checked every 5 years, or more frequently if you are over 47 years old. ?Skin check. ?Lung cancer screening. You may have this screening every year starting at age 67 if you have a 30-pack-year history of smoking and currently smoke or have quit within the past 15 years. ?Fecal occult blood test (FOBT) of the stool. You may have this test every year starting at age 1. ?Flexible sigmoidoscopy or colonoscopy. You may have a sigmoidoscopy every 5 years or a colonoscopy every 10 years starting at age 5. ?Hepatitis C blood test. ?Hepatitis B blood test. ?Sexually transmitted disease (STD) testing. ?Diabetes screening. This is done by checking your blood sugar (glucose) after you have not eaten for a while (fasting). You may have this done every 1-3 years. ?Bone density scan. This is done to screen for osteoporosis. You may have this done starting at age 23. ?Mammogram. This may be done every 1-2 years. Talk to your health care provider about how often you should have regular mammograms. ?Talk with your health care provider about your test results, treatment options, and if necessary, the need for more tests. ?Vaccines  ?Your health care provider may recommend certain vaccines,  such as: ?Influenza vaccine. This is recommended every year. ?Tetanus, diphtheria, and acellular pertussis (Tdap,  Td) vaccine. You may need a Td booster every 10 years. ?Zoster vaccine. You may need this after age 24. ?Pneumococcal 13-valent conjugate (PCV13) vaccine. One dose is recommended after age 28. ?Pneumococcal polysaccharide (PPSV23) vaccine. One dose is recommended after age 9. ?Talk to your health care provider about which screenings and vaccines you need and how often you need them. ?This information is not intended to replace advice given to you by your health care provider. Make sure you discuss any questions you have with your health care provider. ?Document Released: 01/04/2016 Document Revised: 08/27/2016 Document Reviewed: 10/09/2015 ?Elsevier Interactive Patient Education ? 2017 [place]. ? ?Fall Prevention in the Home ?Falls can cause injuries. They can happen to people of all ages. There are many things you can do to make your home safe and to help prevent falls. ?What can I do on the outside of my home? ?Regularly fix the edges of walkways and driveways and fix any cracks. ?Remove anything that might make you trip as you walk through a door, such as a raised step or threshold. ?Trim any bushes or trees on the path to your home. ?Use bright outdoor lighting. ?Clear any walking paths of anything that might make someone trip, such as rocks or tools. ?Regularly check to see if handrails are loose or broken. Make sure that both sides of any steps have handrails. ?Any raised decks and porches should have guardrails on the edges. ?Have any leaves, snow, or ice cleared regularly. ?Use sand or salt on walking paths during winter. ?Clean up any spills in your garage right away. This includes oil or grease spills. ?What can I do in the bathroom? ?Use night lights. ?Install grab bars by the toilet and in the tub and shower. Do not use towel bars as grab bars. ?Use non-skid mats or decals in the tub or shower. ?If you need to sit down in the shower, use a plastic, non-slip stool. ?Keep the floor dry. Clean up any  water that spills on the floor as soon as it happens. ?Remove soap buildup in the tub or shower regularly. ?Attach bath mats securely with double-sided non-slip rug tape. ?Do not have throw rugs and other things on the floor that can make you trip. ?What can I do in the bedroom? ?Use night lights. ?Make sure that you have a light by your bed that is easy to reach. ?Do not use any sheets or blankets that are too big for your bed. They should not hang down onto the floor. ?Have a firm chair that has side arms. You can use this for support while you get dressed. ?Do not have throw rugs and other things on the floor that can make you trip. ?What can I do in the kitchen? ?Clean up any spills right away. ?Avoid walking on wet floors. ?Keep items that you use a lot in easy-to-reach places. ?If you need to reach something above you, use a strong step stool that has a grab bar. ?Keep electrical cords out of the way. ?Do not use floor polish or wax that makes floors slippery. If you must use wax, use non-skid floor wax. ?Do not have throw rugs and other things on the floor that can make you trip. ?What can I do with my stairs? ?Do not leave any items on the stairs. ?Make sure that there are handrails on both sides of the stairs and  use them. Fix handrails that are broken or loose. Make sure that handrails are as long as the stairways. ?Check any carpeting to make sure that it is firmly attached to the stairs. Fix any carpet that is loose or worn. ?Avoid having throw rugs at the top or bottom of the stairs. If you do have throw rugs, attach them to the floor with carpet tape. ?Make sure that you have a light switch at the top of the stairs and the bottom of the stairs. If you do not have them, ask someone to add them for you. ?What else can I do to help prevent falls? ?Wear shoes that: ?Do not have high heels. ?Have rubber bottoms. ?Are comfortable and fit you well. ?Are closed at the toe. Do not wear sandals. ?If you use a  stepladder: ?Make sure that it is fully opened. Do not climb a closed stepladder. ?Make sure that both sides of the stepladder are locked into place. ?Ask someone to hold it for you, if possible. ?Clearly mark a

## 2022-05-05 DIAGNOSIS — H25813 Combined forms of age-related cataract, bilateral: Secondary | ICD-10-CM | POA: Diagnosis not present

## 2022-05-05 DIAGNOSIS — H10413 Chronic giant papillary conjunctivitis, bilateral: Secondary | ICD-10-CM | POA: Diagnosis not present

## 2022-05-05 DIAGNOSIS — H43811 Vitreous degeneration, right eye: Secondary | ICD-10-CM | POA: Diagnosis not present

## 2022-05-05 DIAGNOSIS — H04123 Dry eye syndrome of bilateral lacrimal glands: Secondary | ICD-10-CM | POA: Diagnosis not present

## 2022-05-12 ENCOUNTER — Ambulatory Visit: Payer: Medicare PPO | Admitting: Family Medicine

## 2022-05-12 ENCOUNTER — Encounter: Payer: Self-pay | Admitting: Family Medicine

## 2022-05-12 VITALS — BP 97/63 | HR 79 | Temp 97.6°F | Ht 63.0 in | Wt 155.0 lb

## 2022-05-12 DIAGNOSIS — F32A Depression, unspecified: Secondary | ICD-10-CM

## 2022-05-12 DIAGNOSIS — F419 Anxiety disorder, unspecified: Secondary | ICD-10-CM

## 2022-05-12 DIAGNOSIS — I950 Idiopathic hypotension: Secondary | ICD-10-CM | POA: Diagnosis not present

## 2022-05-12 MED ORDER — SHINGRIX 50 MCG/0.5ML IM SUSR: 0.5000 mL | Freq: Once | INTRAMUSCULAR | 0 refills | Status: AC

## 2022-05-12 NOTE — Progress Notes (Signed)
OFFICE VISIT  05/12/2022  CC:  Chief Complaint  Patient presents with   Anxiety   Hypertension    Patient is a 71 y.o. female who presents for 3 mo f/u anxiety and depression as well as idiopathic hypotension.  INTERIM HX: Barbara Munoz is doing fair. She deals with a lot of stress and anxiety as well as some depressed mood mainly centered around health issues with her husband.  She is worried about him and that is understandable. Additionally, her mother was recently hospitalized for heart failure. Barbara Munoz recently had an issue with loss of peripheral vision in her right eye and after visit to the eye doctor she was diagnosed with vitreous hemorrhage.  This is improving a little lately. She and her husband tried to get out into the nature of their home/property and use this as therapy.  Last visit with me she was stable, continued midodrine 5 mg 3 times daily and Florinef 0.1 mg daily.,  Also continued on sertraline and Xanax as well as simvastatin. Reviewed cardiologist note from 02/21/2022 and all was stable, midodrine midodrine and Florinef continued. She checks blood pressure regularly at home and typically gets low 100s over 60s. She will intermittently feel a significant feeling of fatigue and malaise in these occasions she rests and increase his fluid intake including Pedialyte.  PMP AWARE reviewed today: most recent rx for alprazolam was filled 02/10/22, # 38, rx by me. No red flags.  ROS as above, plus--> no fevers, no CP, no SOB, no wheezing, no cough, no HAs, no rashes, no melena/hematochezia.  No polyuria or polydipsia.  No myalgias or arthralgias.  No focal weakness, paresthesias, or tremors.  No acute vision or hearing abnormalities.  No dysuria or unusual/new urinary urgency or frequency.  No recent changes in lower legs. No n/v/d or abd pain.  No palpitations.    Past Medical History:  Diagnosis Date   Anxiety and depression 09/08/2011   Female bladder prolapse    GERD  (gastroesophageal reflux disease) 09/05/2011   Improved signif with PPI   History of adenomatous polyp of colon 08/2017   Recall 3-5 yrs   History of Clostridium difficile infection 2007   colonic changes c/w with this dx seen on colonoscopy 2007---sx's responded to flagyl.   History of cyst of breast    multiple drained during menopause   Hyperlipidemia    Hypotension 2022   ?idiopathic   IFG (impaired fasting glucose) 06/2018   Fastings 105-115 range when pt checked with husband's glucometer.  Fasting gluc here was 98 and A1c 5.6% 07/01/18.   Insomnia 09/05/2011   Migraine    worst during menopause   Orthostatic hypotension 11/20/2021   Overweight (BMI 25.0-29.9) 09/05/2011   Seasonal allergic rhinitis    Subclinical hypothyroidism    Vertigo, benign positional 09/08/2011    Past Surgical History:  Procedure Laterality Date   CARDIOVASCULAR STRESS TEST  02/15/2015   Exercise myocard perfusion testing: Normal EF, normal wall motion, normal perfusion.  Poor exercise capacity (4 min).   COLONOSCOPY  07/2006; 08/25/17   2007: pseudomembranous colitis, o/w normal. (Dr. Ardis Hughs).  08/2017: adenomatous polyp--recall 3-5 yrs   COSMETIC SURGERY     DEXA  06/19/09; 01/19/2019   Normal 2010 and 2020.   POSTERIOR REPAIR  2012   TONSILLECTOMY  1982   TRANSTHORACIC ECHOCARDIOGRAM  01/06/2022   NORMAL   Transvaginal ultrasound  10/2016   Normal (done for LLQ pain by Dr. Benay Pike).   [place]  vaginal hernia repair  05/2011   rectocele repair    Outpatient Medications Prior to Visit  Medication Sig Dispense Refill   ALPRAZolam (XANAX) 0.25 MG tablet Take 1 tablet (0.25 mg total) by mouth 2 (two) times daily as needed. 90 tablet 1   aspirin 81 MG tablet Take 81 mg by mouth daily.     Calcium Carbonate-Vitamin D 600-400 MG-UNIT tablet Take 1 tablet by mouth daily.     Cyanocobalamin (VITAMIN B12) 500 MCG TABS      Ferrous Sulfate (IRON) 325 (65 Fe) MG TABS Take by mouth  daily.      fludrocortisone (FLORINEF) 0.1 MG tablet Take 1 tablet (0.1 mg total) by mouth daily. 90 tablet 3   loratadine (CLARITIN) 10 MG tablet Take 10 mg by mouth daily as needed. Spring and fall     Magnesium 250 MG TABS Take 250 mg by mouth daily.     Melatonin 5 MG CAPS Take 5 mg by mouth.     meloxicam (MOBIC) 7.5 MG tablet TAKE 1 TO 2 TABLETS EVERY DAY AS NEEDED FOR MUSCULOSKELETAL PAIN 60 tablet 2   midodrine (PROAMATINE) 5 MG tablet Take 5 mg by mouth 2 (two) times daily with a meal.     pantoprazole (PROTONIX) 40 MG tablet Take 1 tablet (40 mg total) by mouth daily as needed. 90 tablet 3   sertraline (ZOLOFT) 100 MG tablet 2 tabs po qhs 180 tablet 3   simvastatin (ZOCOR) 40 MG tablet TAKE 1 TABLET BY MOUTH DAILY AT 6 PM. 90 tablet 0   No facility-administered medications prior to visit.    No Known Allergies  ROS As per HPI  PE:    05/12/2022   10:43 AM 04/23/2022    1:00 PM 02/21/2022    9:01 AM  Vitals with BMI  Height '5\' 3"'$     Weight 155 lbs 152 lbs 2 oz   BMI 53.61    Systolic 97 443 154  Diastolic 63 62 68  Pulse 79 96    Physical Exam  Gen: Alert, well appearing.  Patient is oriented to person, place, time, and situation. AFFECT: pleasant, lucid thought and speech. CV: RRR, no m/r/g.   LUNGS: CTA bilat, nonlabored resps, good aeration in all lung fields. EXT: no clubbing or cyanosis.  no edema.    LABS:  Last CBC Lab Results  Component Value Date   WBC 7.2 03/11/2022   HGB 13.9 03/11/2022   HCT 41.7 03/11/2022   MCV 92.8 03/11/2022   MCH 31.8 05/17/2021   RDW 13.6 03/11/2022   PLT 160.0 00/86/7619   Last metabolic panel Lab Results  Component Value Date   GLUCOSE 86 02/10/2022   NA 141 02/10/2022   K 4.0 02/10/2022   CL 105 02/10/2022   CO2 31 02/10/2022   BUN 18 02/10/2022   CREATININE 0.69 02/10/2022   GFRNONAA >60 05/17/2021   CALCIUM 9.2 02/10/2022   PHOS 3.0 11/09/2012   PROT 6.9 02/10/2022   ALBUMIN 4.4 02/10/2022   BILITOT 0.6  02/10/2022   ALKPHOS 47 02/10/2022   AST 31 02/10/2022   ALT 33 02/10/2022   ANIONGAP 7 05/17/2021   Last lipids Lab Results  Component Value Date   CHOL 188 02/10/2022   HDL 48.90 02/10/2022   LDLCALC 105 (H) 02/10/2022   LDLDIRECT 110.6 10/26/2013   TRIG 170.0 (H) 02/10/2022   CHOLHDL 4 02/10/2022   Last hemoglobin A1c Lab Results  Component Value Date   HGBA1C 5.4  11/08/2019   Last thyroid functions Lab Results  Component Value Date   TSH 2.37 02/10/2022   T3TOTAL 112.4 06/30/2017   IMPRESSION AND PLAN:  #1 anxiety and depression. She is doing pretty good with self-help and sertraline 200 mg a day. She is interested in counseling so I will take a look to see a good fit for her and make referral at that time.  2.  Idiopathic hypotension. Doing well on midodrine 5 mg 3 times daily and Florinef 0.1 mg daily.  She admits that she can get more compliant with compression stockings. She increases fluid intake as needed appropriately.  No labs today  An After Visit Summary was printed and given to the patient.  FOLLOW UP: No follow-ups on file. Next cpe 01/2023  Signed:  Crissie Sickles, MD           05/12/2022

## 2022-05-13 ENCOUNTER — Other Ambulatory Visit: Payer: Self-pay | Admitting: Family Medicine

## 2022-05-22 HISTORY — PX: REFRACTIVE SURGERY: SHX103

## 2022-06-04 ENCOUNTER — Encounter (INDEPENDENT_AMBULATORY_CARE_PROVIDER_SITE_OTHER): Payer: Medicare PPO | Admitting: Ophthalmology

## 2022-06-04 DIAGNOSIS — H2513 Age-related nuclear cataract, bilateral: Secondary | ICD-10-CM

## 2022-06-04 DIAGNOSIS — H43811 Vitreous degeneration, right eye: Secondary | ICD-10-CM | POA: Diagnosis not present

## 2022-06-04 DIAGNOSIS — H33011 Retinal detachment with single break, right eye: Secondary | ICD-10-CM

## 2022-06-04 DIAGNOSIS — H43813 Vitreous degeneration, bilateral: Secondary | ICD-10-CM | POA: Diagnosis not present

## 2022-06-11 ENCOUNTER — Other Ambulatory Visit: Payer: Self-pay | Admitting: Family Medicine

## 2022-06-19 ENCOUNTER — Encounter (INDEPENDENT_AMBULATORY_CARE_PROVIDER_SITE_OTHER): Payer: Medicare PPO | Admitting: Ophthalmology

## 2022-06-19 DIAGNOSIS — H33301 Unspecified retinal break, right eye: Secondary | ICD-10-CM | POA: Diagnosis not present

## 2022-06-19 DIAGNOSIS — H43813 Vitreous degeneration, bilateral: Secondary | ICD-10-CM | POA: Diagnosis not present

## 2022-07-08 ENCOUNTER — Encounter: Payer: Self-pay | Admitting: Dermatology

## 2022-07-08 ENCOUNTER — Other Ambulatory Visit: Payer: Self-pay | Admitting: Dermatology

## 2022-07-08 ENCOUNTER — Ambulatory Visit: Payer: Medicare PPO | Admitting: Dermatology

## 2022-07-08 DIAGNOSIS — B001 Herpesviral vesicular dermatitis: Secondary | ICD-10-CM | POA: Diagnosis not present

## 2022-07-08 DIAGNOSIS — Z1283 Encounter for screening for malignant neoplasm of skin: Secondary | ICD-10-CM

## 2022-07-08 DIAGNOSIS — L719 Rosacea, unspecified: Secondary | ICD-10-CM

## 2022-07-08 DIAGNOSIS — L7211 Pilar cyst: Secondary | ICD-10-CM

## 2022-07-08 MED ORDER — VALACYCLOVIR HCL 1 G PO TABS: 1000.0000 mg | ORAL_TABLET | Freq: Every day | ORAL | 1 refills | Status: DC

## 2022-07-08 MED ORDER — IVERMECTIN 1 % EX CREA: 1.0000 | TOPICAL_CREAM | Freq: Every day | CUTANEOUS | 6 refills | Status: DC

## 2022-07-25 ENCOUNTER — Other Ambulatory Visit: Payer: Self-pay | Admitting: Obstetrics and Gynecology

## 2022-07-25 DIAGNOSIS — Z1231 Encounter for screening mammogram for malignant neoplasm of breast: Secondary | ICD-10-CM

## 2022-07-30 ENCOUNTER — Encounter: Payer: Self-pay | Admitting: Dermatology

## 2022-07-30 NOTE — Progress Notes (Signed)
   Follow-Up Visit   Subjective  Barbara Munoz is a 71 y.o. female who presents for the following: Annual Exam (No new concerns - has noticed either acne/rosacea flare- tx- none- hasn't bothered me for years).  General skin examination, several dermatological issues of concern Location:  Duration:  Quality:  Associated Signs/Symptoms: Modifying Factors:  Severity:  Timing: Context:   Objective  Well appearing patient in no apparent distress; mood and affect are within normal limits. Full body skin exam.  No atypical pigmented lesions (dermoscopy), no nonmelanoma skin cancer  Mid Upper [place] Lip History of fever blisters upper lip, resolving edematous 5 mm crusted  Head - Anterior (Face) Central facial erythema compatible with rosacea.  Cause, triggers, and treatment options were reviewed.  Scalp Noninflamed 1.4 cm mobile subcutaneous nodule, historically stable    A full examination was performed including scalp, head, eyes, ears, nose, lips, neck, chest, axillae, abdomen, back, buttocks, bilateral upper extremities, bilateral lower extremities, hands, feet, fingers, toes, fingernails, and toenails. All findings within normal limits unless otherwise noted below.  Areas beneath undergarments not fully examined   Assessment & Plan    Fever blister Mid Upper [place] Lip  Can take valtrex at first sign of fever blister  Related Medications valACYclovir (VALTREX) 1000 MG tablet Take 1 tablet (1,000 mg total) by mouth daily.  Rosacea Head - Anterior (Face)  soolantra  is not a covered topical medication so we will prescribe topical metronidazole to be applied nightly for 6 weeks.  Related Medications Ivermectin (SOOLANTRA) 1 % CREA Apply 1 Application topically daily.  Pilar cyst Scalp  Can schedule removal if patients wants it removed.  Screening for malignant neoplasm of skin  Yearly skin exams.       I, Lavonna Monarch, MD, have reviewed all  documentation for this visit.  The documentation on 07/30/22 for the exam, diagnosis, procedures, and orders are all accurate and complete.

## 2022-08-01 ENCOUNTER — Ambulatory Visit
Admission: RE | Admit: 2022-08-01 | Discharge: 2022-08-01 | Disposition: A | Discharge status: Home / Self Care | Payer: Medicare PPO | Source: Ambulatory Visit | Attending: Obstetrics and Gynecology | Admitting: Obstetrics and Gynecology

## 2022-08-01 DIAGNOSIS — Z1231 Encounter for screening mammogram for malignant neoplasm of breast: Secondary | ICD-10-CM | POA: Diagnosis not present

## 2022-08-05 ENCOUNTER — Other Ambulatory Visit: Payer: Self-pay | Admitting: Obstetrics and Gynecology

## 2022-08-05 DIAGNOSIS — R928 Other abnormal and inconclusive findings on diagnostic imaging of breast: Secondary | ICD-10-CM

## 2022-08-06 ENCOUNTER — Ambulatory Visit: Payer: Medicare PPO | Admitting: Family Medicine

## 2022-08-09 DIAGNOSIS — U071 COVID-19: Secondary | ICD-10-CM | POA: Diagnosis not present

## 2022-08-10 ENCOUNTER — Other Ambulatory Visit: Payer: Self-pay | Admitting: Family Medicine

## 2022-08-15 ENCOUNTER — Ambulatory Visit: Payer: Medicare PPO | Admitting: Family Medicine

## 2022-08-21 ENCOUNTER — Other Ambulatory Visit: Payer: Medicare PPO

## 2022-08-22 ENCOUNTER — Ambulatory Visit: Payer: Medicare PPO | Admitting: Family Medicine

## 2022-08-22 VITALS — BP 95/60 | HR 75 | Temp 97.9°F | Ht 63.0 in | Wt 148.6 lb

## 2022-08-22 DIAGNOSIS — I950 Idiopathic hypotension: Secondary | ICD-10-CM

## 2022-08-22 DIAGNOSIS — F3341 Major depressive disorder, recurrent, in partial remission: Secondary | ICD-10-CM | POA: Diagnosis not present

## 2022-08-22 DIAGNOSIS — F411 Generalized anxiety disorder: Secondary | ICD-10-CM

## 2022-08-22 DIAGNOSIS — J019 Acute sinusitis, unspecified: Secondary | ICD-10-CM

## 2022-08-22 DIAGNOSIS — Z8616 Personal history of COVID-19: Secondary | ICD-10-CM | POA: Diagnosis not present

## 2022-08-22 DIAGNOSIS — J209 Acute bronchitis, unspecified: Secondary | ICD-10-CM | POA: Diagnosis not present

## 2022-08-22 MED ORDER — HYDROCODONE BIT-HOMATROP MBR 5-1.5 MG/5ML PO SOLN: 5.0000 mL | Freq: Three times a day (TID) | ORAL | 0 refills | Status: DC | PRN

## 2022-08-22 MED ORDER — ALPRAZOLAM 0.25 MG PO TABS
0.2500 mg | ORAL_TABLET | Freq: Two times a day (BID) | ORAL | 1 refills | Status: DC | PRN
Start: 2022-08-22 — End: 2023-04-14

## 2022-08-22 MED ORDER — SIMVASTATIN 40 MG PO TABS: ORAL_TABLET | ORAL | 3 refills | Status: DC

## 2022-08-22 MED ORDER — DOXYCYCLINE HYCLATE 100 MG PO CAPS: 100.0000 mg | ORAL_CAPSULE | Freq: Two times a day (BID) | ORAL | 0 refills | Status: AC

## 2022-08-22 MED ORDER — FLUTICASONE PROPIONATE 50 MCG/ACT NA SUSP: 2.0000 | Freq: Every day | NASAL | 6 refills | Status: DC

## 2022-08-22 NOTE — Progress Notes (Signed)
OFFICE VISIT  09/04/2022  CC:  Chief Complaint  Patient presents with   Depression   Patient is a 71 y.o. female who presents for 3 mo f/u anxiety and depression as well as idiopathic hypotension. A/P as of last visit: "#1 anxiety and depression. She is doing pretty good with self-help and sertraline 200 mg a day. She is interested in counseling so I will take a look to see a good fit for her and make referral at that time.  2.  Idiopathic hypotension. Doing well on midodrine 5 mg 3 times daily and Florinef 0.1 mg daily.  She admits that she can get more compliant with compression stockings. She increases fluid intake as needed appropriately. No labs today."  INTERIM HX: Patient had COVID infection 08/09/2022.  Describes lots of respiratory congestion cough and fevers and flulike body aches.  She says her fevers are gone but other symptoms continue. Denies chest pain or shortness of breath or wheezing.  No nausea or vomiting or diarrhea.  She still struggles with intermittent depressed mood.  Her sleep is not very good.  Her mother is about to turn 54!  Taking care of her chronically ill husband.  No significant orthostatic hypotension symptoms.  No remarkably low blood pressures since I last saw her.  She continues on Florinef 0.1 mg daily and midodrine 5 mg twice daily.  PMP AWARE reviewed today: most recent rx for alprazolam was filled 06/16/2022, #60, rx by me. No red flags.   Past Medical History:  Diagnosis Date   Anxiety and depression 09/08/2011   Female bladder prolapse    GERD (gastroesophageal reflux disease) 09/05/2011   Improved signif with PPI   History of adenomatous polyp of colon 08/2017   Recall 3-5 yrs   History of Clostridium difficile infection 2007   colonic changes c/w with this dx seen on colonoscopy 2007---sx's responded to flagyl.   History of cyst of breast    multiple drained during menopause   Hyperlipidemia    Hypotension 2022   ?idiopathic    IFG (impaired fasting glucose) 06/2018   Fastings 105-115 range when pt checked with husband's glucometer.  Fasting gluc here was 98 and A1c 5.6% 07/01/18.   Insomnia 09/05/2011   Migraine    worst during menopause   Orthostatic hypotension 11/20/2021   Overweight (BMI 25.0-29.9) 09/05/2011   Seasonal allergic rhinitis    Subclinical hypothyroidism    Vertigo, benign positional 09/08/2011    Past Surgical History:  Procedure Laterality Date   CARDIOVASCULAR STRESS TEST  02/15/2015   Exercise myocard perfusion testing: Normal EF, normal wall motion, normal perfusion.  Poor exercise capacity (4 min).   COLONOSCOPY  07/2006; 08/25/17   2007: pseudomembranous colitis, o/w normal. (Dr. Ardis Hughs).  08/2017: adenomatous polyp--recall 3-5 yrs   COSMETIC SURGERY     DEXA  06/19/09; 01/19/2019   Normal 2010 and 2020.   POSTERIOR REPAIR  2012   TONSILLECTOMY  1982   TRANSTHORACIC ECHOCARDIOGRAM  01/06/2022   NORMAL   Transvaginal ultrasound  10/2016   Normal (done for LLQ pain by Dr. Benay Pike).   TUBAL LIGATION  1983   vaginal hernia repair  05/2011   rectocele repair    Outpatient Medications Prior to Visit  Medication Sig Dispense Refill   aspirin 81 MG tablet Take 81 mg by mouth daily.     Calcium Carbonate-Vitamin D 600-400 MG-UNIT tablet Take 1 tablet by mouth daily.     Cyanocobalamin (VITAMIN B12) 500 MCG TABS  Ferrous Sulfate (IRON) 325 (65 Fe) MG TABS Take by mouth daily.      fludrocortisone (FLORINEF) 0.1 MG tablet TAKE 1 TABLET BY MOUTH EVERY DAY 30 tablet 0   loratadine (CLARITIN) 10 MG tablet Take 10 mg by mouth daily as needed. Spring and fall     Magnesium 250 MG TABS Take 250 mg by mouth daily.     Melatonin 5 MG CAPS Take 5 mg by mouth.     meloxicam (MOBIC) 7.5 MG tablet TAKE 1 TO 2 TABLETS EVERY DAY AS NEEDED FOR MUSCULOSKELETAL PAIN 60 tablet 2   METRONIDAZOLE, TOPICAL, 0.75 % LOTN Apply 1 Application topically daily. 59 mL 0   midodrine (PROAMATINE) 5 MG tablet  Take 5 mg by mouth 2 (two) times daily with a meal.     pantoprazole (PROTONIX) 40 MG tablet Take 1 tablet (40 mg total) by mouth daily as needed. 90 tablet 3   sertraline (ZOLOFT) 100 MG tablet 2 tabs po qhs 180 tablet 3   valACYclovir (VALTREX) 1000 MG tablet Take 1 tablet (1,000 mg total) by mouth daily. 10 tablet 1   ALPRAZolam (XANAX) 0.25 MG tablet Take 1 tablet (0.25 mg total) by mouth 2 (two) times daily as needed. 90 tablet 1   simvastatin (ZOCOR) 40 MG tablet TAKE 1 TABLET BY MOUTH DAILY AT 6 PM. 90 tablet 0   No facility-administered medications prior to visit.    No Known Allergies  ROS As per HPI  PE:    08/22/2022    1:37 PM 05/12/2022   10:43 AM 04/23/2022    1:00 PM  Vitals with BMI  Height '5\' 3"'$  '5\' 3"'$    Weight 148 lbs 10 oz 155 lbs 152 lbs 2 oz  BMI 38.25 05.39   Systolic 95 97 767  Diastolic 60 63 62  Pulse 75 79 96     Physical Exam  VS: noted--normal. Gen: Alert, well appearing.  Patient is oriented to person, place, time, and situation. AFFECT: pleasant, lucid thought and speech.  LABS:  Last CBC Lab Results  Component Value Date   WBC 7.2 03/11/2022   HGB 13.9 03/11/2022   HCT 41.7 03/11/2022   MCV 92.8 03/11/2022   MCH 31.8 05/17/2021   RDW 13.6 03/11/2022   PLT 160.0 34/19/3790   Last metabolic panel Lab Results  Component Value Date   GLUCOSE 86 02/10/2022   NA 141 02/10/2022   K 4.0 02/10/2022   CL 105 02/10/2022   CO2 31 02/10/2022   BUN 18 02/10/2022   CREATININE 0.69 02/10/2022   GFRNONAA >60 05/17/2021   CALCIUM 9.2 02/10/2022   PHOS 3.0 11/09/2012   PROT 6.9 02/10/2022   ALBUMIN 4.4 02/10/2022   BILITOT 0.6 02/10/2022   ALKPHOS 47 02/10/2022   AST 31 02/10/2022   ALT 33 02/10/2022   ANIONGAP 7 05/17/2021   Last lipids Lab Results  Component Value Date   CHOL 188 02/10/2022   HDL 48.90 02/10/2022   LDLCALC 105 (H) 02/10/2022   LDLDIRECT 110.6 10/26/2013   TRIG 170.0 (H) 02/10/2022   CHOLHDL 4 02/10/2022   Last  hemoglobin A1c Lab Results  Component Value Date   HGBA1C 5.4 11/08/2019   Last thyroid functions Lab Results  Component Value Date   TSH 2.37 02/10/2022   T3TOTAL 112.4 06/30/2017   IMPRESSION AND PLAN:  #1 COVID respiratory infection.  Her symptoms are lingering quite a bit. We will treat for secondary bacterial sinusitis/bronchitis. Doxycycline 100 twice daily for  7 days. Hycodan syrup, 120 mL.  #2 major depressive disorder, general anxiety. She is fairly stable and we will make no changes today.  Continue sertraline 200 mg daily and alprazolam 0.25 mg twice daily as needed.  3 idiopathic hypotension.  Stable on midodrine 5 mg twice daily and Florinef 0.1 g daily. No labs today  An After Visit Summary was printed and given to the patient.  FOLLOW UP: Return in about 3 months (around 11/21/2022) for routine chronic illness f/u.  Signed:  Crissie Sickles, MD           09/04/2022

## 2022-08-28 ENCOUNTER — Ambulatory Visit
Admission: RE | Admit: 2022-08-28 | Discharge: 2022-08-28 | Disposition: A | Discharge status: Home / Self Care | Payer: Medicare PPO | Source: Ambulatory Visit | Attending: Obstetrics and Gynecology | Admitting: Obstetrics and Gynecology

## 2022-08-28 ENCOUNTER — Ambulatory Visit: Payer: Medicare PPO

## 2022-08-28 DIAGNOSIS — N6489 Other specified disorders of breast: Secondary | ICD-10-CM | POA: Diagnosis not present

## 2022-08-28 DIAGNOSIS — R928 Other abnormal and inconclusive findings on diagnostic imaging of breast: Secondary | ICD-10-CM

## 2022-08-28 DIAGNOSIS — R922 Inconclusive mammogram: Secondary | ICD-10-CM | POA: Diagnosis not present

## 2022-09-02 ENCOUNTER — Other Ambulatory Visit: Payer: Self-pay | Admitting: Family Medicine

## 2022-09-05 ENCOUNTER — Other Ambulatory Visit: Payer: Self-pay | Admitting: Family Medicine

## 2022-09-25 ENCOUNTER — Other Ambulatory Visit: Payer: Self-pay | Admitting: Family Medicine

## 2022-09-25 MED ORDER — FLUDROCORTISONE ACETATE 0.1 MG PO TABS: 100.0000 ug | ORAL_TABLET | Freq: Every day | ORAL | 0 refills | Status: DC

## 2022-09-25 NOTE — Telephone Encounter (Signed)
RF request for Florinef LOV: 08/22/22, acute Next ov: 11/21/22 [facility] Last written: 08/11/22 (30,0)   Please fill, if appropriate. Pt is requesting 30 day supply until she can schedule with Dr.Story.

## 2022-10-07 ENCOUNTER — Encounter: Payer: Self-pay | Admitting: Nurse Practitioner

## 2022-10-17 ENCOUNTER — Other Ambulatory Visit: Payer: Self-pay | Admitting: Family Medicine

## 2022-10-18 IMAGING — MG MM DIGITAL SCREENING BILAT W/ TOMO AND CAD
6 of 10 series · 6 of 30 positions shown · non-contrast
Comparison: Previous exam(s).

CLINICAL DATA: Screening.

EXAM:
DIGITAL SCREENING BILATERAL MAMMOGRAM WITH TOMOSYNTHESIS AND CAD
TECHNIQUE: Bilateral screening digital craniocaudal and mediolateral oblique
mammograms were obtained. Bilateral screening digital breast
tomosynthesis was performed. The images were evaluated with
computer-aided detection.

[L CC synth-2D]
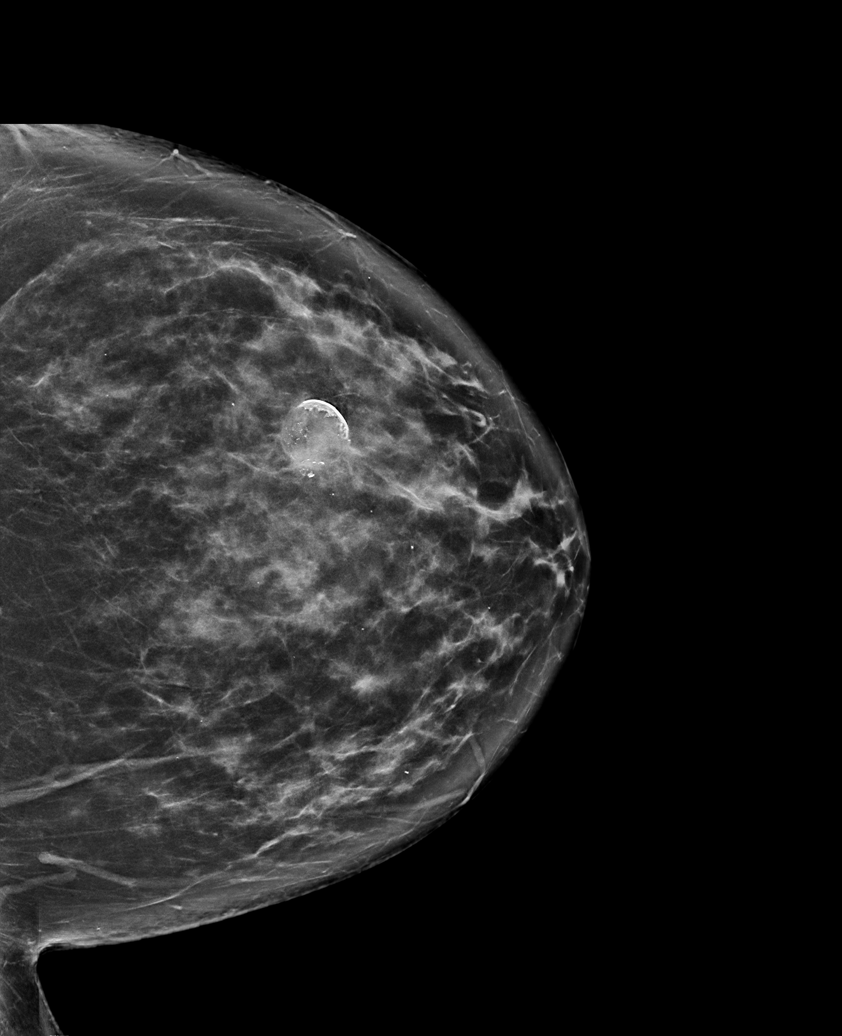

[R MLO synth-2D (1 of 2)]
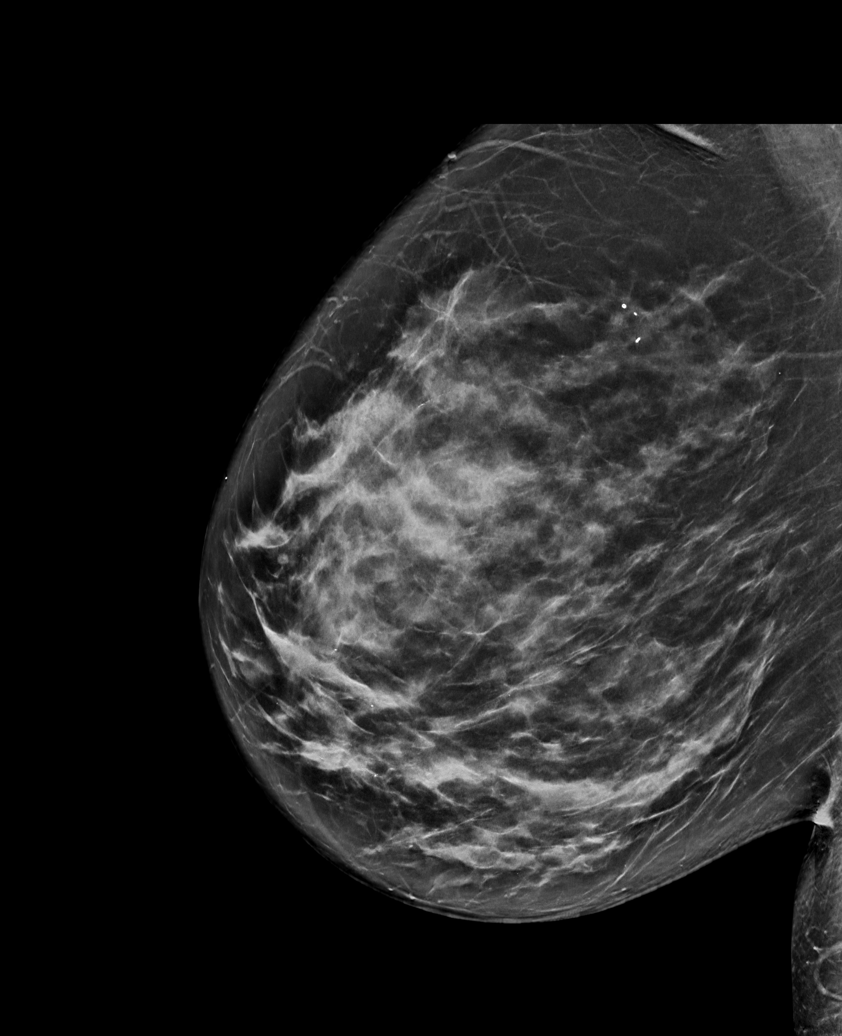

[R CC synth-2D]
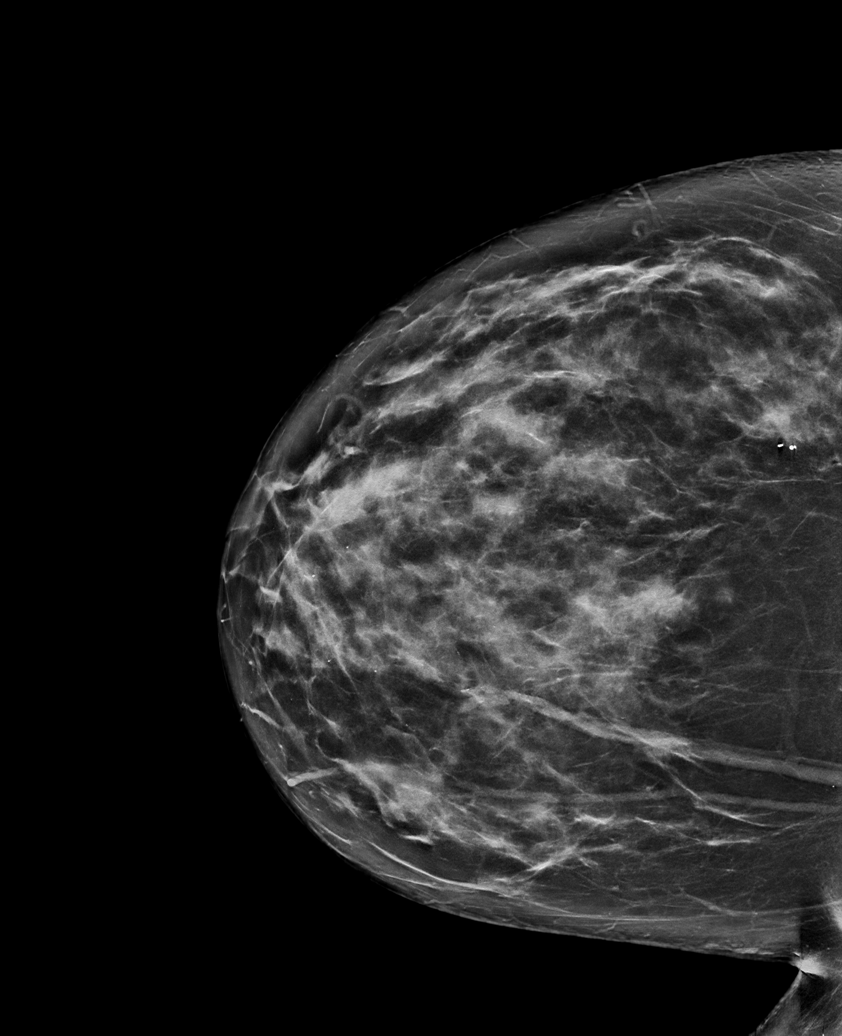

[R MLO synth-2D (2 of 2)]
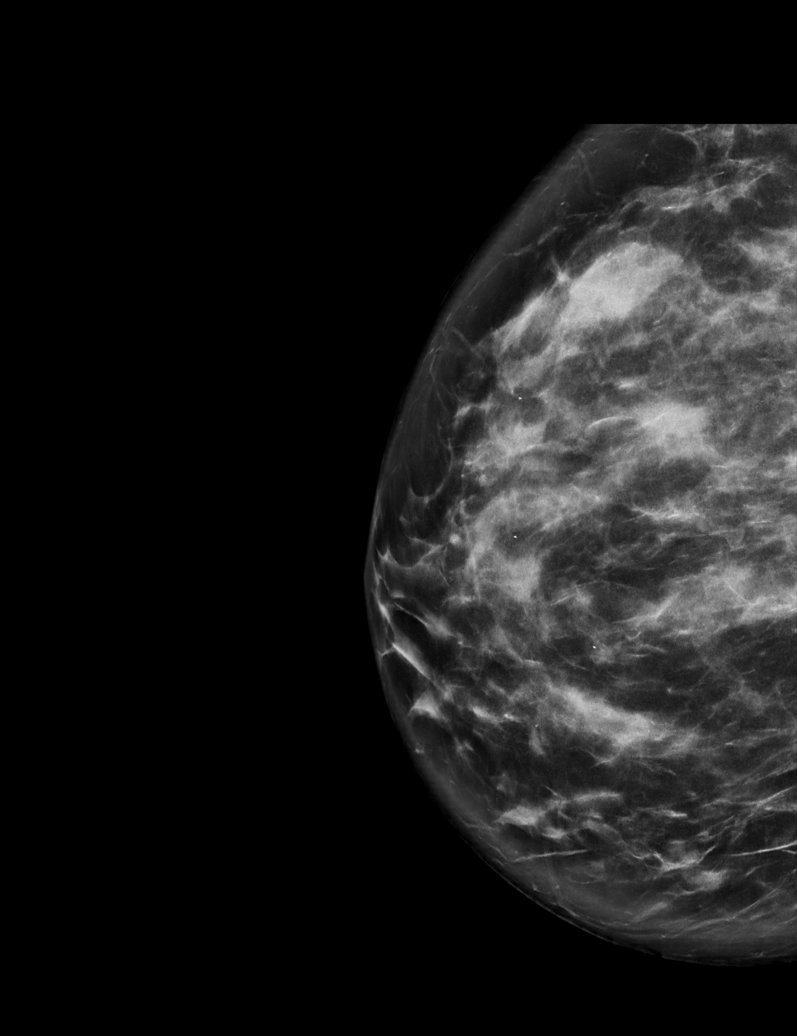

[L MLO synth-2D]
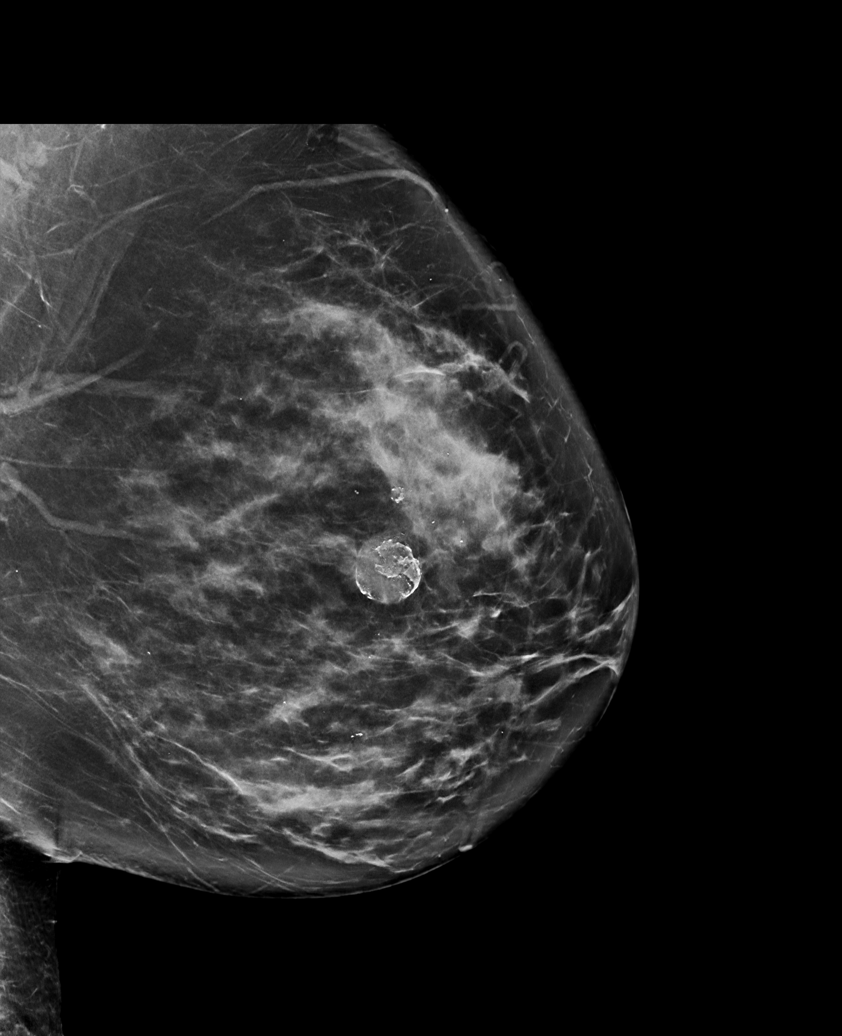

[R MLO tomo · tomo slice 42/83.0]
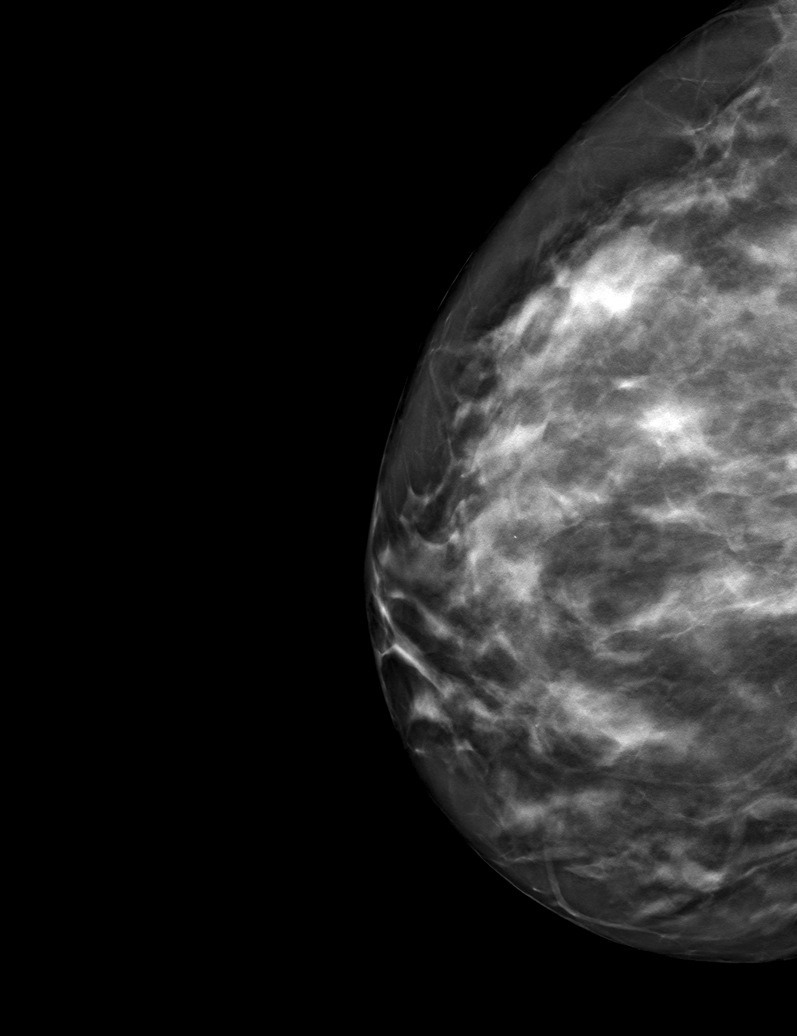

[6 of 30 positions shown; findings below may reference images not displayed]

ACR Breast Density Category c: The breast tissue is heterogeneously
dense, which may obscure small masses.
FINDINGS: There are no findings suspicious for malignancy. The images were
evaluated with computer-aided detection.
IMPRESSION: No mammographic evidence of malignancy. A result letter of this
screening mammogram will be mailed directly to the patient.

RECOMMENDATION:
Screening mammogram in one year. (Code:T4-5-GWO)

BI-RADS CATEGORY  1: Negative.

## 2022-10-21 ENCOUNTER — Other Ambulatory Visit: Payer: Self-pay | Admitting: Family Medicine

## 2022-10-21 ENCOUNTER — Encounter (HOSPITAL_BASED_OUTPATIENT_CLINIC_OR_DEPARTMENT_OTHER): Payer: Self-pay | Admitting: Cardiovascular Disease

## 2022-10-21 MED ORDER — FLUDROCORTISONE ACETATE 0.1 MG PO TABS: 100.0000 ug | ORAL_TABLET | Freq: Every day | ORAL | 2 refills | Status: DC

## 2022-10-22 ENCOUNTER — Encounter (INDEPENDENT_AMBULATORY_CARE_PROVIDER_SITE_OTHER): Payer: Medicare PPO | Admitting: Ophthalmology

## 2022-10-22 DIAGNOSIS — H33301 Unspecified retinal break, right eye: Secondary | ICD-10-CM

## 2022-10-22 DIAGNOSIS — H43813 Vitreous degeneration, bilateral: Secondary | ICD-10-CM | POA: Diagnosis not present

## 2022-11-06 ENCOUNTER — Ambulatory Visit: Payer: Medicare PPO | Admitting: Nurse Practitioner

## 2022-11-06 ENCOUNTER — Encounter: Payer: Self-pay | Admitting: Nurse Practitioner

## 2022-11-06 ENCOUNTER — Other Ambulatory Visit (INDEPENDENT_AMBULATORY_CARE_PROVIDER_SITE_OTHER): Payer: Medicare PPO

## 2022-11-06 ENCOUNTER — Other Ambulatory Visit: Payer: Self-pay | Admitting: Nurse Practitioner

## 2022-11-06 VITALS — BP 100/70 | HR 82 | Ht 63.0 in | Wt 151.2 lb

## 2022-11-06 DIAGNOSIS — Z86010 Personal history of colonic polyps: Secondary | ICD-10-CM | POA: Diagnosis not present

## 2022-11-06 DIAGNOSIS — D509 Iron deficiency anemia, unspecified: Secondary | ICD-10-CM | POA: Diagnosis not present

## 2022-11-06 DIAGNOSIS — K219 Gastro-esophageal reflux disease without esophagitis: Secondary | ICD-10-CM

## 2022-11-06 LAB — IBC + FERRITIN
Ferritin: 236 ng/mL (ref 10.0–291.0)
Iron: 90 ug/dL (ref 42–145)
Saturation Ratios: 27.6 % (ref 20.0–50.0)
TIBC: 326.2 ug/dL (ref 250.0–450.0)
Transferrin: 233 mg/dL (ref 212.0–360.0)

## 2022-11-06 LAB — CBC WITH DIFFERENTIAL/PLATELET
Basophils Absolute: 0 10*3/uL (ref 0.0–0.1)
Basophils Relative: 0.3 % (ref 0.0–3.0)
Eosinophils Absolute: 0.1 10*3/uL (ref 0.0–0.7)
Eosinophils Relative: 1.6 % (ref 0.0–5.0)
HCT: 43.2 % (ref 36.0–46.0)
Hemoglobin: 14.7 g/dL (ref 12.0–15.0)
Lymphocytes Relative: 39.1 % (ref 12.0–46.0)
Lymphs Abs: 2.7 10*3/uL (ref 0.7–4.0)
MCHC: 34 g/dL (ref 30.0–36.0)
MCV: 93.2 fl (ref 78.0–100.0)
Monocytes Absolute: 0.6 10*3/uL (ref 0.1–1.0)
Monocytes Relative: 8.4 % (ref 3.0–12.0)
Neutro Abs: 3.5 10*3/uL (ref 1.4–7.7)
Neutrophils Relative %: 50.6 % (ref 43.0–77.0)
Platelets: 175 10*3/uL (ref 150.0–400.0)
RBC: 4.64 Mil/uL (ref 3.87–5.11)
RDW: 13.5 % (ref 11.5–15.5)
WBC: 7 10*3/uL (ref 4.0–10.5)

## 2022-11-06 LAB — COMPREHENSIVE METABOLIC PANEL
ALT: 38 U/L — ABNORMAL HIGH (ref 0–35)
AST: 34 U/L (ref 0–37)
Albumin: 4.7 g/dL (ref 3.5–5.2)
Alkaline Phosphatase: 57 U/L (ref 39–117)
BUN: 13 mg/dL (ref 6–23)
CO2: 32 mEq/L (ref 19–32)
Calcium: 9.6 mg/dL (ref 8.4–10.5)
Chloride: 101 mEq/L (ref 96–112)
Creatinine, Ser: 0.72 mg/dL (ref 0.40–1.20)
GFR: 84.39 mL/min (ref >60.00)
Glucose, Bld: 90 mg/dL (ref 70–99)
Potassium: 4.7 mEq/L (ref 3.5–5.1)
Sodium: 137 mEq/L (ref 135–145)
Total Bilirubin: 0.4 mg/dL (ref 0.2–1.2)
Total Protein: 7.7 g/dL (ref 6.0–8.3)

## 2022-11-06 MED ORDER — NA SULFATE-K SULFATE-MG SULF 17.5-3.13-1.6 GM/177ML PO SOLN: 1.0000 | Freq: Once | ORAL | 0 refills | Status: AC

## 2022-11-06 NOTE — Progress Notes (Signed)
Agree with assessment and plan as outlined.  

## 2022-11-06 NOTE — Progress Notes (Signed)
11/06/2022 Barbara Munoz [id number] 1951-07-08   CHIEF COMPLAINT: Constipation   HISTORY OF PRESENT ILLNESS:  Barbara Munoz is a 71 year old female with a past medical history of anxiety, orthostatic hypotension, GERD, C. Difficile 2007 and colon polyps. She presents today for further evaluation regarding acid reflux and constipation. She is accompanied by her husband. She is known by Dr. Ardis Hughs.  She has a history of acid reflux for which she takes pantoprazole 40 mg once daily for several years.  She has breakthrough heartburn if she eats fried foods or if she drinks wine, citrus juice or coffee.  She has infrequent dysphagia, feels like her epiglottis does not work.  She sometimes has difficulty swallowing liquids, saliva and solid foods.  Food does not get stuck in the esophagus.  Her dysphagia symptoms occur once every 2 to 4 weeks for several years.  She denies ever having an EGD.  She takes Meloxicam once daily for fibromyalgia pain for at least the past 2 to 3 years.  She has chronic constipation.  She passes a small brown formed or pellet type stool every other day and sometimes she passes 2 small stools in 1 day.  She also passes loose black tarry stools which are difficult to clean up which occurs for 1 day every few weeks for the past 3 to 4 years.  She infrequently passes a small amount of stool when she passes gas per the rectum.  Infrequent lower abdominal pain which typically occurs if she needs to pass gas or prior to passing a bowel movement.  She sees a small amount of bright red blood in the toilet tissue which she attributes to having hemorrhoids and occurs once every 2 to 4 weeks.  No anorectal pain.  She reported having iron deficiency anemia associated with blood donation for which she takes oral iron once daily for the past 5 years.  She last donated blood 5 years ago.  Her most recent colonoscopy was 08/25/2017 and 4 tubular adenomatous/sessile serrated polyps were removed  from the a sending colon and rectum.  She underwent a colonoscopy in 2007, procedure report not available but random colon biopsies were negative for active colitis or dysplasia biopsies of the sigmoid showed a focus of cryptitis, possibly indicating minimal active colitis.  No known family history of IBD or colorectal cancer.  She has a history of orthostatic hypotension followed by cardiologist Dr. Skeet Latch. Cortisol and ACTH were within normal limits.  She is on Midodrine 5 mg 3 times daily and Florinef 0.1 mg daily.  Echo 01/06/2022 showed LVEF 55 to 60% with normal RV function.     Latest Ref Rng & Units 03/11/2022   10:59 AM 02/10/2022    9:51 AM 09/17/2021   10:49 AM  CBC  WBC 4.0 - 10.5 K/uL 7.2  8.4  6.4   Hemoglobin 12.0 - 15.0 g/dL 13.9  13.6  14.5   Hematocrit 36.0 - 46.0 % 41.7  41.3  43.5   Platelets 150.0 - 400.0 K/uL 160.0  137.0  156.0        Latest Ref Rng & Units 02/10/2022    9:51 AM 09/17/2021   10:49 AM 08/08/2021   10:23 AM  CMP  Glucose 70 - 99 mg/dL 86  90  84   BUN 6 - 23 mg/dL '18  14  17   '$ Creatinine 0.40 - 1.20 mg/dL 0.69  0.72  0.73   Sodium 135 - 145  mEq/L 141  140  139   Potassium 3.5 - 5.1 mEq/L 4.0  4.1  4.0   Chloride 96 - 112 mEq/L 105  103  104   CO2 19 - 32 mEq/L '31  26  27   '$ Calcium 8.4 - 10.5 mg/dL 9.2  9.5  9.2   Total Protein 6.0 - 8.3 g/dL 6.9  7.1  6.8   Total Bilirubin 0.2 - 1.2 mg/dL 0.6  0.5  0.5   Alkaline Phos 39 - 117 U/L 47  50  47   AST 0 - 37 U/L 31  36  25   ALT 0 - 35 U/L 33  41  27     ECHO 01/06/2022: IMPRESSIONS Left ventricular ejection fraction, by estimation, is 55 to 60%. Left ventricular ejection fraction by 2D MOD biplane is 58.6 %. The left ventricle has normal function. The left ventricle has no regional wall motion abnormalities. Left ventricular diastolic parameters were normal. 1. Right ventricular systolic function is normal. The right ventricular size is normal. Tricuspid regurgitation signal is  inadequate for assessing PA pressure. 2. The mitral valve is grossly normal. Mild mitral valve regurgitation. No evidence of mitral stenosis. 3. The aortic valve is tricuspid. Aortic valve regurgitation is not visualized. No aortic stenosis is present. 4. The inferior vena cava is normal in size with greater than 50% respiratory variability, suggesting right atrial pressure of 3 mmHg. 5. Conclusion(s)/Recommendation(s): Normal biventricular function  PAST GI PROCEDURES:   Colonoscopy 08/25/2017: - Three 3 to 6 mm polyps in the rectum and in the ascending colon, removed with a cold snare. Resected and retrieved. - One 1 mm polyp in the ascending colon, removed with a cold biopsy forceps. Resected and retrieved. - Diverticulosis in the left colon. - The examination was otherwise normal on direct and retroflexion views. - 3 year colonoscopy recall Surgical [P], rectum and ascending, polyp (4) - TUBULAR ADENOMA (TWO FRAGMENTS). - SESSILE SERRATED POLYP (THREE FRAGMENTS). - NO HIGH GRADE DYSPLASIA OR MALIGNANCY  Colonoscopy 07/28/2006: 1. COLON, BIOPSY: NO ACTIVE COLITIS OR DYSPLASIA IDENTIFIED   (BIOPSIES, RANDOM).    2. COLON, BIOPSY: FOCAL CRYPTITIS AND MUCOSAL EROSION. SEE   COMMENT. (BIOPSIES, SIGMOID).    COMMENT   1. There is some rare inflammation overlying a portion of a   lymphoid aggregate but otherwise no active inflammation is seen.   There is no dysplasia or evidence of malignancy.    2. There is a focus of cryptitis which may indicate minimal   active colitis. The surface epithelium is eroded and there is   some hyperemia. No pseudomembranes or exudate is seen and   otherwise no significant active inflammation is appreciated.   There is no evidence of malignancy. The findings are those of   nonspecific minimal active colitis and clinical correlation is   strongly recommended. The differential diagnosis includes   minimal ischemic colitis or changes secondary to  the use of   non-steroidal anti-inflammatory drugs. I do not see features to   suggest collagenous colitis or lymphocytic colitis. JAS:mw   07/29/06   Past Medical History:  Diagnosis Date   Anxiety and depression 09/08/2011   Female bladder prolapse    GERD (gastroesophageal reflux disease) 09/05/2011   Improved signif with PPI   History of adenomatous polyp of colon 08/2017   Recall 3-5 yrs   History of Clostridium difficile infection 2007   colonic changes c/w with this dx seen on colonoscopy 2007---sx's responded to flagyl.  History of cyst of breast    multiple drained during menopause   Hyperlipidemia    Hypotension 2022   ?idiopathic   IFG (impaired fasting glucose) 06/2018   Fastings 105-115 range when pt checked with husband's glucometer.  Fasting gluc here was 98 and A1c 5.6% 07/01/18.   Insomnia 09/05/2011   Migraine    worst during menopause   Orthostatic hypotension 11/20/2021   Overweight (BMI 25.0-29.9) 09/05/2011   Seasonal allergic rhinitis    Subclinical hypothyroidism    Vertigo, benign positional 09/08/2011   Past Surgical History:  Procedure Laterality Date   CARDIOVASCULAR STRESS TEST  02/15/2015   Exercise myocard perfusion testing: Normal EF, normal wall motion, normal perfusion.  Poor exercise capacity (4 min).   COLONOSCOPY  07/2006; 08/25/17   2007: pseudomembranous colitis, o/w normal. (Dr. Ardis Hughs).  08/2017: adenomatous polyp--recall 3-5 yrs   COSMETIC SURGERY     DEXA  06/19/09; 01/19/2019   Normal 2010 and 2020.   POSTERIOR REPAIR  2012   TONSILLECTOMY  1982   TRANSTHORACIC ECHOCARDIOGRAM  01/06/2022   NORMAL   Transvaginal ultrasound  10/2016   Normal (done for LLQ pain by Dr. Benay Pike).   TUBAL LIGATION  1983   vaginal hernia repair  05/2011   rectocele repair   Social History: She is a retired Presenter, broadcasting.  She reports that she has never smoked. She has never used smokeless tobacco. She reports current alcohol  use. She reports that she does not use drugs.  Family History: family history includes Allergies in her brother and daughter; Aneurysm in her father; Atrial fibrillation in her mother; Breast cancer (age of onset: 74) in her sister; Breast cancer (age of onset: 16) in her maternal grandmother; Cancer in her father and sister; Diabetes in her daughter, sister, and sister; Diabetes (age of onset: 66) in her mother; Heart attack in her maternal grandfather; Heart disease in her paternal grandmother; Hyperlipidemia in her brother, sister, and sister; Hypertension in her sister and sister; Kidney disease in her paternal grandfather; Other in her sister; Pernicious anemia in her father and paternal grandmother; Stroke in her paternal grandfather; Thyroid disease in her sister. No Known Allergies    Outpatient Encounter Medications as of 11/06/2022  Medication Sig   ALPRAZolam (XANAX) 0.25 MG tablet Take 1 tablet (0.25 mg total) by mouth 2 (two) times daily as needed.   aspirin 81 MG tablet Take 81 mg by mouth daily.   Calcium Carbonate-Vitamin D 600-400 MG-UNIT tablet Take 1 tablet by mouth daily.   Cyanocobalamin (VITAMIN B12) 500 MCG TABS    Ferrous Sulfate (IRON) 325 (65 Fe) MG TABS Take by mouth daily.    fludrocortisone (FLORINEF) 0.1 MG tablet Take 1 tablet (100 mcg total) by mouth daily.   fluticasone (FLONASE) 50 MCG/ACT nasal spray Place 2 sprays into both nostrils daily.   HYDROcodone bit-homatropine (HYCODAN) 5-1.5 MG/5ML syrup Take 5 mLs by mouth every 8 (eight) hours as needed for cough.   loratadine (CLARITIN) 10 MG tablet Take 10 mg by mouth daily as needed. Spring and fall   Magnesium 250 MG TABS Take 250 mg by mouth daily.   Melatonin 5 MG CAPS Take 5 mg by mouth.   meloxicam (MOBIC) 7.5 MG tablet TAKE 1 TO 2 TABLETS EVERY DAY AS NEEDED FOR MUSCULOSKELETAL PAIN   METRONIDAZOLE, TOPICAL, 0.75 % LOTN Apply 1 Application topically daily.   midodrine (PROAMATINE) 5 MG tablet Take 5 mg by  mouth 2 (two) times  daily with a meal.   pantoprazole (PROTONIX) 40 MG tablet Take 1 tablet (40 mg total) by mouth daily as needed.   sertraline (ZOLOFT) 100 MG tablet 2 tabs po qhs   simvastatin (ZOCOR) 40 MG tablet TAKE 1 TABLET BY MOUTH DAILY AT 6 PM.   valACYclovir (VALTREX) 1000 MG tablet Take 1 tablet (1,000 mg total) by mouth daily.   No facility-administered encounter medications on file as of 11/06/2022.   REVIEW OF SYSTEMS:  Gen: + Fatigue. Denies fever, sweats or chills. No weight loss.  CV: Denies chest pain, palpitations or edema. Resp: Denies cough, shortness of breath of hemoptysis.  GI: See HPI. GU: + Urine leakage.  MS: + Muscle cramps.  Derm: Denies rash, itchiness, skin lesions or unhealing ulcers. Psych: + Anxiety and depression.  Heme: Denies bruising, easy bleeding. Neuro:  + Headaches.  Endo:  Denies any problems with DM, thyroid or adrenal function.  PHYSICAL EXAM: BP 100/70 (BP Location: Left Arm, Patient Position: Sitting, Cuff Size: Normal)   Pulse 82   Ht '5\' 3"'$  (1.6 m)   Wt 151 lb 3.2 oz (68.6 kg)   SpO2 99%   BMI 26.78 kg/m   General: 71 year old female in no acute distress. Head: Normocephalic and atraumatic. Eyes:  Sclerae non-icteric, conjunctive pink. Ears: Normal auditory acuity. Mouth: Dentition intact. No ulcers or lesions.  Neck: Supple, no lymphadenopathy or thyromegaly.  Lungs: Clear bilaterally to auscultation without wheezes, crackles or rhonchi. Heart: Regular rate and rhythm. No murmur, rub or gallop appreciated.  Abdomen: Soft, nondistended.  Mild tenderness below the umbilicus.  No masses. No hepatosplenomegaly. Normoactive bowel sounds x 4 quadrants.  Rectal: Deferred. Musculoskeletal: Symmetrical with no gross deformities. Skin: Warm and dry. No rash or lesions on visible extremities. Extremities: No edema. Neurological: Alert oriented x 4, no focal deficits.  Psychological:  Alert and cooperative. Normal mood and  affect.  ASSESSMENT AND PLAN:  5) 71 year old female with chronic GERD and dysphagia with liquids and solid foods. On Pantoprazole '40mg'$  once daily. Intermittent melenic stools x 3 to 4 years. Chronic Meloxicam use.  On oral iron.  No Pepto-Bismol use.  Never had an EGD. -EGD benefits and risks discussed including risk with sedation, risk of bleeding, perforation and infection  -Continue Pantoprazole 40 mg once daily -Famotidine '20mg'$  one tab po QD PRN  2) Constipation  -Drink 64 ounces of water daily -Benefiber 1 tablespoon daily -MiraLAX nightly  3) History of sessile serrated and tubular adenomatous polyps per colonoscopy 08/2017  4) Rectal bleeding, chronic/intermittent -See Plan in # 2 and # 3  5) Reported history of IDA, initially thought to be related to blood donations. On oral iron x 5 years.  -EGD and colonoscopy as ordered above -CBC, IBC + Ferritin   6) History of orthostatic hypotension, followed by cardiology.  Cortisol and ACTH were within normal limits.  She is on Midodrine 5 mg 3 times daily and Florinef 0.1 mg daily.  Echo 01/06/2022 showed LVEF 55 to 60% with normal RV function. -Patient to contact cardiologist Dr. Skeet Latch to verify midodrine and Florinef doses prior to EGD/colonoscopy prep date and procedure date  Further recommendations to be determined after the above evaluation completed       CC:  McGowen, Adrian Blackwater, MD

## 2022-11-06 NOTE — Patient Instructions (Addendum)
You have been scheduled for an endoscopy and colonoscopy. Please follow the written instructions given to you at your visit today. Please pick up your prep supplies at the pharmacy within the next 1-3 days. If you use inhalers (even only as needed), please bring them with you on the day of your procedure.   Your provider has requested that you go to the basement level for lab work before leaving today. Press "B" on the elevator. The lab is located at the first door on the left as you exit the elevator.   Miralax- at bedtime as needed.  Pepcid 20 mg (over the counter)- take 1 by mouth daily as needed.  Contact your cardiologist regarding Florinef and Midodrine doses prior to EGD & Colonoscopy.  Due to recent changes in healthcare laws, you may see the results of your imaging and laboratory studies on MyChart before your provider has had a chance to review them.  We understand that in some cases there may be results that are confusing or concerning to you. Not all laboratory results come back in the same time frame and the provider may be waiting for multiple results in order to interpret others.  Please give [place] 48 hours in order for your provider to thoroughly review all the results before contacting the office for clarification of your results.    Thank you for trusting me with your gastrointestinal care!   Carl Best, CRNP

## 2022-11-06 NOTE — Progress Notes (Signed)
Barbara Munoz, pls contact patient and have her hold her oral iron tab for one week prior to her EGD/colonoscopy date as it can reduce the effectiveness of the bowel prep clean out. She can restart the iron post procedure and follow up with PCP to determine if she needs oral iron long term. Further GI recommendations to be determined after EGD and colonoscopy completed. THX.

## 2022-11-07 ENCOUNTER — Telehealth: Payer: Self-pay

## 2022-11-07 ENCOUNTER — Other Ambulatory Visit: Payer: Self-pay

## 2022-11-07 MED ORDER — CLENPIQ 10-3.5-12 MG-GM -GM/160ML PO SOLN: 1.0000 | ORAL | 0 refills | Status: DC

## 2022-11-07 NOTE — Telephone Encounter (Signed)
Noralyn Pick, NP  Gillermina Hu, RN   Remo Lipps, pls contact patient and have her hold her oral iron tab for one week prior to her EGD/colonoscopy date as it can reduce the effectiveness of the bowel prep clean out. She can restart the iron post procedure and follow up with PCP to determine if she needs oral iron long term. Further GI recommendations to be determined after EGD and colonoscopy completed. THX.

## 2022-11-07 NOTE — Telephone Encounter (Signed)
Spoke with patient regarding NP recommendations. She also mentioned that her suprep was denied by insurance. Clenpiq sent in to pharmacy & updated instructions sent to mychart. Pt verbalized all understanding.

## 2022-11-15 ENCOUNTER — Other Ambulatory Visit: Payer: Self-pay | Admitting: Family Medicine

## 2022-11-17 NOTE — Telephone Encounter (Signed)
Not prescribed by you. Please advise.

## 2022-11-21 ENCOUNTER — Encounter: Payer: Self-pay | Admitting: Family Medicine

## 2022-11-21 ENCOUNTER — Ambulatory Visit: Payer: Medicare PPO | Admitting: Family Medicine

## 2022-11-21 VITALS — BP 118/57 | HR 75 | Temp 98.0°F | Wt 151.0 lb

## 2022-11-21 DIAGNOSIS — E7800 Pure hypercholesterolemia, unspecified: Secondary | ICD-10-CM

## 2022-11-21 DIAGNOSIS — F411 Generalized anxiety disorder: Secondary | ICD-10-CM

## 2022-11-21 DIAGNOSIS — F3341 Major depressive disorder, recurrent, in partial remission: Secondary | ICD-10-CM

## 2022-11-21 DIAGNOSIS — I950 Idiopathic hypotension: Secondary | ICD-10-CM

## 2022-11-21 NOTE — Progress Notes (Signed)
OFFICE VISIT  11/21/2022  CC:  Chief Complaint  Patient presents with   Depression    Patient is a 71 y.o. female who presents for 44-monthfollow-up idiopathic hypotension as well as chronic anxiety and recurrent depression. A/P as of last visit: "#1 COVID respiratory infection.  Her symptoms are lingering quite a bit. We will treat for secondary bacterial sinusitis/bronchitis. Doxycycline 100 twice daily for 7 days. Hycodan syrup, 120 mL.   #2 major depressive disorder, general anxiety. She is fairly stable and we will make no changes today.  Continue sertraline 200 mg daily and alprazolam 0.25 mg twice daily as needed.   3 idiopathic hypotension.  Stable on midodrine 5 mg twice daily and Florinef 0.1 g daily. No labs today"  INTERIM HX: [employer] doing pretty well.  Home blood pressures acceptably low, minimal problems with acute generalized weakness or dizziness.  She is having some abdominal bloating and alternating loose stools with constipation.  Has stopped all supplements and this has not helped anything.  No blood in stool, no melena, no fever or weight loss.  Labs 11/06/2022:  cbc, iron panel, cmet all normal (GI-->they are planning EGD and colonoscopy for f/u colon polyp and GERD with dyphagia).  Mood is stable, anxiety levels high due to the stressful care that her husband requires. PMP AWARE reviewed today: most recent rx for alprazolam was filled 08/22/2022, #60, rx by me. No red flags.  ROS as above, plus--> no CP, no SOB, no wheezing, no cough,no HAs, no rashes, no melena/hematochezia.  No polyuria or polydipsia.  No myalgias or arthralgias.  No focal weakness, paresthesias, or tremors.  No acute vision or hearing abnormalities.  No dysuria or unusual/new urinary urgency or frequency.  No recent changes in lower legs. No n/v. No palpitations.    Past Medical History:  Diagnosis Date   Anxiety and depression 09/08/2011   Female bladder prolapse    GERD  (gastroesophageal reflux disease) 09/05/2011   Improved signif with PPI   History of adenomatous polyp of colon 08/2017   Recall 3-5 yrs   History of Clostridium difficile infection 2007   colonic changes c/w with this dx seen on colonoscopy 2007---sx's responded to flagyl.   History of cyst of breast    multiple drained during menopause   Hyperlipidemia    Hypotension 2022   ?idiopathic   IFG (impaired fasting glucose) 06/2018   Fastings 105-115 range when pt checked with husband's glucometer.  Fasting gluc here was 98 and A1c 5.6% 07/01/18.   Insomnia 09/05/2011   Migraine    worst during menopause   Orthostatic hypotension 11/20/2021   Overweight (BMI 25.0-29.9) 09/05/2011   Seasonal allergic rhinitis    Subclinical hypothyroidism    Vertigo, benign positional 09/08/2011    Past Surgical History:  Procedure Laterality Date   CARDIOVASCULAR STRESS TEST  02/15/2015   Exercise myocard perfusion testing: Normal EF, normal wall motion, normal perfusion.  Poor exercise capacity (4 min).   COLONOSCOPY  07/2006; 08/25/17   2007: pseudomembranous colitis, o/w normal. (Dr. JArdis Hughs.  08/2017: adenomatous polyp--recall 3-5 yrs   COSMETIC SURGERY     DEXA  06/19/09; 01/19/2019   Normal 2010 and 2020.   POSTERIOR REPAIR  2012   TONSILLECTOMY  1982   TRANSTHORACIC ECHOCARDIOGRAM  01/06/2022   NORMAL   Transvaginal ultrasound  10/2016   Normal (done for LLQ pain by Dr. RBenay Pike.   TUBAL LIGATION  1983   vaginal hernia repair  05/2011  rectocele repair    Outpatient Medications Prior to Visit  Medication Sig Dispense Refill   ALPRAZolam (XANAX) 0.25 MG tablet Take 1 tablet (0.25 mg total) by mouth 2 (two) times daily as needed. 90 tablet 1   aspirin 81 MG tablet Take 81 mg by mouth daily.     fludrocortisone (FLORINEF) 0.1 MG tablet Take 1 tablet (100 mcg total) by mouth daily. 30 tablet 2   fluticasone (FLONASE) 50 MCG/ACT nasal spray Place 2 sprays into both nostrils daily. 16 g 6    HYDROcodone bit-homatropine (HYCODAN) 5-1.5 MG/5ML syrup Take 5 mLs by mouth every 8 (eight) hours as needed for cough. 120 mL 0   loratadine (CLARITIN) 10 MG tablet Take 10 mg by mouth daily as needed. Spring and fall     Melatonin 5 MG CAPS Take 5 mg by mouth.     meloxicam (MOBIC) 7.5 MG tablet TAKE 1 TO 2 TABLETS EVERY DAY AS NEEDED FOR MUSCULOSKELETAL PAIN 60 tablet 2   METRONIDAZOLE, TOPICAL, 0.75 % LOTN Apply 1 Application topically daily. 59 mL 0   midodrine (PROAMATINE) 5 MG tablet TAKE 1 TABLET (5 MG TOTAL) BY MOUTH 3 (THREE) TIMES DAILY WITH MEALS. 270 tablet 3   pantoprazole (PROTONIX) 40 MG tablet Take 1 tablet (40 mg total) by mouth daily as needed. 90 tablet 3   sertraline (ZOLOFT) 100 MG tablet 2 tabs po qhs 180 tablet 3   simvastatin (ZOCOR) 40 MG tablet TAKE 1 TABLET BY MOUTH DAILY AT 6 PM. 90 tablet 3   Sod Picosulfate-Mag Ox-Cit Acd (CLENPIQ) 10-3.5-12 MG-GM -GM/160ML SOLN Take 1 kit by mouth as directed. 320 mL 0   valACYclovir (VALTREX) 1000 MG tablet Take 1 tablet (1,000 mg total) by mouth daily. 10 tablet 1   Calcium Carbonate-Vitamin D 600-400 MG-UNIT tablet Take 1 tablet by mouth daily. (Patient not taking: Reported on 11/21/2022)     Cyanocobalamin (VITAMIN B12) 500 MCG TABS  (Patient not taking: Reported on 11/21/2022)     Ferrous Sulfate (IRON) 325 (65 Fe) MG TABS Take by mouth daily.  (Patient not taking: Reported on 11/21/2022)     Magnesium 250 MG TABS Take 250 mg by mouth daily. (Patient not taking: Reported on 11/21/2022)     No facility-administered medications prior to visit.    No Known Allergies  ROS As per HPI  PE:    11/21/2022    1:30 PM 11/06/2022    1:43 PM 08/22/2022    1:37 PM  Vitals with BMI  Height  '5\' 3"'$  '5\' 3"'$   Weight 151 lbs 151 lbs 3 oz 148 lbs 10 oz  BMI 26.76 [id number]  Systolic 123456 [id number] 95  Diastolic 57 70 60  Pulse 75 82 75     Physical Exam  Gen: Alert, well appearing.  Patient is oriented to person, place, time, and  situation. AFFECT: pleasant, lucid thought and speech. No further exam today  LABS:  Last CBC Lab Results  Component Value Date   WBC 7.0 11/06/2022   HGB 14.7 11/06/2022   HCT 43.2 11/06/2022   MCV 93.2 11/06/2022   MCH 31.8 05/17/2021   RDW 13.5 11/06/2022   PLT 175.0 11/06/2022   Lab Results  Component Value Date   IRON 90 11/06/2022   TIBC 326.2 11/06/2022   FERRITIN 236.0 [id number]   Last metabolic panel Lab Results  Component Value Date   GLUCOSE 90 11/06/2022   NA 137 11/06/2022   K 4.7 11/06/2022   CL  101 11/06/2022   CO2 32 11/06/2022   BUN 13 11/06/2022   CREATININE 0.72 11/06/2022   GFRNONAA >60 05/17/2021   CALCIUM 9.6 11/06/2022   PHOS 3.0 11/09/2012   PROT 7.7 11/06/2022   ALBUMIN 4.7 11/06/2022   BILITOT 0.4 11/06/2022   ALKPHOS 57 11/06/2022   AST 34 11/06/2022   ALT 38 (H) 11/06/2022   ANIONGAP 7 05/17/2021   Last lipids Lab Results  Component Value Date   CHOL 188 02/10/2022   HDL 48.90 02/10/2022   LDLCALC 105 (H) 02/10/2022   LDLDIRECT 110.6 10/26/2013   TRIG 170.0 (H) 02/10/2022   CHOLHDL 4 02/10/2022   Last hemoglobin A1c Lab Results  Component Value Date   HGBA1C 5.4 11/08/2019   Last thyroid functions Lab Results  Component Value Date   TSH 2.37 02/10/2022   T3TOTAL 112.4 06/30/2017   Last vitamin B12 and Folate Lab Results  Component Value Date   VITAMINB12 1,077 (H) 02/10/2022   IMPRESSION AND PLAN:  #1 idiopathic hypotension, stable on Florinef 0.1 mg daily and midodrine 5 mg 3 times daily.  #2 chronic anxiety, recurrent depression. Stable on sertraline 200 mg nightly and alprazolam 0.25 mg twice daily as needed.  #3 hyperlipidemia, doing well on Zocor 40 mg a day. Lipid panel at next f/u in 3 mo.  An After Visit Summary was printed and given to the patient.  FOLLOW UP: No follow-ups on file. Next CPE 01/2023  Signed:  Crissie Sickles, MD           11/21/2022

## 2022-11-27 NOTE — Telephone Encounter (Signed)
A user error has taken place: encounter opened in error, closed for administrative reasons.

## 2022-12-01 ENCOUNTER — Ambulatory Visit (HOSPITAL_BASED_OUTPATIENT_CLINIC_OR_DEPARTMENT_OTHER): Payer: Medicare PPO | Admitting: Cardiovascular Disease

## 2022-12-01 ENCOUNTER — Encounter (HOSPITAL_BASED_OUTPATIENT_CLINIC_OR_DEPARTMENT_OTHER): Payer: Self-pay | Admitting: Cardiovascular Disease

## 2022-12-01 VITALS — BP 118/70 | HR 83 | Ht 63.0 in | Wt 153.6 lb

## 2022-12-01 DIAGNOSIS — I951 Orthostatic hypotension: Secondary | ICD-10-CM

## 2022-12-01 DIAGNOSIS — E7800 Pure hypercholesterolemia, unspecified: Secondary | ICD-10-CM | POA: Diagnosis not present

## 2022-12-01 NOTE — Patient Instructions (Signed)
Medication Instructions:  Your physician recommends that you continue on your current medications as directed. Please refer to the Current Medication list given to you today.   *If you need a refill on your cardiac medications before your next appointment, please call your pharmacy*  Lab Work: NONE  Testing/Procedures: CALCIUM SCORE - THIS WILL COST $99 OUT OF POCKET   Follow-Up: At [facility], you and your health needs are our priority.  As part of our continuing mission to provide you with exceptional heart care, we have created designated Provider Care Teams.  These Care Teams include your primary Cardiologist (physician) and Advanced Practice Providers (APPs -  Physician Assistants and Nurse Practitioners) who all work together to provide you with the care you need, when you need it.  We recommend signing up for the patient portal called "MyChart".  Sign up information is provided on this After Visit Summary.  MyChart is used to connect with patients for Virtual Visits (Telemedicine).  Patients are able to view lab/test results, encounter notes, upcoming appointments, etc.  Non-urgent messages can be sent to your provider as well.   To learn more about what you can do with MyChart, go to [email or web address].    Your next appointment:   12 month(s)  The format for your next appointment:   In Person  Provider:   Skeet Latch, MD

## 2022-12-01 NOTE — Assessment & Plan Note (Signed)
Blood pressures have been much better controlled.  Mostly in the 110s-120s/70s.  She had one day of low BP, but overall has been stable.

## 2022-12-01 NOTE — Assessment & Plan Note (Addendum)
Lipids are borderline.  She is interested in getting a coronary calcium score.  Continue aspirin for now, but may need to hold it after these results.  Continue simvastatin.

## 2022-12-01 NOTE — Progress Notes (Signed)
Cardiology Office Note:    Date:  12/01/2022   ID:  Barbara Munoz, DOB Apr 24, 1951, MRN [id number]  PCP:  Tammi Sou, MD  Cardiologist:  Skeet Latch, MD    Referring MD: Tammi Sou, MD   No chief complaint on file.    History of Present Illness:    Barbara Munoz is a 71 y.o. female with a hx of hypotension, hyperlipidemia, GERD, anxiety, and obesity here today for follow up. She was first seen  10/10/2021 for orthostatic hypotension. She last saw Dr. Anitra Lauth 10/24/2021 and complained of continued orthostatic hypotension with associated fatigue. Her condition was improved with florinef and midodrine. She was referred to cardiology. Cortisol and ACTH were within normal limits.   At the last visit she was doing well and Midodrine was reduced to BID. She was struggling with stress and anxiety and we encouraged her to follow up with her therapist and continue with medication.  Today, she states that she has been doing well. She notes only 1 episode wherein her BP dropped into the 80s/60s for a day. She drank Pedialyte and rested with improvement of her fatigue and pressures. Overall her pressures have been well controlled around 259-563O systolic over 75I diastolic. She has been staying active but does not have a formal exercise regimen. She reports that she painted her whole house over the course of several months and has been doing yard work with heavy lifting and digging. She states that when she is active she tolerates this well as long as she takes regular breaks.  The patient denies chest pain, chest pressure, dyspnea at rest or with exertion, PND, orthopnea, or leg swelling. Denies cough, fever, chills, nausea, or vomiting. Denies syncope, presyncope, or snoring.  She states that she is planning to get an EGD and colonoscopy in January 2024. She was told that she would need to temporarily discontinue some of her medications prior to the procedure, which prompted her to  trial discontinuing all of her supplements. However, she has been compliant with all of her prescriptions. She reports having chronic problems with gas and abdominal bloating. However, her reflux and bowel movements have normalized since stopping the supplements.   She reports that often feels dehydrated with chapped lips and dry hands. However, she often drinks over 60-70oz of fluids a day with at least half of that being water. She wonders if any of her medications are causing this.  She notes that one of her younger sisters is their mother's primary caregiver now.    Past Medical History:  Diagnosis Date   Anxiety and depression 09/08/2011   Female bladder prolapse    GERD (gastroesophageal reflux disease) 09/05/2011   Improved signif with PPI   History of adenomatous polyp of colon 08/2017   Recall 3-5 yrs   History of Clostridium difficile infection 2007   colonic changes c/w with this dx seen on colonoscopy 2007---sx's responded to flagyl.   History of cyst of breast    multiple drained during menopause   Hyperlipidemia    Hypotension 2022   ?idiopathic   IFG (impaired fasting glucose) 06/2018   Fastings 105-115 range when pt checked with husband's glucometer.  Fasting gluc here was 98 and A1c 5.6% 07/01/18.   Insomnia 09/05/2011   Migraine    worst during menopause   Orthostatic hypotension 11/20/2021   Overweight (BMI 25.0-29.9) 09/05/2011   Seasonal allergic rhinitis    Subclinical hypothyroidism    Vertigo, benign positional 09/08/2011  Past Surgical History:  Procedure Laterality Date   CARDIOVASCULAR STRESS TEST  02/15/2015   Exercise myocard perfusion testing: Normal EF, normal wall motion, normal perfusion.  Poor exercise capacity (4 min).   COLONOSCOPY  07/2006; 08/25/17   2007: pseudomembranous colitis, o/w normal. (Dr. Ardis Hughs).  08/2017: adenomatous polyp--recall 3-5 yrs   COSMETIC SURGERY     DEXA  06/19/09; 01/19/2019   Normal 2010 and 2020.   POSTERIOR  REPAIR  2012   TONSILLECTOMY  1982   TRANSTHORACIC ECHOCARDIOGRAM  01/06/2022   NORMAL   Transvaginal ultrasound  10/2016   Normal (done for LLQ pain by Dr. Benay Pike).   TUBAL LIGATION  1983   vaginal hernia repair  05/2011   rectocele repair    Current Medications: Current Meds  Medication Sig   ALPRAZolam (XANAX) 0.25 MG tablet Take 1 tablet (0.25 mg total) by mouth 2 (two) times daily as needed.   aspirin 81 MG tablet Take 81 mg by mouth daily.   fludrocortisone (FLORINEF) 0.1 MG tablet Take 1 tablet (100 mcg total) by mouth daily.   fluticasone (FLONASE) 50 MCG/ACT nasal spray Place 2 sprays into both nostrils daily.   loratadine (CLARITIN) 10 MG tablet Take 10 mg by mouth daily as needed. Spring and fall   Melatonin 5 MG CAPS Take 5 mg by mouth as needed.   meloxicam (MOBIC) 7.5 MG tablet TAKE 1 TO 2 TABLETS EVERY DAY AS NEEDED FOR MUSCULOSKELETAL PAIN   METRONIDAZOLE, TOPICAL, 0.75 % LOTN Apply 1 Application topically daily. (Patient taking differently: Apply 1 Application topically as needed.)   midodrine (PROAMATINE) 5 MG tablet TAKE 1 TABLET (5 MG TOTAL) BY MOUTH 3 (THREE) TIMES DAILY WITH MEALS.   pantoprazole (PROTONIX) 40 MG tablet Take 1 tablet (40 mg total) by mouth daily as needed.   sertraline (ZOLOFT) 100 MG tablet 2 tabs po qhs   simvastatin (ZOCOR) 40 MG tablet TAKE 1 TABLET BY MOUTH DAILY AT 6 PM.   Sod Picosulfate-Mag Ox-Cit Acd (CLENPIQ) 10-3.5-12 MG-GM -GM/160ML SOLN Take 1 kit by mouth as directed.   valACYclovir (VALTREX) 1000 MG tablet Take 1 tablet (1,000 mg total) by mouth daily.     Allergies:   Patient has no known allergies.   Social History   Socioeconomic History   Marital status: Married    Spouse name: Not on file   Number of children: Not on file   Years of education: Not on file   Highest education level: Master's degree (e.g., MA, MS, MEng, MEd, MSW, MBA)  Occupational History   Not on file  Tobacco Use   Smoking status: Never    Smokeless tobacco: Never   Tobacco comments:    smoked for about 6 months  Vaping Use   Vaping Use: Never used  Substance and Sexual Activity   Alcohol use: Yes    Comment: moderate use   Drug use: No   Sexual activity: Yes    Birth control/protection: Post-menopausal  Other Topics Concern   Not on file  Social History Narrative   Married.   Nonsmoker.   Social Determinants of Health   Financial Resource Strain: Low Risk  (04/23/2022)   Overall Financial Resource Strain (CARDIA)    Difficulty of Paying Living Expenses: Not hard at all  Food Insecurity: No Food Insecurity (08/22/2022)   Hunger Vital Sign    Worried About Running Out of Food in the Last Year: Never true    Ran Out of Food in the Last Year:  Never true  Transportation Needs: No Transportation Needs (08/22/2022)   PRAPARE - Hydrologist (Medical): No    Lack of Transportation (Non-Medical): No  Physical Activity: Insufficiently Active (08/22/2022)   Exercise Vital Sign    Days of Exercise per Week: 3 days    Minutes of Exercise per Session: 30 min  Stress: Stress Concern Present (08/22/2022)   [place]    Feeling of Stress : To some extent  Social Connections: Moderately Integrated (08/22/2022)   Social Connection and Isolation Panel [NHANES]    Frequency of Communication with Friends and Family: More than three times a week    Frequency of Social Gatherings with Friends and Family: Once a week    Attends Religious Services: More than 4 times per year    Active Member of [employer] or Organizations: No    Attends Archivist Meetings: Never    Marital Status: Married     Family History: The patient's family history includes Allergies in her brother and daughter; Aneurysm in her father; Atrial fibrillation in her mother; Breast cancer (age of onset: 36) in her sister; Breast cancer (age of onset: 70) in her maternal  grandmother; Cancer in her father and sister; Diabetes in her daughter, sister, and sister; Diabetes (age of onset: 52) in her mother; Heart attack in her maternal grandfather; Heart disease in her paternal grandmother; Hyperlipidemia in her brother, sister, and sister; Hypertension in her sister and sister; Kidney disease in her paternal grandfather; Other in her sister; Pernicious anemia in her father and paternal grandmother; Stroke in her paternal grandfather; Thyroid disease in her sister. There is no history of Colon cancer, Esophageal cancer, Stomach cancer, or Rectal cancer.  ROS:   Please see the history of present illness.   + Dry skin All other systems reviewed and negative.   EKGs/Labs/Other Studies Reviewed:    The following studies were reviewed today:  Echo 01/06/2022: 1.Left ventricular ejection fraction, by estimation, is 55 to 60%. Left ventricular ejection fraction by 2D MOD biplane is 58.6 %. The left ventricle has normal function. The left ventricle has no regional wall motion abnormalities. Left ventricular diastolic parameters were normal. 2.Right ventricular systolic function is normal. The right ventricular size is normal. Tricuspid regurgitation signal is inadequate for assessing PA pressure. 3.The mitral valve is grossly normal. Mild mitral valve regurgitation. No evidence of mitral stenosis. 4.The aortic valve is tricuspid. Aortic valve regurgitation is not visualized. No aortic stenosis is present. 5.The inferior vena cava is normal in size with greater than 50% respiratory variability, suggesting right atrial pressure of 3 mmHg. 6.Conclusion(s)/Recommendation(s): Normal biventricular function  Myocardial Imaging 02/16/15 Showed NO ABNORMALITIES of her heart  EKG:  EKG ordered today, 12/01/22.  The ekg ordered today demonstrates NSR rate of 83bpm.  11/20/21: Sinus rhythm, rate 68 bpm  Recent Labs: 02/10/2022: TSH 2.37 11/06/2022: ALT 38; BUN 13; Creatinine,  Ser 0.72; Hemoglobin 14.7; Platelets 175.0; Potassium 4.7; Sodium 137   Recent Lipid Panel    Component Value Date/Time   CHOL 188 02/10/2022 0951   TRIG 170.0 (H) 02/10/2022 0951   HDL 48.90 02/10/2022 0951   CHOLHDL 4 02/10/2022 0951   VLDL 34.0 02/10/2022 0951   LDLCALC 105 (H) 02/10/2022 0951   LDLDIRECT 110.6 10/26/2013 0903    CHA2DS2-VASc Score =   _0 .  Therefore, the patient's annual risk of stroke is   %.  Physical Exam:    VS:  BP 118/70 (BP Location: Left Arm, Patient Position: Sitting, Cuff Size: Normal)   Pulse 83   Ht _0  (1.6 m)   Wt 153 lb 9.6 oz (69.7 kg)   BMI 27.21 kg/m  , BMI Body mass index is 27.21 kg/m. GENERAL:  Well appearing HEENT: Pupils equal round and reactive, fundi not visualized, oral mucosa unremarkable NECK:  No jugular venous distention, waveform within normal limits, carotid upstroke brisk and symmetric, no bruits, no thyromegaly LUNGS:  Clear to auscultation bilaterally HEART:  RRR.  PMI not displaced or sustained,S1 and S2 within normal limits, no S3, no S4, no clicks, no rubs, no murmurs ABD:  Flat, positive bowel sounds normal in frequency in pitch, no bruits, no rebound, no guarding, no midline pulsatile mass, no hepatomegaly, no splenomegaly EXT:  2 plus pulses throughout, no edema, no cyanosis no clubbing SKIN:  No rashes no nodules NEURO:  Cranial nerves II through XII grossly intact, motor grossly intact throughout PSYCH:  Cognitively intact, oriented to person place and time  ASSESSMENT:    1. Orthostatic hypotension   2. Pure hypercholesterolemia     PLAN:    Orthostatic hypotension Blood pressures have been much better controlled.  Mostly in the 110s-120s/70s.  She had one day of low BP, but overall has been stable.   Hyperlipidemia Lipids are borderline.  She is interested in getting a coronary calcium score.  Continue aspirin for now, but may need to hold it after these results.  Continue simvastatin.      In order of problems listed above:  Medication Adjustments/Labs and Tests Ordered: Current medicines are reviewed at length with the patient today.  Concerns regarding medicines are outlined above.  Orders Placed This Encounter  Procedures   CT CARDIAC SCORING (SELF PAY ONLY)   EKG 12-Lead    No orders of the defined types were placed in this encounter.   Disposition:   I,Alexis Herring,acting as a scribe for Skeet Latch, MD.,have documented all relevant documentation on the behalf of Skeet Latch, MD,as directed by  Skeet Latch, MD while in the presence of Skeet Latch, MD.  I, Byron Oval Linsey, MD have reviewed all documentation for this visit.  The documentation of the exam, diagnosis, procedures, and orders on 12/01/2022 are all accurate and complete.   Signed, Skeet Latch, MD  12/01/2022 3:38 PM    [place]

## 2022-12-03 ENCOUNTER — Other Ambulatory Visit: Payer: Self-pay | Admitting: Family Medicine

## 2022-12-26 ENCOUNTER — Encounter (HOSPITAL_BASED_OUTPATIENT_CLINIC_OR_DEPARTMENT_OTHER): Payer: Self-pay | Admitting: Cardiovascular Disease

## 2022-12-26 ENCOUNTER — Encounter: Payer: Self-pay | Admitting: Gastroenterology

## 2022-12-26 NOTE — Telephone Encounter (Signed)
Please advise, MD not in office

## 2022-12-30 ENCOUNTER — Encounter: Payer: Self-pay | Admitting: Certified Registered Nurse Anesthetist

## 2023-01-02 ENCOUNTER — Ambulatory Visit (AMBULATORY_SURGERY_CENTER): Payer: Medicare PPO | Admitting: Gastroenterology

## 2023-01-02 ENCOUNTER — Encounter: Payer: Self-pay | Admitting: Gastroenterology

## 2023-01-02 VITALS — BP 114/48 | HR 76 | Temp 97.3°F | Resp 12 | Ht 63.0 in | Wt 151.0 lb

## 2023-01-02 DIAGNOSIS — K5730 Diverticulosis of large intestine without perforation or abscess without bleeding: Secondary | ICD-10-CM | POA: Diagnosis not present

## 2023-01-02 DIAGNOSIS — D122 Benign neoplasm of ascending colon: Secondary | ICD-10-CM

## 2023-01-02 DIAGNOSIS — K5520 Angiodysplasia of colon without hemorrhage: Secondary | ICD-10-CM | POA: Diagnosis not present

## 2023-01-02 DIAGNOSIS — D128 Benign neoplasm of rectum: Secondary | ICD-10-CM | POA: Diagnosis not present

## 2023-01-02 DIAGNOSIS — Z86010 Personal history of colonic polyps: Secondary | ICD-10-CM

## 2023-01-02 DIAGNOSIS — K219 Gastro-esophageal reflux disease without esophagitis: Secondary | ICD-10-CM | POA: Diagnosis not present

## 2023-01-02 DIAGNOSIS — K6289 Other specified diseases of anus and rectum: Secondary | ICD-10-CM | POA: Diagnosis not present

## 2023-01-02 DIAGNOSIS — D123 Benign neoplasm of transverse colon: Secondary | ICD-10-CM

## 2023-01-02 DIAGNOSIS — Z09 Encounter for follow-up examination after completed treatment for conditions other than malignant neoplasm: Secondary | ICD-10-CM | POA: Diagnosis not present

## 2023-01-02 DIAGNOSIS — D120 Benign neoplasm of cecum: Secondary | ICD-10-CM

## 2023-01-02 DIAGNOSIS — K449 Diaphragmatic hernia without obstruction or gangrene: Secondary | ICD-10-CM

## 2023-01-02 DIAGNOSIS — D509 Iron deficiency anemia, unspecified: Secondary | ICD-10-CM

## 2023-01-02 DIAGNOSIS — K31819 Angiodysplasia of stomach and duodenum without bleeding: Secondary | ICD-10-CM | POA: Diagnosis not present

## 2023-01-02 MED ORDER — SODIUM CHLORIDE 0.9 % IV SOLN: 500.0000 mL | Freq: Once | INTRAVENOUS | Status: DC

## 2023-01-02 NOTE — Progress Notes (Signed)
Called to room to assist during endoscopic procedure.  Patient ID and intended procedure confirmed with present staff. Received instructions for my participation in the procedure from the performing physician.

## 2023-01-02 NOTE — Progress Notes (Signed)
Pt's states no medical or surgical changes since previsit or office visit. 

## 2023-01-02 NOTE — Progress Notes (Signed)
1032 Robinul 0.1 mg IV given due large amount of secretions upon assessment.  MD made aware, vss

## 2023-01-02 NOTE — Progress Notes (Signed)
1055 Patient experiencing nausea and requested Zofran during case.  MD updated and Zofran 4 mg IV given, vss

## 2023-01-02 NOTE — Op Note (Signed)
[place] Patient Name: Maribell Demeo Procedure Date: 01/02/2023 10:29 AM MRN: [id number] Endoscopist: Remo Lipps P. Havery Moros , MD, [id number] Age: 72 Referring MD:  Date of Birth: [birth year] Gender: Female Account #: [id number] Procedure:                Upper GI endoscopy Indications:              history of gastro-esophageal reflux disease on PPI                            as needed, remote history of iron deficiency                            thought to be due to blood donation - recent Hgb                            and iron studies normal Medicines:                Monitored Anesthesia Care Procedure:                Pre-Anesthesia Assessment:                           - Prior to the procedure, a History and Physical                            was performed, and patient medications and                            allergies were reviewed. The patient's tolerance of                            previous anesthesia was also reviewed. The risks                            and benefits of the procedure and the sedation                            options and risks were discussed with the patient.                            All questions were answered, and informed consent                            was obtained. Prior Anticoagulants: The patient has                            taken no anticoagulant or antiplatelet agents. ASA                            Grade Assessment: II - A patient with mild systemic                            disease. After reviewing the risks and benefits,  the patient was deemed in satisfactory condition to                            undergo the procedure.                           After obtaining informed consent, the endoscope was                            passed under direct vision. Throughout the                            procedure, the patient's blood pressure, pulse, and                            oxygen saturations were  monitored continuously. The                            Endoscope was introduced through the mouth, and                            advanced to the second part of duodenum. The upper                            GI endoscopy was accomplished without difficulty.                            The patient tolerated the procedure well. Scope In: Scope Out: Findings:                 Esophagogastric landmarks were identified: the                            Z-line was found at 36 cm, the gastroesophageal                            junction was found at 37 cm and the upper extent of                            the gastric folds was found at 38 cm from the                            incisors.                           A 1 cm hiatal hernia was present.                           The Z-line was irregular with a 1cm tongue of                            salmon colored mucosa, and was found 36 cm from the  incisors. Biopsies were taken with a cold forceps                            for histology.                           The exam of the esophagus was otherwise normal.                           The entire examined stomach was normal. Biopsies                            were taken with a cold forceps for Helicobacter                            pylori testing.                           A single diminutive angiodysplastic lesion was                            found in the second portion of the duodenum.                           The exam of the duodenum was otherwise normal. Complications:            No immediate complications. Estimated blood loss:                            Minimal. Estimated Blood Loss:     Estimated blood loss was minimal. Impression:               - Esophagogastric landmarks identified.                           - 1 cm hiatal hernia.                           - Z-line irregular, 36 cm from the incisors.                            Biopsied.                            - Normal esophagus otherwise.                           - Normal stomach. Biopsied.                           - A single angiodysplastic lesion in the duodenum.                           - Normal duodenum otherwise. Recommendation:           - Patient has a contact number available for  emergencies. The signs and symptoms of potential                            delayed complications were discussed with the                            patient. Return to normal activities tomorrow.                            Written discharge instructions were provided to the                            patient.                           - Resume previous diet.                           - Continue present medications including protonix                           - Await pathology results. Remo Lipps P. Obdulio Mash, MD 01/02/2023 11:37:00 AM This report has been signed electronically.

## 2023-01-02 NOTE — Progress Notes (Signed)
1113 Ephedrine 10 mg given IV due to low BP, MD updated.

## 2023-01-02 NOTE — Progress Notes (Signed)
[facility] History and Physical   Primary Care Physician:  Barbara Sou, MD   Reason for Procedure:   History of iron deficiency, history of colon polyps, GERD   Plan:    EGD and colonoscopy     HPI: Barbara Munoz is a 72 y.o. female  here for EGD and colonoscopy to further evaluate issues as above. No prior EGD. Colonoscopy last done 08/2017 with 4 polyps. She is on protonix.   No family history of colon cancer known. Otherwise feels well without any cardiopulmonary symptoms. Denies much dysphagia at all. She thinks IDA in the past was due to blood donation but never had it evaluated endoscopically. Recent labs look good on oral iron therapy.  I have discussed risks / benefits of anesthesia and endoscopic procedure with Barbara Munoz and they wish to proceed with the exams as outlined today.    Past Medical History:  Diagnosis Date   Anxiety and depression 09/08/2011   Female bladder prolapse    GERD (gastroesophageal reflux disease) 09/05/2011   Improved signif with PPI   History of adenomatous polyp of colon 08/2017   Recall 3-5 yrs   History of Clostridium difficile infection 2007   colonic changes c/w with this dx seen on colonoscopy 2007---sx's responded to flagyl.   History of cyst of breast    multiple drained during menopause   Hyperlipidemia    Hypotension 2022   ?idiopathic   IFG (impaired fasting glucose) 06/2018   Fastings 105-115 range when pt checked with husband's glucometer.  Fasting gluc here was 98 and A1c 5.6% 07/01/18.   Insomnia 09/05/2011   Migraine    worst during menopause   Orthostatic hypotension 11/20/2021   Overweight (BMI 25.0-29.9) 09/05/2011   Seasonal allergic rhinitis    Subclinical hypothyroidism    Vertigo, benign positional 09/08/2011    Past Surgical History:  Procedure Laterality Date   CARDIOVASCULAR STRESS TEST  02/15/2015   Exercise myocard perfusion testing: Normal EF, normal wall motion, normal perfusion.   Poor exercise capacity (4 min).   COLONOSCOPY  07/2006; 08/25/17   2007: pseudomembranous colitis, o/w normal. (Dr. Ardis Hughs).  08/2017: adenomatous polyp--recall 3-5 yrs   COSMETIC SURGERY     DEXA  06/19/09; 01/19/2019   Normal 2010 and 2020.   POSTERIOR REPAIR  2012   TONSILLECTOMY  1982   TRANSTHORACIC ECHOCARDIOGRAM  01/06/2022   NORMAL   Transvaginal ultrasound  10/2016   Normal (done for LLQ pain by Dr. Benay Pike).   TUBAL LIGATION  1983   vaginal hernia repair  05/2011   rectocele repair    Prior to Admission medications   Medication Sig Start Date End Date Taking? Authorizing Provider  aspirin 81 MG tablet Take 81 mg by mouth daily.   Yes [provider]  fludrocortisone (FLORINEF) 0.1 MG tablet Take 1 tablet (100 mcg total) by mouth daily. 10/21/22  Yes Skeet Latch, MD  METRONIDAZOLE, TOPICAL, 0.75 % LOTN Apply 1 Application topically daily. Patient taking differently: Apply 1 Application topically as needed. 07/08/22  Yes Lavonna Monarch, MD  midodrine (PROAMATINE) 5 MG tablet TAKE 1 TABLET (5 MG TOTAL) BY MOUTH 3 (THREE) TIMES DAILY WITH MEALS. 11/17/22  Yes McGowen, Adrian Blackwater, MD  pantoprazole (PROTONIX) 40 MG tablet Take 1 tablet (40 mg total) by mouth daily as needed. 09/26/21  Yes McGowen, Adrian Blackwater, MD  sertraline (ZOLOFT) 100 MG tablet 2 tabs po qhs 02/10/22  Yes McGowen, Adrian Blackwater, MD  simvastatin (ZOCOR) 40  MG tablet TAKE 1 TABLET BY MOUTH DAILY AT 6 PM. 08/22/22  Yes McGowen, Adrian Blackwater, MD  ALPRAZolam Duanne Moron) 0.25 MG tablet Take 1 tablet (0.25 mg total) by mouth 2 (two) times daily as needed. 08/22/22   McGowen, Adrian Blackwater, MD  fluticasone (FLONASE) 50 MCG/ACT nasal spray Place 2 sprays into both nostrils daily. 08/22/22   McGowen, Adrian Blackwater, MD  loratadine (CLARITIN) 10 MG tablet Take 10 mg by mouth daily as needed. Spring and fall    [provider]  Melatonin 5 MG CAPS Take 5 mg by mouth as needed.    [provider]  meloxicam (MOBIC) 7.5 MG tablet  TAKE 1 TO 2 TABLETS EVERY DAY AS NEEDED FOR MUSCULOSKELETAL PAIN 12/03/22   McGowen, Adrian Blackwater, MD  valACYclovir (VALTREX) 1000 MG tablet Take 1 tablet (1,000 mg total) by mouth daily. 07/08/22   Lavonna Monarch, MD    Current Outpatient Medications  Medication Sig Dispense Refill   aspirin 81 MG tablet Take 81 mg by mouth daily.     fludrocortisone (FLORINEF) 0.1 MG tablet Take 1 tablet (100 mcg total) by mouth daily. 30 tablet 2   METRONIDAZOLE, TOPICAL, 0.75 % LOTN Apply 1 Application topically daily. (Patient taking differently: Apply 1 Application topically as needed.) 59 mL 0   midodrine (PROAMATINE) 5 MG tablet TAKE 1 TABLET (5 MG TOTAL) BY MOUTH 3 (THREE) TIMES DAILY WITH MEALS. 270 tablet 3   pantoprazole (PROTONIX) 40 MG tablet Take 1 tablet (40 mg total) by mouth daily as needed. 90 tablet 3   sertraline (ZOLOFT) 100 MG tablet 2 tabs po qhs 180 tablet 3   simvastatin (ZOCOR) 40 MG tablet TAKE 1 TABLET BY MOUTH DAILY AT 6 PM. 90 tablet 3   ALPRAZolam (XANAX) 0.25 MG tablet Take 1 tablet (0.25 mg total) by mouth 2 (two) times daily as needed. 90 tablet 1   fluticasone (FLONASE) 50 MCG/ACT nasal spray Place 2 sprays into both nostrils daily. 16 g 6   loratadine (CLARITIN) 10 MG tablet Take 10 mg by mouth daily as needed. Spring and fall     Melatonin 5 MG CAPS Take 5 mg by mouth as needed.     meloxicam (MOBIC) 7.5 MG tablet TAKE 1 TO 2 TABLETS EVERY DAY AS NEEDED FOR MUSCULOSKELETAL PAIN 60 tablet 2   valACYclovir (VALTREX) 1000 MG tablet Take 1 tablet (1,000 mg total) by mouth daily. 10 tablet 1   Current Facility-Administered Medications  Medication Dose Route Frequency Provider Last Rate Last Admin   0.9 %  sodium chloride infusion  500 mL Intravenous Once Brandis Wixted, Carlota Raspberry, MD        Allergies as of 01/02/2023   (No Known Allergies)    Family History  Problem Relation Age of Onset   Diabetes Mother 30       type 2   Atrial fibrillation Mother    Aneurysm Father         aortic   Cancer Father        prostate and lung/ smoker   Pernicious anemia Father    Hyperlipidemia Sister    Hypertension Sister    Diabetes Sister        type 2   Other Sister        degenerative disc disease   Thyroid disease Sister    Cancer Sister        breast  cancer s/p dbl mastectomy doing well   Hypertension Sister    Hyperlipidemia  Sister    Diabetes Sister        type 2   Breast cancer Sister 20   Hyperlipidemia Brother    Allergies Brother    Breast cancer Maternal Grandmother 49   Heart attack Maternal Grandfather    Heart disease Paternal Grandmother        CHF   Pernicious anemia Paternal Grandmother    Kidney disease Paternal Grandfather    Stroke Paternal Grandfather    Diabetes Daughter        type 1   Allergies Daughter    Colon cancer Neg Hx    Esophageal cancer Neg Hx    Stomach cancer Neg Hx    Rectal cancer Neg Hx     Social History   Socioeconomic History   Marital status: Married    Spouse name: Not on file   Number of children: Not on file   Years of education: Not on file   Highest education level: Master's degree (e.g., MA, MS, MEng, MEd, MSW, MBA)  Occupational History   Not on file  Tobacco Use   Smoking status: Never   Smokeless tobacco: Never   Tobacco comments:    smoked for about 6 months  Vaping Use   Vaping Use: Never used  Substance and Sexual Activity   Alcohol use: Yes    Comment: moderate use   Drug use: No   Sexual activity: Yes    Birth control/protection: Post-menopausal  Other Topics Concern   Not on file  Social History Narrative   Married.   Nonsmoker.   Social Determinants of Health   Financial Resource Strain: Low Risk  (04/23/2022)   Overall Financial Resource Strain (CARDIA)    Difficulty of Paying Living Expenses: Not hard at all  Food Insecurity: No Food Insecurity (08/22/2022)   Hunger Vital Sign    Worried About Running Out of Food in the Last Year: Never true    Ran Out of Food in the Last  Year: Never true  Transportation Needs: No Transportation Needs (08/22/2022)   PRAPARE - Hydrologist (Medical): No    Lack of Transportation (Non-Medical): No  Physical Activity: Insufficiently Active (08/22/2022)   Exercise Vital Sign    Days of Exercise per Week: 3 days    Minutes of Exercise per Session: 30 min  Stress: Stress Concern Present (08/22/2022)   [place]    Feeling of Stress : To some extent  Social Connections: Moderately Integrated (08/22/2022)   Social Connection and Isolation Panel [NHANES]    Frequency of Communication with Friends and Family: More than three times a week    Frequency of Social Gatherings with Friends and Family: Once a week    Attends Religious Services: More than 4 times per year    Active Member of [employer] or Organizations: No    Attends Archivist Meetings: Never    Marital Status: Married  Human resources officer Violence: Not At Risk (04/23/2022)   Humiliation, Afraid, Rape, and Kick questionnaire    Fear of Current or Ex-Partner: No    Emotionally Abused: No    Physically Abused: No    Sexually Abused: No    Review of Systems: All other review of systems negative except as mentioned in the HPI.  Physical Exam: Vital signs BP (!) 141/70   Pulse 78   Temp (!) 97.3 F (36.3 C)   Resp 16   Ht  $'5\' 3"'c$  (1.6 m)   Wt 151 lb (68.5 kg)   SpO2 95%   BMI 26.75 kg/m   General:   Alert,  Well-developed, pleasant and cooperative in NAD Lungs:  Clear throughout to auscultation.   Heart:  Regular rate and rhythm Abdomen:  Soft, nontender and nondistended.   Neuro/Psych:  Alert and cooperative. Normal mood and affect. A and O x 3  Jolly Mango, MD [facility]

## 2023-01-02 NOTE — Patient Instructions (Signed)
Await pathology results.  Handouts on polyps, heartburn/hiatal hernia/ and GERD provided.  YOU HAD AN ENDOSCOPIC PROCEDURE TODAY AT [facility]:   Refer to the procedure report that was given to you for any specific questions about what was found during the examination.  If the procedure report does not answer your questions, please call your gastroenterologist to clarify.  If you requested that your care partner not be given the details of your procedure findings, then the procedure report has been included in a sealed envelope for you to review at your convenience later.  YOU SHOULD EXPECT: Some feelings of bloating in the abdomen. Passage of more gas than usual.  Walking can help get rid of the air that was put into your GI tract during the procedure and reduce the bloating. If you had a lower endoscopy (such as a colonoscopy or flexible sigmoidoscopy) you may notice spotting of blood in your stool or on the toilet paper. If you underwent a bowel prep for your procedure, you may not have a normal bowel movement for a few days.  Please Note:  You might notice some irritation and congestion in your nose or some drainage.  This is from the oxygen used during your procedure.  There is no need for concern and it should clear up in a day or so.  SYMPTOMS TO REPORT IMMEDIATELY:  Following lower endoscopy (colonoscopy or flexible sigmoidoscopy):  Excessive amounts of blood in the stool  Significant tenderness or worsening of abdominal pains  Swelling of the abdomen that is new, acute  Fever of 100F or higher  Following upper endoscopy (EGD)  Vomiting of blood or coffee ground material  New chest pain or pain under the shoulder blades  Painful or persistently difficult swallowing  New shortness of breath  Fever of 100F or higher  Black, tarry-looking stools  For urgent or emergent issues, a gastroenterologist can be reached at any hour by calling [phone]. Do not use  MyChart messaging for urgent concerns.    DIET:  We do recommend a small meal at first, but then you may proceed to your regular diet.  Drink plenty of fluids but you should avoid alcoholic beverages for 24 hours.  ACTIVITY:  You should plan to take it easy for the rest of today and you should NOT DRIVE or use heavy machinery until tomorrow (because of the sedation medicines used during the test).    FOLLOW UP: Our staff will call the number listed on your records the next business day following your procedure.  We will call around 7:15- 8:00 am to check on you and address any questions or concerns that you may have regarding the information given to you following your procedure. If we do not reach you, we will leave a message.     If any biopsies were taken you will be contacted by phone or by letter within the next 1-3 weeks.  Please call us at [phone] if you have not heard about the biopsies in 3 weeks.    SIGNATURES/CONFIDENTIALITY: You and/or your care partner have signed paperwork which will be entered into your electronic medical record.  These signatures attest to the fact that that the information above on your After Visit Summary has been reviewed and is understood.  Full responsibility of the confidentiality of this discharge information lies with you and/or your care-partner.

## 2023-01-02 NOTE — Op Note (Signed)
[place] Patient Name: Barbara Munoz Procedure Date: 01/02/2023 10:29 AM MRN: [id number] Endoscopist: Remo Lipps P. Havery Moros , MD, [id number] Age: 72 Referring MD:  Date of Birth: August 05, 1951 Gender: Female Account #: [id number] Procedure:                Colonoscopy Indications:              High risk colon cancer surveillance: Personal                            history of colonic polyps, also prior history of                            iron deficiency thought to be due to blood donation                            - recent CBC and iron studies normal Medicines:                Monitored Anesthesia Care Procedure:                Pre-Anesthesia Assessment:                           - Prior to the procedure, a History and Physical                            was performed, and patient medications and                            allergies were reviewed. The patient's tolerance of                            previous anesthesia was also reviewed. The risks                            and benefits of the procedure and the sedation                            options and risks were discussed with the patient.                            All questions were answered, and informed consent                            was obtained. Prior Anticoagulants: The patient has                            taken no anticoagulant or antiplatelet agents. ASA                            Grade Assessment: II - A patient with mild systemic                            disease. After reviewing the risks and benefits,  the patient was deemed in satisfactory condition to                            undergo the procedure.                           After obtaining informed consent, the colonoscope                            was passed under direct vision. Throughout the                            procedure, the patient's blood pressure, pulse, and                            oxygen saturations  were monitored continuously. The                            PCF-HQ190L Colonoscope was introduced through the                            anus and advanced to the the cecum, identified by                            appendiceal orifice and ileocecal valve. The                            colonoscopy was performed without difficulty. The                            patient tolerated the procedure well. The quality                            of the bowel preparation was good. The ileocecal                            valve, appendiceal orifice, and rectum were                            photographed. Scope In: 10:55:50 AM Scope Out: 11:18:16 AM Scope Withdrawal Time: 0 hours 17 minutes 27 seconds  Total Procedure Duration: 0 hours 22 minutes 26 seconds  Findings:                 The perianal and digital rectal examinations were                            normal.                           A 4 mm polyp was found in the cecum. The polyp was                            sessile. The polyp was removed with a cold snare.  Resection and retrieval were complete.                           A single medium-sized angiodysplastic lesion was                            found in the cecum.                           A diminutive polyp was found in the ascending                            colon. The polyp was sessile. The polyp was removed                            with a cold snare. Resection and retrieval were                            complete.                           Two flat and sessile polyps were found in the                            transverse colon. The polyps were 2 to 5 mm in                            size. These polyps were removed with a cold snare.                            Resection and retrieval were complete.                           A 3 mm polyp was found in the rectum. The polyp was                            sessile. The polyp was removed with a cold snare.                             Resection and retrieval were complete.                           A few small-mouthed diverticula were found in the                            sigmoid colon.                           Internal hemorrhoids were found during                            retroflexion. The hemorrhoids were moderate. There  was an ulceration along one hemorrhoid I suspect                            from prolapse change - some slightly nodular mucosa                            noted near this, I suspect inflammatory. Small                            peripheral biopsy was taken with a cold forceps for                            histology to rule out adenomatous change.                           The exam was otherwise without abnormality. Complications:            No immediate complications. Estimated blood loss:                            Minimal. Estimated Blood Loss:     Estimated blood loss was minimal. Impression:               - One 4 mm polyp in the cecum, removed with a cold                            snare. Resected and retrieved.                           - A single colonic angiodysplastic lesion.                           - One diminutive polyp in the ascending colon,                            removed with a cold snare. Resected and retrieved.                           - Two 2 to 5 mm polyps in the transverse colon,                            removed with a cold snare. Resected and retrieved.                           - One 3 mm polyp in the rectum, removed with a cold                            snare. Resected and retrieved.                           - Diverticulosis in the sigmoid colon.                           - Internal hemorrhoids with likely prolapse /  inflammatory change. Biopsied.                           - The examination was otherwise normal. Recommendation:           - Patient has a contact number available for                             emergencies. The signs and symptoms of potential                            delayed complications were discussed with the                            patient. Return to normal activities tomorrow.                            Written discharge instructions were provided to the                            patient.                           - Resume previous diet.                           - Continue present medications.                           - Await pathology results. Remo Lipps P. Daeton Kluth, MD 01/02/2023 11:30:50 AM This report has been signed electronically.

## 2023-01-02 NOTE — Progress Notes (Signed)
Report given to PACU, vss 

## 2023-01-05 ENCOUNTER — Telehealth: Payer: Self-pay

## 2023-01-05 NOTE — Telephone Encounter (Signed)
  Follow up Call-     01/02/2023    9:54 AM  Call back number  Post procedure Call Back phone  # [phone]  Permission to leave phone message Yes     Patient questions:  Do you have a fever, pain , or abdominal swelling? No. Pain Score  0 *  Have you tolerated food without any problems? Yes.    Have you been able to return to your normal activities? Yes.    Do you have any questions about your discharge instructions: Diet   No. Medications  No. Follow up visit  No.  Do you have questions or concerns about your Care? No.  Actions: * If pain score is 4 or above: No action needed, pain <4.

## 2023-01-06 ENCOUNTER — Encounter: Payer: Self-pay | Admitting: Family Medicine

## 2023-01-15 ENCOUNTER — Ambulatory Visit (HOSPITAL_BASED_OUTPATIENT_CLINIC_OR_DEPARTMENT_OTHER)
Admission: RE | Admit: 2023-01-15 | Discharge: 2023-01-15 | Disposition: A | Discharge status: Home / Self Care | Payer: Medicare PPO | Source: Ambulatory Visit | Attending: Cardiovascular Disease | Admitting: Cardiovascular Disease

## 2023-01-15 DIAGNOSIS — E7800 Pure hypercholesterolemia, unspecified: Secondary | ICD-10-CM | POA: Insufficient documentation

## 2023-01-24 ENCOUNTER — Other Ambulatory Visit (HOSPITAL_BASED_OUTPATIENT_CLINIC_OR_DEPARTMENT_OTHER): Payer: Self-pay | Admitting: Cardiovascular Disease

## 2023-02-01 ENCOUNTER — Other Ambulatory Visit: Payer: Self-pay | Admitting: Family Medicine

## 2023-02-25 NOTE — Patient Instructions (Signed)

## 2023-02-26 ENCOUNTER — Ambulatory Visit (INDEPENDENT_AMBULATORY_CARE_PROVIDER_SITE_OTHER): Payer: Medicare PPO | Admitting: Family Medicine

## 2023-02-26 ENCOUNTER — Encounter: Payer: Self-pay | Admitting: Family Medicine

## 2023-02-26 VITALS — BP 115/71 | HR 68 | Temp 98.1°F | Ht 64.0 in | Wt 155.2 lb

## 2023-02-26 DIAGNOSIS — E538 Deficiency of other specified B group vitamins: Secondary | ICD-10-CM | POA: Diagnosis not present

## 2023-02-26 DIAGNOSIS — Z79899 Other long term (current) drug therapy: Secondary | ICD-10-CM | POA: Diagnosis not present

## 2023-02-26 DIAGNOSIS — E7800 Pure hypercholesterolemia, unspecified: Secondary | ICD-10-CM | POA: Diagnosis not present

## 2023-02-26 DIAGNOSIS — F411 Generalized anxiety disorder: Secondary | ICD-10-CM

## 2023-02-26 DIAGNOSIS — I950 Idiopathic hypotension: Secondary | ICD-10-CM | POA: Diagnosis not present

## 2023-02-26 DIAGNOSIS — M8580 Other specified disorders of bone density and structure, unspecified site: Secondary | ICD-10-CM | POA: Diagnosis not present

## 2023-02-26 DIAGNOSIS — F339 Major depressive disorder, recurrent, unspecified: Secondary | ICD-10-CM | POA: Diagnosis not present

## 2023-02-26 DIAGNOSIS — E348 Other specified endocrine disorders: Secondary | ICD-10-CM | POA: Diagnosis not present

## 2023-02-26 DIAGNOSIS — Z0000 Encounter for general adult medical examination without abnormal findings: Secondary | ICD-10-CM | POA: Diagnosis not present

## 2023-02-26 LAB — COMPREHENSIVE METABOLIC PANEL
ALT: 28 U/L (ref 0–35)
AST: 28 U/L (ref 0–37)
Albumin: 4 g/dL (ref 3.5–5.2)
Alkaline Phosphatase: 54 U/L (ref 39–117)
BUN: 15 mg/dL (ref 6–23)
CO2: 29 mEq/L (ref 19–32)
Calcium: 9.4 mg/dL (ref 8.4–10.5)
Chloride: 106 mEq/L (ref 96–112)
Creatinine, Ser: 0.67 mg/dL (ref 0.40–1.20)
GFR: 88.03 mL/min (ref >60.00)
Glucose, Bld: 94 mg/dL (ref 70–99)
Potassium: 3.8 mEq/L (ref 3.5–5.1)
Sodium: 143 mEq/L (ref 135–145)
Total Bilirubin: 0.5 mg/dL (ref 0.2–1.2)
Total Protein: 6.6 g/dL (ref 6.0–8.3)

## 2023-02-26 LAB — CBC
HCT: 40.7 % (ref 36.0–46.0)
Hemoglobin: 13.6 g/dL (ref 12.0–15.0)
MCHC: 33.4 g/dL (ref 30.0–36.0)
MCV: 93.2 fl (ref 78.0–100.0)
Platelets: 174 10*3/uL (ref 150.0–400.0)
RBC: 4.37 Mil/uL (ref 3.87–5.11)
RDW: 12.9 % (ref 11.5–15.5)
WBC: 6.2 10*3/uL (ref 4.0–10.5)

## 2023-02-26 LAB — VITAMIN B12: Vitamin B-12: 455 pg/mL (ref 211–911)

## 2023-02-26 LAB — LIPID PANEL
Cholesterol: 192 mg/dL (ref 0–200)
HDL: 48.5 mg/dL (ref >39.00)
LDL Cholesterol: 119 mg/dL — ABNORMAL HIGH (ref 0–99)
NonHDL: 143.19
Total CHOL/HDL Ratio: 4
Triglycerides: 123 mg/dL (ref 0.0–149.0)
VLDL: 24.6 mg/dL (ref 0.0–40.0)

## 2023-02-26 LAB — TSH: TSH: 2.96 u[IU]/mL (ref 0.35–5.50)

## 2023-02-26 NOTE — Progress Notes (Signed)
Office Note 02/26/2023  CC:  Chief Complaint  Patient presents with   Annual Exam    Pt is fasting    HPI:  Patient is a 72 y.o. female who is here accompanied by her husband and he for annual health maintenance exam and follow-up hypotension and anxiety/depression.   Anxiety/mood varies depending on how husband Jonni Sanger is doing---fine last couple days. Last couple of weeks have been significantly hard for her but things have improved the last couple of days.  No problems with dizziness or low blood pressure lately.  She does endorse chronic fatigue, fluctuates according to the weather.  She does better when she is outside regularly.  PMP AWARE reviewed today: most recent rx for alprazolam 0.25 mg was filled 08/22/2022, # 30, rx by me. No red flags.  Past Medical History:  Diagnosis Date   Anxiety and depression 09/08/2011   Diverticulosis    12/2022 colonoscopy   Female bladder prolapse    GERD (gastroesophageal reflux disease) 09/05/2011   Improved signif with PPI   History of adenomatous polyp of colon 08/2017   History of Clostridium difficile infection 2007   colonic changes c/w with this dx seen on colonoscopy 2007---sx's responded to flagyl.   History of cyst of breast    multiple drained during menopause   Hyperlipidemia    Hypotension 2022   ?idiopathic   IFG (impaired fasting glucose) 06/2018   Fastings 105-115 range when pt checked with husband's glucometer.  Fasting gluc here was 98 and A1c 5.6% 07/01/18.   Insomnia 09/05/2011   Migraine    worst during menopause   Orthostatic hypotension 11/20/2021   Overweight (BMI 25.0-29.9) 09/05/2011   Seasonal allergic rhinitis    Subclinical hypothyroidism    Vertigo, benign positional 09/08/2011    Past Surgical History:  Procedure Laterality Date   CARDIOVASCULAR STRESS TEST  02/15/2015   Exercise myocard perfusion testing: Normal EF, normal wall motion, normal perfusion.  Poor exercise capacity (4 min).    COLONOSCOPY  07/2006; 08/25/17   2007: pseudomembranous colitis, o/w normal. (Dr. Ardis Hughs).  08/2017: adenomas.  12/2022 adenomas.   COSMETIC SURGERY     DEXA  06/19/09; 01/19/2019   Normal 2010 and 2020.   ESOPHAGOGASTRODUODENOSCOPY     01/02/23, normal   POSTERIOR REPAIR  2012   REFRACTIVE SURGERY  05/2022   TONSILLECTOMY  1982   TRANSTHORACIC ECHOCARDIOGRAM  01/06/2022   NORMAL   Transvaginal ultrasound  10/2016   Normal (done for LLQ pain by Dr. Benay Pike).   TUBAL LIGATION  1983   vaginal hernia repair  05/2011   rectocele repair    Family History  Problem Relation Age of Onset   Diabetes Mother 27       type 2   Atrial fibrillation Mother    Aneurysm Father        aortic   Cancer Father        prostate and lung/ smoker   Pernicious anemia Father    Hyperlipidemia Sister    Hypertension Sister    Diabetes Sister        type 2   Other Sister        degenerative disc disease   Thyroid disease Sister    Cancer Sister        breast  cancer s/p dbl mastectomy doing well   Hypertension Sister    Hyperlipidemia Sister    Diabetes Sister        type 2   Breast cancer  Sister 22   Hyperlipidemia Brother    Allergies Brother    Breast cancer Maternal Grandmother 102   Heart attack Maternal Grandfather    Heart disease Paternal Grandmother        CHF   Pernicious anemia Paternal Grandmother    Kidney disease Paternal Grandfather    Stroke Paternal Grandfather    Diabetes Daughter        type 1   Allergies Daughter    Colon cancer Neg Hx    Esophageal cancer Neg Hx    Stomach cancer Neg Hx    Rectal cancer Neg Hx     Social History   Socioeconomic History   Marital status: Married    Spouse name: Not on file   Number of children: Not on file   Years of education: Not on file   Highest education level: Master's degree (e.g., MA, MS, MEng, MEd, MSW, MBA)  Occupational History   Not on file  Tobacco Use   Smoking status: Never   Smokeless tobacco: Never    Tobacco comments:    smoked for about 6 months  Vaping Use   Vaping Use: Never used  Substance and Sexual Activity   Alcohol use: Yes    Comment: moderate use   Drug use: No   Sexual activity: Yes    Birth control/protection: Post-menopausal  Other Topics Concern   Not on file  Social History Narrative   Married.   Nonsmoker.   Social Determinants of Health   Financial Resource Strain: Low Risk  (04/23/2022)   Overall Financial Resource Strain (CARDIA)    Difficulty of Paying Living Expenses: Not hard at all  Food Insecurity: No Food Insecurity (08/22/2022)   Hunger Vital Sign    Worried About Running Out of Food in the Last Year: Never true    Ran Out of Food in the Last Year: Never true  Transportation Needs: No Transportation Needs (08/22/2022)   PRAPARE - Hydrologist (Medical): No    Lack of Transportation (Non-Medical): No  Physical Activity: Insufficiently Active (08/22/2022)   Exercise Vital Sign    Days of Exercise per Week: 3 days    Minutes of Exercise per Session: 30 min  Stress: Stress Concern Present (08/22/2022)   [place]    Feeling of Stress : To some extent  Social Connections: Moderately Integrated (08/22/2022)   Social Connection and Isolation Panel [NHANES]    Frequency of Communication with Friends and Family: More than three times a week    Frequency of Social Gatherings with Friends and Family: Once a week    Attends Religious Services: More than 4 times per year    Active Member of [employer] or Organizations: No    Attends Archivist Meetings: Never    Marital Status: Married  Human resources officer Violence: Not At Risk (04/23/2022)   Humiliation, Afraid, Rape, and Kick questionnaire    Fear of Current or Ex-Partner: No    Emotionally Abused: No    Physically Abused: No    Sexually Abused: No    Outpatient Medications Prior to Visit  Medication Sig Dispense  Refill   ALPRAZolam (XANAX) 0.25 MG tablet Take 1 tablet (0.25 mg total) by mouth 2 (two) times daily as needed. 90 tablet 1   aspirin 81 MG tablet Take 81 mg by mouth daily.     fludrocortisone (FLORINEF) 0.1 MG tablet Take 1 tablet (100 mcg  total) by mouth daily. 90 tablet 2   fluticasone (FLONASE) 50 MCG/ACT nasal spray Place 2 sprays into both nostrils daily. 16 g 6   loratadine (CLARITIN) 10 MG tablet Take 10 mg by mouth daily as needed. Spring and fall     Melatonin 5 MG CAPS Take 5 mg by mouth as needed.     meloxicam (MOBIC) 7.5 MG tablet TAKE 1 TO 2 TABLETS EVERY DAY AS NEEDED FOR MUSCULOSKELETAL PAIN 60 tablet 2   METRONIDAZOLE, TOPICAL, 0.75 % LOTN Apply 1 Application topically daily. (Patient taking differently: Apply 1 Application topically as needed.) 59 mL 0   midodrine (PROAMATINE) 5 MG tablet TAKE 1 TABLET (5 MG TOTAL) BY MOUTH 3 (THREE) TIMES DAILY WITH MEALS. 270 tablet 3   pantoprazole (PROTONIX) 40 MG tablet Take 1 tablet (40 mg total) by mouth daily as needed. 90 tablet 3   sertraline (ZOLOFT) 100 MG tablet TAKE 2 TABLETS BY MOUTH EVERY DAY AT BEDTIME 60 tablet 0   simvastatin (ZOCOR) 40 MG tablet TAKE 1 TABLET BY MOUTH DAILY AT 6 PM. 90 tablet 3   valACYclovir (VALTREX) 1000 MG tablet Take 1 tablet (1,000 mg total) by mouth daily. 10 tablet 1   No facility-administered medications prior to visit.    No Known Allergies  Review of Systems  Constitutional:  Positive for fatigue. Negative for appetite change, chills and fever.  HENT:  Negative for congestion, dental problem, ear pain and sore throat.   Eyes:  Negative for discharge, redness and visual disturbance.  Respiratory:  Negative for cough, chest tightness, shortness of breath and wheezing.   Cardiovascular:  Negative for chest pain, palpitations and leg swelling.  Gastrointestinal:  Negative for abdominal pain, blood in stool, diarrhea, nausea and vomiting.  Genitourinary:  Negative for difficulty urinating,  dysuria, flank pain, frequency, hematuria and urgency.  Musculoskeletal:  Negative for arthralgias, back pain, joint swelling, myalgias and neck stiffness.  Skin:  Negative for pallor and rash.  Neurological:  Negative for dizziness, speech difficulty, weakness and headaches.  Hematological:  Negative for adenopathy. Does not bruise/bleed easily.  Psychiatric/Behavioral:  Negative for confusion and sleep disturbance. The patient is not nervous/anxious.     PE;    02/26/2023    9:55 AM 01/02/2023   11:42 AM 01/02/2023   11:32 AM  Vitals with BMI  Height '5\' 4"'$     Weight 155 lbs 3 oz    BMI [id number]    Systolic [id number]  Diastolic 71 48 41  Pulse 68 76 77     Gen: Alert, well appearing.  Patient is oriented to person, place, time, and situation. AFFECT: pleasant, lucid thought and speech. ENT: Ears: EACs clear, normal epithelium.  TMs with good light reflex and landmarks bilaterally.  Eyes: no injection, icteris, swelling, or exudate.  EOMI, PERRLA. Nose: no drainage or turbinate edema/swelling.  No injection or focal lesion.  Mouth: lips without lesion/swelling.  Oral mucosa pink and moist.  Dentition intact and without obvious caries or gingival swelling.  Oropharynx without erythema, exudate, or swelling.  Neck: supple/nontender.  No LAD, mass, or TM.  Carotid pulses 2+ bilaterally, without bruits. CV: RRR, no m/r/g.   LUNGS: CTA bilat, nonlabored resps, good aeration in all lung fields. ABD: soft, NT, ND, BS normal.  No hepatospenomegaly or mass.  No bruits. EXT: no clubbing, cyanosis, or edema.  Musculoskeletal: no joint swelling, erythema, warmth, or tenderness.  ROM of all joints intact. Skin - no sores or suspicious  lesions or rashes or color changes  Pertinent labs:  Lab Results  Component Value Date   TSH 2.37 02/10/2022   Lab Results  Component Value Date   WBC 7.0 11/06/2022   HGB 14.7 11/06/2022   HCT 43.2 11/06/2022   MCV 93.2 11/06/2022   PLT 175.0 11/06/2022    Lab Results  Component Value Date   IRON 90 11/06/2022   TIBC 326.2 11/06/2022   FERRITIN 236.0 11/06/2022   Lab Results  Component Value Date   CREATININE 0.72 11/06/2022   BUN 13 11/06/2022   NA 137 11/06/2022   K 4.7 11/06/2022   CL 101 11/06/2022   CO2 32 11/06/2022   Lab Results  Component Value Date   ALT 38 (H) 11/06/2022   AST 34 11/06/2022   ALKPHOS 57 11/06/2022   BILITOT 0.4 11/06/2022   Lab Results  Component Value Date   CHOL 188 02/10/2022   Lab Results  Component Value Date   HDL 48.90 02/10/2022   Lab Results  Component Value Date   LDLCALC 105 (H) 02/10/2022   Lab Results  Component Value Date   TRIG 170.0 (H) 02/10/2022   Lab Results  Component Value Date   CHOLHDL 4 02/10/2022   Lab Results  Component Value Date   HGBA1C 5.4 11/08/2019   Lab Results  Component Value Date   VITAMINB12 1,077 (H) 02/10/2022   ASSESSMENT AND PLAN:   #1 health maintenance exam: Reviewed age and gender appropriate health maintenance issues (prudent diet, regular exercise, health risks of tobacco and excessive alcohol, use of seatbelts, fire alarms in home, use of sunscreen).  Also reviewed age and gender appropriate health screening as well as vaccine recommendations. Vaccines: ALL UTD. Labs: fasting HP Cervical ca screening: per GYN MD. Breast ca screening: rpt due around September this year via Dr. Cletis Media, [place]. Colon ca screening: 12/2022 adenomas-->recall 5 yrs. Osteoporosis screening: has been done in the past by her GYN provider, most recent 2020--> ordered repeat today.  Continue vitamin D and calcium supplement.  #2 idiopathic hypotension. Doing well on Florinef 0.1 mg daily and midodrine 5 mg 3 times a day.  Liberal salt intake.  Compression stockings as needed.  #3 hyperlipidemia, doing well on simvastatin 40 mg a day. Lipid panel and hepatic panel today.  #4 GAD with recurrent major depressive disorder. Stable on sertraline 200 mg a day.   She uses alprazolam 0.25 mg on only an occasional basis.  #5 vitamin B12 deficiency. She has not taken her B12 supplement in the last 3 months or so. Check B12 level today.  An After Visit Summary was printed and given to the patient.  FOLLOW UP:  Return in about 6 months (around 08/29/2023) for routine chronic illness f/u.  Signed:  Crissie Sickles, MD           02/26/2023

## 2023-02-27 ENCOUNTER — Other Ambulatory Visit: Payer: Self-pay | Admitting: Family Medicine

## 2023-02-27 ENCOUNTER — Encounter: Payer: Self-pay | Admitting: Family Medicine

## 2023-03-03 ENCOUNTER — Encounter: Payer: Self-pay | Admitting: Family Medicine

## 2023-03-05 ENCOUNTER — Encounter: Payer: Self-pay | Admitting: Family Medicine

## 2023-03-05 ENCOUNTER — Ambulatory Visit (HOSPITAL_BASED_OUTPATIENT_CLINIC_OR_DEPARTMENT_OTHER)
Admission: RE | Admit: 2023-03-05 | Discharge: 2023-03-05 | Disposition: A | Discharge status: Home / Self Care | Payer: Medicare PPO | Source: Ambulatory Visit | Attending: Family Medicine | Admitting: Family Medicine

## 2023-03-05 DIAGNOSIS — E348 Other specified endocrine disorders: Secondary | ICD-10-CM | POA: Diagnosis not present

## 2023-03-05 DIAGNOSIS — Z13820 Encounter for screening for osteoporosis: Secondary | ICD-10-CM | POA: Diagnosis not present

## 2023-03-05 DIAGNOSIS — Z780 Asymptomatic menopausal state: Secondary | ICD-10-CM | POA: Diagnosis not present

## 2023-03-05 DIAGNOSIS — M8580 Other specified disorders of bone density and structure, unspecified site: Secondary | ICD-10-CM | POA: Diagnosis not present

## 2023-03-21 ENCOUNTER — Other Ambulatory Visit: Payer: Self-pay | Admitting: Family Medicine

## 2023-04-13 ENCOUNTER — Other Ambulatory Visit: Payer: Self-pay | Admitting: Family Medicine

## 2023-04-14 ENCOUNTER — Other Ambulatory Visit: Payer: Self-pay

## 2023-04-14 NOTE — Telephone Encounter (Signed)
Refills requested for Pantoprazole and Alpazolam sent to [facility] in [place].

## 2023-04-29 ENCOUNTER — Ambulatory Visit: Payer: Medicare PPO

## 2023-06-24 ENCOUNTER — Ambulatory Visit (INDEPENDENT_AMBULATORY_CARE_PROVIDER_SITE_OTHER): Payer: Medicare PPO

## 2023-06-24 VITALS — Ht 63.0 in | Wt 153.0 lb

## 2023-06-24 DIAGNOSIS — Z0000 Encounter for general adult medical examination without abnormal findings: Secondary | ICD-10-CM

## 2023-06-24 NOTE — Patient Instructions (Signed)
[name] , Thank you for taking time to come for your Medicare Wellness Visit. I appreciate your ongoing commitment to your health goals. Please review the following plan we discussed and let me know if I can assist you in the future.   These are the goals we discussed:  Goals      Patient Stated     Stronger and work on balance  Learn how to relax and enjoy life        This is a list of the screening recommended for you and due dates:  Health Maintenance  Topic Date Due   COVID-19 Vaccine (5 - 2023-24 season) 08/22/2022   Flu Shot  07/23/2023   Mammogram  08/29/2023   Medicare Annual Wellness Visit  06/23/2024   DEXA scan (bone density measurement)  03/04/2025   Colon Cancer Screening  01/02/2026   DTaP/Tdap/Td vaccine (4 - Td or Tdap) 06/26/2026   Pneumonia Vaccine  Completed   Hepatitis C Screening  Completed   Zoster (Shingles) Vaccine  Completed   HPV Vaccine  Aged Out    Advanced directives: Please bring a copy of your health care power of attorney and living will to the office to be added to your chart at your convenience.   Conditions/risks identified: Aim for 30 minutes of exercise or brisk walking, 6-8 glasses of water, and 5 servings of fruits and vegetables each day.   Next appointment: Follow up in one year for your annual wellness visit    Preventive Care 65 Years and Older, Female Preventive care refers to lifestyle choices and visits with your health care provider that can promote health and wellness. What does preventive care include? A yearly physical exam. This is also called an annual well check. Dental exams once or twice a year. Routine eye exams. Ask your health care provider how often you should have your eyes checked. Personal lifestyle choices, including: Daily care of your teeth and gums. Regular physical activity. Eating a healthy diet. Avoiding tobacco and drug use. Limiting alcohol use. Practicing safe sex. Taking low-dose aspirin  every day. Taking vitamin and mineral supplements as recommended by your health care provider. What happens during an annual well check? The services and screenings done by your health care provider during your annual well check will depend on your age, overall health, lifestyle risk factors, and family history of disease. Counseling  Your health care provider may ask you questions about your: Alcohol use. Tobacco use. Drug use. Emotional well-being. Home and relationship well-being. Sexual activity. Eating habits. History of falls. Memory and ability to understand (cognition). Work and work Astronomer. Reproductive health. Screening  You may have the following tests or measurements: Height, weight, and BMI. Blood pressure. Lipid and cholesterol levels. These may be checked every 5 years, or more frequently if you are over 72 years old. Skin check. Lung cancer screening. You may have this screening every year starting at age 72 if you have a 30-pack-year history of smoking and currently smoke or have quit within the past 15 years. Fecal occult blood test (FOBT) of the stool. You may have this test every year starting at age 72. Flexible sigmoidoscopy or colonoscopy. You may have a sigmoidoscopy every 5 years or a colonoscopy every 10 years starting at age 72. Hepatitis C blood test. Hepatitis B blood test. Sexually transmitted disease (STD) testing. Diabetes screening. This is done by checking your blood sugar (glucose) after you have not eaten for a while (fasting). You may  have this done every 1-3 years. Bone density scan. This is done to screen for osteoporosis. You may have this done starting at age 72. Mammogram. This may be done every 1-2 years. Talk to your health care provider about how often you should have regular mammograms. Talk with your health care provider about your test results, treatment options, and if necessary, the need for more tests. Vaccines  Your health  care provider may recommend certain vaccines, such as: Influenza vaccine. This is recommended every year. Tetanus, diphtheria, and acellular pertussis (Tdap, Td) vaccine. You may need a Td booster every 10 years. Zoster vaccine. You may need this after age 72. Pneumococcal 13-valent conjugate (PCV13) vaccine. One dose is recommended after age 72. Pneumococcal polysaccharide (PPSV23) vaccine. One dose is recommended after age 72. Talk to your health care provider about which screenings and vaccines you need and how often you need them. This information is not intended to replace advice given to you by your health care provider. Make sure you discuss any questions you have with your health care provider. Document Released: 01/04/2016 Document Revised: 08/27/2016 Document Reviewed: 10/09/2015 Elsevier Interactive Patient Education  2017 [employer] Prevention in the Home Falls can cause injuries. They can happen to people of all ages. There are many things you can do to make your home safe and to help prevent falls. What can I do on the outside of my home? Regularly fix the edges of walkways and driveways and fix any cracks. Remove anything that might make you trip as you walk through a door, such as a raised step or threshold. Trim any bushes or trees on the path to your home. Use bright outdoor lighting. Clear any walking paths of anything that might make someone trip, such as rocks or tools. Regularly check to see if handrails are loose or broken. Make sure that both sides of any steps have handrails. Any raised decks and porches should have guardrails on the edges. Have any leaves, snow, or ice cleared regularly. Use sand or salt on walking paths during winter. Clean up any spills in your garage right away. This includes oil or grease spills. What can I do in the bathroom? Use night lights. Install grab bars by the toilet and in the tub and shower. Do not use towel bars as grab  bars. Use non-skid mats or decals in the tub or shower. If you need to sit down in the shower, use a plastic, non-slip stool. Keep the floor dry. Clean up any water that spills on the floor as soon as it happens. Remove soap buildup in the tub or shower regularly. Attach bath mats securely with double-sided non-slip rug tape. Do not have throw rugs and other things on the floor that can make you trip. What can I do in the bedroom? Use night lights. Make sure that you have a light by your bed that is easy to reach. Do not use any sheets or blankets that are too big for your bed. They should not hang down onto the floor. Have a firm chair that has side arms. You can use this for support while you get dressed. Do not have throw rugs and other things on the floor that can make you trip. What can I do in the kitchen? Clean up any spills right away. Avoid walking on wet floors. Keep items that you use a lot in easy-to-reach places. If you need to reach something above you, use a strong step  stool that has a grab bar. Keep electrical cords out of the way. Do not use floor polish or wax that makes floors slippery. If you must use wax, use non-skid floor wax. Do not have throw rugs and other things on the floor that can make you trip. What can I do with my stairs? Do not leave any items on the stairs. Make sure that there are handrails on both sides of the stairs and use them. Fix handrails that are broken or loose. Make sure that handrails are as long as the stairways. Check any carpeting to make sure that it is firmly attached to the stairs. Fix any carpet that is loose or worn. Avoid having throw rugs at the top or bottom of the stairs. If you do have throw rugs, attach them to the floor with carpet tape. Make sure that you have a light switch at the top of the stairs and the bottom of the stairs. If you do not have them, ask someone to add them for you. What else can I do to help prevent  falls? Wear shoes that: Do not have high heels. Have rubber bottoms. Are comfortable and fit you well. Are closed at the toe. Do not wear sandals. If you use a stepladder: Make sure that it is fully opened. Do not climb a closed stepladder. Make sure that both sides of the stepladder are locked into place. Ask someone to hold it for you, if possible. Clearly mark and make sure that you can see: Any grab bars or handrails. First and last steps. Where the edge of each step is. Use tools that help you move around (mobility aids) if they are needed. These include: Canes. Walkers. Scooters. Crutches. Turn on the lights when you go into a dark area. Replace any light bulbs as soon as they burn out. Set up your furniture so you have a clear path. Avoid moving your furniture around. If any of your floors are uneven, fix them. If there are any pets around you, be aware of where they are. Review your medicines with your doctor. Some medicines can make you feel dizzy. This can increase your chance of falling. Ask your doctor what other things that you can do to help prevent falls. This information is not intended to replace advice given to you by your health care provider. Make sure you discuss any questions you have with your health care provider. Document Released: 10/04/2009 Document Revised: 05/15/2016 Document Reviewed: 01/12/2015 Elsevier Interactive Patient Education  2017 [employer].

## 2023-06-24 NOTE — Progress Notes (Signed)
Subjective:   Barbara Munoz is a 72 y.o. female who presents for Medicare Annual (Subsequent) preventive examination.  Visit Complete: Virtual  I connected with  Barbara Munoz on 06/24/23 by a audio enabled telemedicine application and verified that I am speaking with the correct person using two identifiers.  Patient Location: Home  Provider Location: Home Office  I discussed the limitations of evaluation and management by telemedicine. The patient expressed understanding and agreed to proceed.  Patient Medicare AWV questionnaire was completed by the patient on 06/23/2023; I have confirmed that all information answered by patient is correct and no changes since this date.  Review of Systems     Cardiac Risk Factors include: advanced age (>51men, >34 women);dyslipidemia     Objective:    Today's Vitals   06/24/23 0841  Weight: 153 lb (69.4 kg)  Height: 5\' 3"  (1.6 m)  PainSc: 2    Body mass index is 27.1 kg/m.     06/24/2023    8:56 AM 04/23/2022    1:07 PM 05/17/2021   12:39 PM 04/17/2021    2:57 PM 03/30/2017   12:27 PM 08/15/2015   11:48 AM  Advanced Directives  Does Patient Have a Medical Advance Directive? Yes Yes Yes Yes Yes No  Type of Estate agent of [place];Living will Healthcare Power of [employer] of [place];Living will Healthcare Power of [place];Living will    Does patient want to make changes to medical advance directive?   No - Patient declined     Copy of Healthcare Power of Attorney in Chart? No - copy requested No - copy requested  No - copy requested      Current Medications (verified) Outpatient Encounter Medications as of 06/24/2023  Medication Sig   ALPRAZolam (XANAX) 0.25 MG tablet TAKE 1 TABLET BY MOUTH 2 TIMES DAILY AS NEEDED.   fludrocortisone (FLORINEF) 0.1 MG tablet Take 1 tablet (100 mcg total) by mouth daily.   fluticasone (FLONASE) 50 MCG/ACT nasal spray SPRAY 2 SPRAYS INTO EACH NOSTRIL EVERY DAY    loratadine (CLARITIN) 10 MG tablet Take 10 mg by mouth daily as needed. Spring and fall   Melatonin 5 MG CAPS Take 5 mg by mouth as needed.   meloxicam (MOBIC) 7.5 MG tablet TAKE 1 TO 2 TABLETS EVERY DAY AS NEEDED FOR MUSCULOSKELETAL PAIN   METRONIDAZOLE, TOPICAL, 0.75 % LOTN Apply 1 Application topically daily. (Patient taking differently: Apply 1 Application topically as needed.)   midodrine (PROAMATINE) 5 MG tablet TAKE 1 TABLET (5 MG TOTAL) BY MOUTH 3 (THREE) TIMES DAILY WITH MEALS.   pantoprazole (PROTONIX) 40 MG tablet TAKE 1 TABLET BY MOUTH DAILY AS NEEDED   sertraline (ZOLOFT) 100 MG tablet TAKE 2 TABLETS BY MOUTH AT BEDTIME   simvastatin (ZOCOR) 40 MG tablet TAKE 1 TABLET BY MOUTH DAILY AT 6 PM.   valACYclovir (VALTREX) 1000 MG tablet Take 1 tablet (1,000 mg total) by mouth daily.   aspirin 81 MG tablet Take 81 mg by mouth daily. (Patient not taking: Reported on 06/24/2023)   No facility-administered encounter medications on file as of 06/24/2023.    Allergies (verified) Patient has no known allergies.   History: Past Medical History:  Diagnosis Date   Anxiety and depression 09/08/2011   Diverticulosis    12/2022 colonoscopy   Female bladder prolapse    GERD (gastroesophageal reflux disease) 09/05/2011   Improved signif with PPI   History of adenomatous polyp of colon 08/2017   History  of Clostridium difficile infection 2007   colonic changes c/w with this dx seen on colonoscopy 2007---sx's responded to flagyl.   History of cyst of breast    multiple drained during menopause   Hyperlipidemia    Hypotension 2022   ?idiopathic   IFG (impaired fasting glucose) 06/2018   Fastings 105-115 range when pt checked with husband's glucometer.  Fasting gluc here was 98 and A1c 5.6% 07/01/18.   Insomnia 09/05/2011   Migraine    worst during menopause   Orthostatic hypotension 11/20/2021   Overweight (BMI 25.0-29.9) 09/05/2011   Seasonal allergic rhinitis    Subclinical hypothyroidism     Vertigo, benign positional 09/08/2011   Past Surgical History:  Procedure Laterality Date   CARDIOVASCULAR STRESS TEST  02/15/2015   Exercise myocard perfusion testing: Normal EF, normal wall motion, normal perfusion.  Poor exercise capacity (4 min).   COLONOSCOPY  07/2006; 08/25/17   2007: pseudomembranous colitis, o/w normal. (Dr. Christella Hartigan).  08/2017: adenomas.  12/2022 adenomas.   COSMETIC SURGERY     DEXA  06/19/09; 01/19/2019   Normal 2010, 2020, 2024   ESOPHAGOGASTRODUODENOSCOPY     01/02/23, normal   POSTERIOR REPAIR  2012   REFRACTIVE SURGERY  05/2022   TONSILLECTOMY  1982   TRANSTHORACIC ECHOCARDIOGRAM  01/06/2022   NORMAL   Transvaginal ultrasound  10/2016   Normal (done for LLQ pain by Dr. Pamalee Leyden).   TUBAL LIGATION  1983   vaginal hernia repair  05/2011   rectocele repair   Family History  Problem Relation Age of Onset   Diabetes Mother 65       type 2   Atrial fibrillation Mother    Aneurysm Father        aortic   Cancer Father        prostate and lung/ smoker   Pernicious anemia Father    Hyperlipidemia Sister    Hypertension Sister    Diabetes Sister        type 2   Other Sister        degenerative disc disease   Thyroid disease Sister    Cancer Sister        breast  cancer s/p dbl mastectomy doing well   Hypertension Sister    Hyperlipidemia Sister    Diabetes Sister        type 2   Breast cancer Sister 45   Hyperlipidemia Brother    Allergies Brother    Breast cancer Maternal Grandmother 72   Heart attack Maternal Grandfather    Heart disease Paternal Grandmother        CHF   Pernicious anemia Paternal Grandmother    Kidney disease Paternal Grandfather    Stroke Paternal Grandfather    Diabetes Daughter        type 1   Allergies Daughter    Colon cancer Neg Hx    Esophageal cancer Neg Hx    Stomach cancer Neg Hx    Rectal cancer Neg Hx    Social History   Socioeconomic History   Marital status: Married    Spouse name: Barbara Munoz    Number of children: 2   Years of education: Not on file   Highest education level: Master's degree (e.g., MA, MS, MEng, MEd, MSW, MBA)  Occupational History   Occupation: retired  Tobacco Use   Smoking status: Never   Smokeless tobacco: Never   Tobacco comments:    smoked for about 6 months  Vaping Use   Vaping  Use: Never used  Substance and Sexual Activity   Alcohol use: Yes    Comment: moderate use   Drug use: No   Sexual activity: Yes    Birth control/protection: Post-menopausal  Other Topics Concern   Not on file  Social History Narrative   Married.   Nonsmoker.   2 daughters live within an hour   Husband has dementia   Social Determinants of Health   Financial Resource Strain: Low Risk  (06/23/2023)   Overall Financial Resource Strain (CARDIA)    Difficulty of Paying Living Expenses: Not hard at all  Food Insecurity: No Food Insecurity (06/23/2023)   Hunger Vital Sign    Worried About Running Out of Food in the Last Year: Never true    Ran Out of Food in the Last Year: Never true  Transportation Needs: No Transportation Needs (06/23/2023)   PRAPARE - Administrator, Civil Service (Medical): No    Lack of Transportation (Non-Medical): No  Physical Activity: Insufficiently Active (06/23/2023)   Exercise Vital Sign    Days of Exercise per Week: 4 days    Minutes of Exercise per Session: 20 min  Stress: No Stress Concern Present (06/23/2023)   [employer] of Occupational Health - Occupational Stress Questionnaire    Feeling of Stress : Only a little  Social Connections: Socially Integrated (06/23/2023)   Social Connection and Isolation Panel [NHANES]    Frequency of Communication with Friends and Family: More than three times a week    Frequency of Social Gatherings with Friends and Family: Once a week    Attends Religious Services: More than 4 times per year    Active Member of [employer] or Organizations: Yes    Attends Engineer, structural: More than 4  times per year    Marital Status: Married    Tobacco Counseling Counseling given: Not Answered Tobacco comments: smoked for about 6 months   Clinical Intake:  Pre-visit preparation completed: Yes  Pain : 0-10 Pain Score: 2  Pain Type: Chronic pain Pain Location: Generalized Pain Descriptors / Indicators: Aching Pain Onset: More than a month ago Pain Frequency: Intermittent     BMI - recorded: 27.1 Nutritional Status: BMI 25 -29 Overweight Nutritional Risks: None Diabetes: No  How often do you need to have someone help you when you read instructions, pamphlets, or other written materials from your doctor or pharmacy?: 1 - Never  Interpreter Needed?: No  Information entered by :: Lailani Tool, LPN   Activities of Daily Living    06/23/2023    8:36 PM  In your present state of health, do you have any difficulty performing the following activities:  Hearing? 0  Vision? 1  Comment eyes are tired a lot - seeing [facility]  Difficulty concentrating or making decisions? 1  Comment mild trouble concentrating  Walking or climbing stairs? 0  Dressing or bathing? 0  Doing errands, shopping? 0  Preparing Food and eating ? N  Using the Toilet? N  In the past six months, have you accidently leaked urine? Y  Comment mild - wears liners  Do you have problems with loss of bowel control? N  Managing your Medications? N  Managing your Finances? N  Housekeeping or managing your Housekeeping? N    Patient Care Team: Jeoffrey Massed, MD as PCP - General (Family Medicine) Chilton Si, MD as PCP - Cardiology (Cardiology) Silverio Lay, MD as Consulting Physician (Obstetrics and Gynecology) Rachael Fee, MD as  Attending Physician (Gastroenterology) Janalyn Harder, MD (Inactive) as Consulting Physician (Dermatology) Olivia Canter, MD as Consulting Physician (Ophthalmology) Chilton Si, MD as Consulting Physician (Cardiology) Arnaldo Natal, NP as  Nurse Practitioner (Gastroenterology) Armbruster, Willaim Rayas, MD as Consulting Physician (Gastroenterology)  Indicate any recent Medical Services you may have received from other than [facility] providers in the past year (date may be approximate).     Assessment:   This is a routine wellness examination for [facility].  Hearing/Vision screen Hearing Screening - Comments:: Denies hearing difficulties    Dietary issues and exercise activities discussed:     Goals Addressed             This Visit's Progress    COMPLETED: Patient Stated       Improve urinary leakage with Physical Therapy Completed PT - didn't help     Patient Stated   On track    Stronger and work on balance  Learn how to relax and enjoy life      Depression Screen    06/24/2023    8:51 AM 02/26/2023    9:57 AM 05/12/2022   12:11 PM 05/12/2022   10:49 AM 04/23/2022    1:05 PM 02/10/2022   10:25 AM 08/08/2021   10:33 AM  PHQ 2/9 Scores  PHQ - 2 Score 1 2 2 2  0 2 1  PHQ- 9 Score  8 6   7 6     Fall Risk    06/23/2023    8:36 PM 08/22/2022    1:48 PM 08/22/2022    8:36 AM 05/12/2022   10:49 AM 04/23/2022    1:10 PM  Fall Risk   Falls in the past year? 1 1 1 1 1   Comment tripped doing yard work - not fall risk/ no balance problems      Number falls in past yr: 1 1 1 1 1   Injury with Fall? 0 0 0 0 0  Risk for fall due to : History of fall(s) Impaired vision  Impaired vision;Impaired balance/gait Impaired vision;Impaired balance/gait  Follow up Falls evaluation completed;Education provided;Falls prevention discussed Falls evaluation completed  Falls evaluation completed Falls prevention discussed    MEDICARE RISK AT HOME:  Medicare Risk at Home - 06/23/23 2036     If so, are there any without handrails? No             TIMED UP AND GO:  Was the test performed?  No    Cognitive Function:        06/24/2023    8:59 AM 04/23/2022    1:12 PM  6CIT Screen  What Year? 0 points 0 points  What month? 0 points 0 points   What time? 0 points 0 points  Count back from 20 0 points 0 points  Months in reverse 0 points 0 points  Repeat phrase 0 points 0 points  Total Score 0 points 0 points    Immunizations Immunization History  Administered Date(s) Administered   Fluad Quad(high Dose 65+) 10/31/2019, 11/06/2020, 08/30/2021   Hepatitis A 03/17/2008   Hepatitis B 01/10/2001, 11/01/2003, 12/01/2003, 04/23/2004   IPV 03/17/2008   Influenza Split 09/05/2011, 08/27/2012   Influenza, High Dose Seasonal PF 09/23/2017, 09/16/2018, 10/10/2022   Influenza,inj,Quad PF,6+ Mos 08/30/2013, 11/06/2014, 10/11/2015, 09/12/2016   Influenza-Unspecified 11/06/2020   MMR 05/22/2008   Moderna Covid-19 Vaccine Bivalent Booster 57yrs & up 12/04/2021   Moderna SARS-COV2 Booster Vaccination 08/07/2021   Moderna Sars-Covid-2 Vaccination 01/10/2020, 02/07/2020, 11/07/2020   Pneumococcal  Conjugate-13 06/30/2017   Pneumococcal Polysaccharide-23 07/01/2018   Tdap 12/22/2005, 06/02/2008, 06/26/2016   Typhoid Live 03/17/2008   Yellow Fever 03/17/2008   Zoster Recombinant(Shingrix) 06/05/2022, 10/01/2022    TDAP status: Up to date  Flu Vaccine status: Up to date  Pneumococcal vaccine status: Up to date  Covid-19 vaccine status: Declined, Education has been provided regarding the importance of this vaccine but patient still declined. Advised may receive this vaccine at local pharmacy or Health Dept.or vaccine clinic. Aware to provide a copy of the vaccination record if obtained from local pharmacy or Health Dept. Verbalized acceptance and understanding. (She had several, but likely won't get additional vaccines)  Qualifies for Shingles Vaccine? Yes   Zostavax completed No   Shingrix Completed?: Yes  Screening Tests Health Maintenance  Topic Date Due   COVID-19 Vaccine (5 - 2023-24 season) 08/22/2022   INFLUENZA VACCINE  07/23/2023   MAMMOGRAM  08/29/2023   Medicare Annual Wellness (AWV)  06/23/2024   DEXA SCAN  03/04/2025    Colonoscopy  01/02/2026   DTaP/Tdap/Td (4 - Td or Tdap) 06/26/2026   Pneumonia Vaccine 25+ Years old  Completed   Hepatitis C Screening  Completed   Zoster Vaccines- Shingrix  Completed   HPV VACCINES  Aged Out    Health Maintenance  Health Maintenance Due  Topic Date Due   COVID-19 Vaccine (5 - 2023-24 season) 08/22/2022    Colorectal cancer screening: Type of screening: Colonoscopy. Completed 01/02/2023. Repeat every 3 years  Mammogram status: Completed 08/28/2022. Repeat every year  Bone Density status: Completed 03/05/2023. Results reflect: Bone density results: NORMAL. Repeat every 2 years.  Lung Cancer Screening: (Low Dose CT Chest recommended if Age 19-80 years, 20 pack-year currently smoking OR have quit w/in 15years.) does not qualify.   Lung Cancer Screening Referral: n/a  Additional Screening:  Hepatitis C Screening: does qualify; Completed 07/04/2013  Vision Screening: Recommended annual ophthalmology exams for early detection of glaucoma and other disorders of the eye. Is the patient up to date with their annual eye exam?  Yes  Who is the provider or what is the name of the office in which the patient attends annual eye exams? [facility] If pt is not established with a provider, would they like to be referred to a provider to establish care? No .   Dental Screening: Recommended annual dental exams for proper oral hygiene  Community Resource Referral / Chronic Care Management: CRR required this visit?  No   CCM required this visit?  No     Plan:     I have personally reviewed and noted the following in the patient's chart:   Medical and social history Use of alcohol, tobacco or illicit drugs  Current medications and supplements including opioid prescriptions. Patient is not currently taking opioid prescriptions. Functional ability and status Nutritional status Physical activity Advanced directives List of other physicians Hospitalizations, surgeries, and  ER visits in previous 12 months Vitals Screenings to include cognitive, depression, and falls Referrals and appointments  In addition, I have reviewed and discussed with patient certain preventive protocols, quality metrics, and best practice recommendations. A written personalized care plan for preventive services as well as general preventive health recommendations were provided to patient.     Shaquina Gillham E Laine Giovanetti, LPN   07/23/9561   After Visit Summary: (MyChart) Due to this being a telephonic visit, the after visit summary with patients personalized plan was offered to patient via MyChart   Nurse Notes: BP this morning 99/65 per  patient

## 2023-07-06 ENCOUNTER — Encounter: Payer: Self-pay | Admitting: Family Medicine

## 2023-07-07 NOTE — Telephone Encounter (Signed)
 No further action needed.

## 2023-07-21 DIAGNOSIS — Z13820 Encounter for screening for osteoporosis: Secondary | ICD-10-CM | POA: Diagnosis not present

## 2023-07-21 DIAGNOSIS — Z124 Encounter for screening for malignant neoplasm of cervix: Secondary | ICD-10-CM | POA: Diagnosis not present

## 2023-07-21 DIAGNOSIS — R3914 Feeling of incomplete bladder emptying: Secondary | ICD-10-CM | POA: Diagnosis not present

## 2023-07-21 DIAGNOSIS — Z1211 Encounter for screening for malignant neoplasm of colon: Secondary | ICD-10-CM | POA: Diagnosis not present

## 2023-07-21 DIAGNOSIS — Z6826 Body mass index (BMI) 26.0-26.9, adult: Secondary | ICD-10-CM | POA: Diagnosis not present

## 2023-07-21 DIAGNOSIS — Z01419 Encounter for gynecological examination (general) (routine) without abnormal findings: Secondary | ICD-10-CM | POA: Diagnosis not present

## 2023-07-21 DIAGNOSIS — Z1239 Encounter for other screening for malignant neoplasm of breast: Secondary | ICD-10-CM | POA: Diagnosis not present

## 2023-07-21 DIAGNOSIS — N393 Stress incontinence (female) (male): Secondary | ICD-10-CM | POA: Diagnosis not present

## 2023-08-06 ENCOUNTER — Encounter (INDEPENDENT_AMBULATORY_CARE_PROVIDER_SITE_OTHER): Payer: Self-pay

## 2023-08-23 ENCOUNTER — Other Ambulatory Visit: Payer: Self-pay | Admitting: Family Medicine

## 2023-08-28 ENCOUNTER — Telehealth: Payer: Self-pay | Admitting: Cardiovascular Disease

## 2023-08-28 NOTE — Telephone Encounter (Signed)
Patient is calling because been feeling like an extra beat in her chest. Patient states she has checked her BP and HR when this happens, but they have all been good. Patient states the feeling she gets is very hard to put into words. Patient is scheduled with her PCP for 09/09. Please advise.

## 2023-08-28 NOTE — Telephone Encounter (Signed)
Spoke with patient who stated she is feeling like a "blip" in her chest Denies fast heart rate, pain, or any other symptoms She is concerned since her mother has had Afib for over 20 years and her sister was recently diagnosed with it.  Not having daily but yesterday happened several times  Did have caffeine and was a more stressful day than normal.  Has follow up with Dr Duke Salvia in December, PCP 9/9 Advised patient to avoid caffeine and stay hydrated. Keep appointments as scheduled and call back if PCP recommends sooner appointment or worsening in symptoms.  She will be more aware of whether happens with caffeine/stress

## 2023-08-28 NOTE — Patient Instructions (Signed)

## 2023-08-31 ENCOUNTER — Ambulatory Visit: Payer: Medicare PPO | Admitting: Family Medicine

## 2023-08-31 VITALS — BP 110/72 | HR 71 | Temp 97.2°F | Ht 63.0 in | Wt 155.6 lb

## 2023-08-31 DIAGNOSIS — F411 Generalized anxiety disorder: Secondary | ICD-10-CM | POA: Diagnosis not present

## 2023-08-31 DIAGNOSIS — F339 Major depressive disorder, recurrent, unspecified: Secondary | ICD-10-CM

## 2023-08-31 DIAGNOSIS — E7800 Pure hypercholesterolemia, unspecified: Secondary | ICD-10-CM

## 2023-08-31 DIAGNOSIS — Z1283 Encounter for screening for malignant neoplasm of skin: Secondary | ICD-10-CM | POA: Diagnosis not present

## 2023-08-31 DIAGNOSIS — Z79899 Other long term (current) drug therapy: Secondary | ICD-10-CM

## 2023-08-31 DIAGNOSIS — Z23 Encounter for immunization: Secondary | ICD-10-CM | POA: Diagnosis not present

## 2023-08-31 DIAGNOSIS — I950 Idiopathic hypotension: Secondary | ICD-10-CM | POA: Diagnosis not present

## 2023-08-31 MED ORDER — SERTRALINE HCL 100 MG PO TABS: ORAL_TABLET | ORAL | 3 refills | Status: DC

## 2023-08-31 NOTE — Progress Notes (Signed)
OFFICE VISIT  09/16/2023  CC:  Chief Complaint  Patient presents with   Medical Management of Chronic Issues    Pt is not fasting.    Patient is a 72 y.o. female who presents for 29-month follow-up of idiopathic hypotension as well as anxiety and depression. A/P as of last visit: "1 idiopathic hypotension. Doing well on Florinef 0.1 mg daily and midodrine 5 mg 3 times a day.  Liberal salt intake.  Compression stockings as needed.   #2 hyperlipidemia, doing well on simvastatin 40 mg a day. Lipid panel and hepatic panel today.   #3 GAD with recurrent major depressive disorder. Stable on sertraline 200 mg a day.  She uses alprazolam 0.25 mg on only an occasional basis.   #4 vitamin B12 deficiency. She has not taken her B12 supplement in the last 3 months or so. Check B12 level today."  INTERIM HX: Barbara Munoz is doing okay. Her chronic fatigue is stable. She has some episodes of bodyaches.  Some occasional brief palpitations without any other symptoms accompanying this.  She stopped caffeine and these episodes decreased but have not completely resolved.  6 months ago her LDL was 119.  Rather than change her 40 mg simvastatin to a more potent statin we chose to hold off and recheck today to see if any further trend upward.  Anxiety level and mood have been pretty stable overall. PMP AWARE reviewed today: most recent rx for alprazolam was filled 07/13/2023, # 60, rx by me. No red flags.  ROS as above, plus--> no fevers, no CP, no SOB, no wheezing, no cough, no dizziness, no HAs, no rashes, no melena/hematochezia.  No polyuria or polydipsia.  No joint swelling or rightness.  No focal weakness, paresthesias, or tremors.  No acute vision or hearing abnormalities.  No dysuria or unusual/new urinary urgency or frequency.  No recent changes in lower legs. No n/v/d or abd pain.   Past Medical History:  Diagnosis Date   Anxiety and depression 09/08/2011   Diverticulosis    12/2022 colonoscopy    Female bladder prolapse    GERD (gastroesophageal reflux disease) 09/05/2011   Improved signif with PPI   History of adenomatous polyp of colon 08/2017   History of Clostridium difficile infection 2007   colonic changes c/w with this dx seen on colonoscopy 2007---sx's responded to flagyl.   History of cyst of breast    multiple drained during menopause   Hyperlipidemia    Hypotension 2022   ?idiopathic   IFG (impaired fasting glucose) 06/2018   Fastings 105-115 range when pt checked with husband's glucometer.  Fasting gluc here was 98 and A1c 5.6% 07/01/18.   Insomnia 09/05/2011   Migraine    worst during menopause   Orthostatic hypotension 11/20/2021   Overweight (BMI 25.0-29.9) 09/05/2011   Seasonal allergic rhinitis    Subclinical hypothyroidism    Vertigo, benign positional 09/08/2011    Past Surgical History:  Procedure Laterality Date   CARDIOVASCULAR STRESS TEST  02/15/2015   Exercise myocard perfusion testing: Normal EF, normal wall motion, normal perfusion.  Poor exercise capacity (4 min).   COLONOSCOPY  07/2006; 08/25/17   2007: pseudomembranous colitis, o/w normal. (Dr. Christella Hartigan).  08/2017: adenomas.  12/2022 adenomas.   Coronary calcium score     2024 ZERO   COSMETIC SURGERY     DEXA  06/19/09; 01/19/2019   Normal 2010, 2020, 2024   ESOPHAGOGASTRODUODENOSCOPY     01/02/23, normal   POSTERIOR REPAIR  2012   REFRACTIVE  SURGERY  05/2022   TONSILLECTOMY  1982   TRANSTHORACIC ECHOCARDIOGRAM  01/06/2022   NORMAL   Transvaginal ultrasound  10/2016   Normal (done for LLQ pain by Dr. Pamalee Leyden).   TUBAL LIGATION  1983   vaginal hernia repair  05/2011   rectocele repair    Outpatient Medications Prior to Visit  Medication Sig Dispense Refill   ALPRAZolam (XANAX) 0.25 MG tablet TAKE 1 TABLET BY MOUTH 2 TIMES DAILY AS NEEDED. 60 tablet 1   fludrocortisone (FLORINEF) 0.1 MG tablet Take 1 tablet (100 mcg total) by mouth daily. 90 tablet 2   fluticasone (FLONASE) 50 MCG/ACT  nasal spray SPRAY 2 SPRAYS INTO EACH NOSTRIL EVERY DAY 16 g 2   loratadine (CLARITIN) 10 MG tablet Take 10 mg by mouth daily as needed. Spring and fall     Melatonin 5 MG CAPS Take 5 mg by mouth as needed.     meloxicam (MOBIC) 7.5 MG tablet TAKE 1 TO 2 TABLETS EVERY DAY AS NEEDED FOR MUSCULOSKELETAL PAIN 60 tablet 2   midodrine (PROAMATINE) 5 MG tablet TAKE 1 TABLET (5 MG TOTAL) BY MOUTH 3 (THREE) TIMES DAILY WITH MEALS. 270 tablet 3   pantoprazole (PROTONIX) 40 MG tablet TAKE 1 TABLET BY MOUTH DAILY AS NEEDED 90 tablet 3   valACYclovir (VALTREX) 1000 MG tablet Take 1 tablet (1,000 mg total) by mouth daily. 10 tablet 1   simvastatin (ZOCOR) 40 MG tablet TAKE 1 TABLET BY MOUTH DAILY AT 6 PM. 90 tablet 3   METRONIDAZOLE, TOPICAL, 0.75 % LOTN Apply 1 Application topically daily. (Patient not taking: Reported on 08/31/2023) 59 mL 0   aspirin 81 MG tablet Take 81 mg by mouth daily. (Patient not taking: Reported on 06/24/2023)     sertraline (ZOLOFT) 100 MG tablet TAKE 2 TABLETS BY MOUTH AT BEDTIME 10 tablet 0   No facility-administered medications prior to visit.    No Known Allergies  Review of Systems As per HPI  PE:    08/31/2023    1:10 PM 06/24/2023    8:41 AM 02/26/2023    9:55 AM  Vitals with BMI  Height 5\' 3"  5\' 3"  5\' 4"   Weight 155 lbs 10 oz 153 lbs 155 lbs 3 oz  BMI 27.57 27.11 26.63  Systolic 110 -- 115  Diastolic 72 -- 71  Pulse 71 -- 68     Physical Exam  Gen: Alert, well appearing.  Patient is oriented to person, place, time, and situation. AFFECT: pleasant, lucid thought and speech. No further exam today.  LABS:  Last CBC Lab Results  Component Value Date   WBC 6.2 02/26/2023   HGB 13.6 02/26/2023   HCT 40.7 02/26/2023   MCV 93.2 02/26/2023   MCH 31.8 05/17/2021   RDW 12.9 02/26/2023   PLT 174.0 02/26/2023   Last metabolic panel Lab Results  Component Value Date   GLUCOSE 81 08/31/2023   NA 142 08/31/2023   K 4.2 08/31/2023   CL 103 08/31/2023   CO2 30  08/31/2023   BUN 13 08/31/2023   CREATININE 0.63 08/31/2023   GFR 89.02 08/31/2023   CALCIUM 9.7 08/31/2023   PHOS 3.0 11/09/2012   PROT 6.6 02/26/2023   ALBUMIN 4.0 02/26/2023   BILITOT 0.5 02/26/2023   ALKPHOS 54 02/26/2023   AST 28 02/26/2023   ALT 28 02/26/2023   ANIONGAP 7 05/17/2021   Last lipids Lab Results  Component Value Date   CHOL 211 (H) 08/31/2023   HDL 51.80 08/31/2023  LDLCALC 116 (H) 08/31/2023   LDLDIRECT 110.6 10/26/2013   TRIG 215.0 (H) 08/31/2023   CHOLHDL 4 08/31/2023   Last hemoglobin A1c Lab Results  Component Value Date   HGBA1C 5.4 11/08/2019   Last thyroid functions Lab Results  Component Value Date   TSH 2.96 02/26/2023   T3TOTAL 112.4 06/30/2017   Last vitamin B12 and Folate Lab Results  Component Value Date   VITAMINB12 455 02/26/2023   IMPRESSION AND PLAN:  #1 idiopathic hypotension. Stable on Florinef 0.1 mg and midodrine 5 mg 3 times a day. Basic metabolic panel today.  2.  Hypercholesterolemia. Zocor 40 mg a day. Lipid panel today and if LDL not less than 100 we will switch her over to a more potent statin such as atorvastatin or rosuvastatin.  3.  GAD and recurrent major depressive disorder. Overall pretty stable. Her husband's chronic illnesses and progressive dementia played a role.  Continue sertraline 200 mg a day and Xanax 0.25 mg, 1 tab twice daily as needed.  #4 skin cancer screening. Patient's dermatologist is relocated so she requested new dermatology referral today.  An After Visit Summary was printed and given to the patient.  FOLLOW UP: Return in about 6 months (around 02/28/2024) for annual CPE (fasting).  Signed:  Santiago Bumpers, MD           09/16/2023

## 2023-09-01 LAB — BASIC METABOLIC PANEL
BUN: 13 mg/dL (ref 6–23)
CO2: 30 meq/L (ref 19–32)
Calcium: 9.7 mg/dL (ref 8.4–10.5)
Chloride: 103 meq/L (ref 96–112)
Creatinine, Ser: 0.63 mg/dL (ref 0.40–1.20)
GFR: 89.02 mL/min (ref >60.00)
Glucose, Bld: 81 mg/dL (ref 70–99)
Potassium: 4.2 meq/L (ref 3.5–5.1)
Sodium: 142 meq/L (ref 135–145)

## 2023-09-01 LAB — LIPID PANEL
Cholesterol: 211 mg/dL — ABNORMAL HIGH (ref 0–200)
HDL: 51.8 mg/dL (ref >39.00)
LDL Cholesterol: 116 mg/dL — ABNORMAL HIGH (ref 0–99)
NonHDL: 159.25
Total CHOL/HDL Ratio: 4
Triglycerides: 215 mg/dL — ABNORMAL HIGH (ref 0.0–149.0)
VLDL: 43 mg/dL — ABNORMAL HIGH (ref 0.0–40.0)

## 2023-09-02 ENCOUNTER — Other Ambulatory Visit: Payer: Self-pay | Admitting: Family Medicine

## 2023-09-02 DIAGNOSIS — E782 Mixed hyperlipidemia: Secondary | ICD-10-CM

## 2023-09-02 MED ORDER — ATORVASTATIN CALCIUM 20 MG PO TABS: 20.0000 mg | ORAL_TABLET | Freq: Every day | ORAL | 2 refills | Status: DC

## 2023-09-03 ENCOUNTER — Other Ambulatory Visit: Payer: Self-pay | Admitting: Family Medicine

## 2023-09-16 ENCOUNTER — Encounter: Payer: Self-pay | Admitting: Family Medicine

## 2023-09-21 DIAGNOSIS — H25813 Combined forms of age-related cataract, bilateral: Secondary | ICD-10-CM | POA: Diagnosis not present

## 2023-09-21 DIAGNOSIS — H10413 Chronic giant papillary conjunctivitis, bilateral: Secondary | ICD-10-CM | POA: Diagnosis not present

## 2023-09-21 DIAGNOSIS — H04123 Dry eye syndrome of bilateral lacrimal glands: Secondary | ICD-10-CM | POA: Diagnosis not present

## 2023-09-21 DIAGNOSIS — H43811 Vitreous degeneration, right eye: Secondary | ICD-10-CM | POA: Diagnosis not present

## 2023-10-08 ENCOUNTER — Other Ambulatory Visit: Payer: Self-pay | Admitting: Family Medicine

## 2023-10-11 ENCOUNTER — Other Ambulatory Visit: Payer: Self-pay | Admitting: Family Medicine

## 2023-10-18 ENCOUNTER — Other Ambulatory Visit: Payer: Self-pay | Admitting: Family Medicine

## 2023-10-25 ENCOUNTER — Other Ambulatory Visit (HOSPITAL_BASED_OUTPATIENT_CLINIC_OR_DEPARTMENT_OTHER): Payer: Self-pay | Admitting: Cardiovascular Disease

## 2023-10-29 ENCOUNTER — Other Ambulatory Visit: Payer: Self-pay | Admitting: Obstetrics and Gynecology

## 2023-10-29 DIAGNOSIS — Z1231 Encounter for screening mammogram for malignant neoplasm of breast: Secondary | ICD-10-CM

## 2023-11-27 ENCOUNTER — Ambulatory Visit (HOSPITAL_BASED_OUTPATIENT_CLINIC_OR_DEPARTMENT_OTHER): Payer: Medicare PPO | Admitting: Cardiovascular Disease

## 2023-11-27 ENCOUNTER — Encounter (HOSPITAL_BASED_OUTPATIENT_CLINIC_OR_DEPARTMENT_OTHER): Payer: Self-pay | Admitting: Cardiovascular Disease

## 2023-11-27 ENCOUNTER — Other Ambulatory Visit (HOSPITAL_BASED_OUTPATIENT_CLINIC_OR_DEPARTMENT_OTHER): Payer: Medicare PPO

## 2023-11-27 VITALS — BP 132/76 | HR 101 | Ht 63.0 in | Wt 156.8 lb

## 2023-11-27 DIAGNOSIS — R002 Palpitations: Secondary | ICD-10-CM | POA: Diagnosis not present

## 2023-11-27 DIAGNOSIS — I951 Orthostatic hypotension: Secondary | ICD-10-CM

## 2023-11-27 DIAGNOSIS — E7800 Pure hypercholesterolemia, unspecified: Secondary | ICD-10-CM | POA: Diagnosis not present

## 2023-11-27 NOTE — Progress Notes (Signed)
Cardiology Office Note:  .   Date:  11/27/2023  ID:  Barbara Munoz, DOB 1951-06-01, MRN [id number] PCP: Jeoffrey Massed, MD  [facility] Providers Cardiologist:  Chilton Si, MD    History of Present Illness: .    Barbara Munoz is a 72 y.o. female with a hx of hypotension, hyperlipidemia, GERD, anxiety, and obesity here today for follow up. She was first seen  10/10/2021 for orthostatic hypotension. She last saw Dr. Milinda Cave 10/24/2021 and complained of continued orthostatic hypotension with associated fatigue. Her condition was improved with florinef and midodrine. She was referred to cardiology. Cortisol and ACTH were within normal limits.  She was doing well and Midodrine was reduced to BID. She was struggling with stress and anxiety and we encouraged her to follow up with her therapist and continue with medication.   At her visit 11/2022 she was doing well.  She only had 1 episode of hypotension.  This improved with Pedialyte and rest.  Her blood pressures averaging in the 110s to 120s over 70s.  She was referred for a calcium score which revealed a score of 12/2022.  Barbara Munoz presented with intermittent episodes of what she described as a "blip" sensation in the chest, occurring at least once a week. These episodes were brief, lasting only a second, and were not associated with any other symptoms. She noted that these episodes were not daily and could be absent for days at a time. The frequency and duration of these episodes varied, with some days experiencing multiple episodes and others none. The patient denied any associated chest pain or pressure.  Her blood pressure has been well-controlled, with readings mostly within the goal range. She has been managing her blood pressure with Midodrine, usually taking two doses a day. The patient also reported being on Atorvastatin, a recent switch from Simvastatin for cholesterol management.   Barbara Munoz has been dealing with  significant emotional stress due to the recent loss of her spouse. Despite this, she has been keeping busy with various physical activities, including home renovation projects. She reported no issues with breathing during these activities.  Her family history is significant for atrial fibrillation, with both her mother and sister diagnosed with the condition. The patient expressed concern about her intermittent chest sensations in light of this family history. However, she also acknowledged that her symptoms did not align with her understanding of atrial fibrillation based on her family members' experiences.      ROS:  As per HPI  Studies Reviewed: [name] Kitchen   EKG Interpretation Date/Time:  Friday November 27 2023 13:29:04 EST Ventricular Rate:  81 PR Interval:  144 QRS Duration:  82 QT Interval:  398 QTC Calculation: 462 R Axis:   40  Text Interpretation: Normal sinus rhythm Normal ECG When compared with ECG of 17-May-2021 13:32, PREVIOUS ECG IS PRESENT Confirmed by Chilton Si ([id number]) on 11/27/2023 1:37:48 PM   Echo 12/2021:  1. Left ventricular ejection fraction, by estimation, is 55 to 60%. Left  ventricular ejection fraction by 2D MOD biplane is 58.6 %. The left  ventricle has normal function. The left ventricle has no regional wall  motion abnormalities. Left ventricular  diastolic parameters were normal.   2. Right ventricular systolic function is normal. The right ventricular  size is normal. Tricuspid regurgitation signal is inadequate for assessing  PA pressure.   3. The mitral valve is grossly normal. Mild mitral valve regurgitation.  No evidence of mitral stenosis.  4. The aortic valve is tricuspid. Aortic valve regurgitation is not  visualized. No aortic stenosis is present.   5. The inferior vena cava is normal in size with greater than 50%  respiratory variability, suggesting right atrial pressure of 3 mmHg.   Calcium score 12/2022: IMPRESSION: Coronary calcium score  of 0. This was 1st percentile for age-, race-, and sex-matched controls.  Risk Assessment/Calculations:             Physical Exam:   VS:  BP 132/76   Pulse (!) 101   Ht 5\' 3"  (1.6 m)   Wt 156 lb 12.8 oz (71.1 kg)   SpO2 95%   BMI 27.78 kg/m  , BMI Body mass index is 27.78 kg/m. GENERAL:  Well appearing HEENT: Pupils equal round and reactive, fundi not visualized, oral mucosa unremarkable NECK:  No jugular venous distention, waveform within normal limits, carotid upstroke brisk and symmetric, no bruits, no thyromegaly LUNGS:  Clear to auscultation bilaterally HEART:  RRR.  PMI not displaced or sustained,S1 and S2 within normal limits, no S3, no S4, no clicks, no rubs, no murmurs ABD:  Flat, positive bowel sounds normal in frequency in pitch, no bruits, no rebound, no guarding, no midline pulsatile mass, no hepatomegaly, no splenomegaly EXT:  2 plus pulses throughout, no edema, no cyanosis no clubbing SKIN:  No rashes no nodules NEURO:  Cranial nerves II through XII grossly intact, motor grossly intact throughout PSYCH:  Cognitively intact, oriented to person place and time   ASSESSMENT AND PLAN: .    # Palpitations Infrequent episodes of palpitations, possibly premature atrial contractions (PACs) as per PCP. Family history of atrial fibrillation. No sustained episodes. -Order 14-day cardiac monitor to capture episodes and confirm diagnosis.  # Hyperlipidemia Recent switch from Simvastatin 40mg  to Atorvastatin. Last cholesterol level slightly elevated. -Continue Atorvastatin. -Check lipid panel on 12/01/2023 to assess response to new medication.  # Orthostatic Hypotension Well managed on Florinef and Midodrine (average twice daily). -Continue current regimen.  Follow-up in 1 year or sooner if needed.       Signed, Chilton Si, MD

## 2023-11-27 NOTE — Patient Instructions (Signed)
Medication Instructions:  Your physician recommends that you continue on your current medications as directed. Please refer to the Current Medication list given to you today.  *If you need a refill on your cardiac medications before your next appointment, please call your pharmacy*  Lab Work: NONE  Testing/Procedures: 14 DAY ZIO   Follow-Up: At [facility], you and your health needs are our priority.  As part of our continuing mission to provide you with exceptional heart care, we have created designated Provider Care Teams.  These Care Teams include your primary Cardiologist (physician) and Advanced Practice Providers (APPs -  Physician Assistants and Nurse Practitioners) who all work together to provide you with the care you need, when you need it.  We recommend signing up for the patient portal called "MyChart".  Sign up information is provided on this After Visit Summary.  MyChart is used to connect with patients for Virtual Visits (Telemedicine).  Patients are able to view lab/test results, encounter notes, upcoming appointments, etc.  Non-urgent messages can be sent to your provider as well.   To learn more about what you can do with MyChart, go to [email or web address].    Your next appointment:   12 month(s)  Provider:   Chilton Si, MD or Gillian Shields, NP    Other Instructions  Barbara Munoz- Long Term Monitor Instructions  Your physician has requested you wear a ZIO patch monitor for 14 days.  This is a single patch monitor. [employer] supplies one patch monitor per enrollment. Additional stickers are not available. Please do not apply patch if you will be having a Nuclear Stress Test,  Echocardiogram, Cardiac CT, MRI, or Chest Xray during the period you would be wearing the  monitor. The patch cannot be worn during these tests. You cannot remove and re-apply the  ZIO XT patch monitor.  Your ZIO patch monitor will be mailed 3 day USPS to your address on file. It  may take 3-5 days  to receive your monitor after you have been enrolled.  Once you have received your monitor, please review the enclosed instructions. Your monitor  has already been registered assigning a specific monitor serial # to you.  Billing and Patient Assistance Program Information  We have supplied [employer] with any of your insurance information on file for billing purposes. [employer] offers a sliding scale Patient Assistance Program for patients that do not have  insurance, or whose insurance does not completely cover the cost of the ZIO monitor.  You must apply for the Patient Assistance Program to qualify for this discounted rate.  To apply, please call [employer] at [phone], select option 4, select option 2, ask to apply for  Patient Assistance Program. Barbara Munoz will ask your household income, and how many people  are in your household. They will quote your out-of-pocket cost based on that information.  [employer] will also be able to set up a 25-month, interest-free payment plan if needed.  Applying the monitor   Shave hair from upper left chest.  Hold abrader disc by orange tab. Rub abrader in 40 strokes over the upper left chest as  indicated in your monitor instructions.  Clean area with 4 enclosed alcohol pads. Let dry.  Apply patch as indicated in monitor instructions. Patch will be placed under collarbone on left  side of chest with arrow pointing upward.  Rub patch adhesive wings for 2 minutes. Remove white label marked "1". Remove the white  label marked "2". Rub patch adhesive wings for  2 additional minutes.  While looking in a mirror, press and release button in center of patch. A small green light will  flash 3-4 times. This will be your only indicator that the monitor has been turned on.  Do not shower for the first 24 hours. You may shower after the first 24 hours.  Press the button if you feel a symptom. You will hear a small click. Record Date, Time and  Symptom  in the Patient Logbook.  When you are ready to remove the patch, follow instructions on the last 2 pages of Patient  Logbook. Stick patch monitor onto the last page of Patient Logbook.  Place Patient Logbook in the blue and white box. Use locking tab on box and tape box closed  securely. The blue and white box has prepaid postage on it. Please place it in the mailbox as  soon as possible. Your physician should have your test results approximately 7 days after the  monitor has been mailed back to [facility].  Call [facility] Customer Care at [phone] if you have questions regarding  your ZIO XT patch monitor. Call them immediately if you see an orange light blinking on your  monitor.  If your monitor falls off in less than 4 days, contact our Monitor department at [phone].  If your monitor becomes loose or falls off after 4 days call [employer] at [phone] for  suggestions on securing your monitor

## 2023-11-28 ENCOUNTER — Other Ambulatory Visit: Payer: Self-pay | Admitting: Family Medicine

## 2023-12-01 ENCOUNTER — Other Ambulatory Visit (INDEPENDENT_AMBULATORY_CARE_PROVIDER_SITE_OTHER): Payer: Medicare PPO

## 2023-12-01 DIAGNOSIS — E782 Mixed hyperlipidemia: Secondary | ICD-10-CM | POA: Diagnosis not present

## 2023-12-01 LAB — ALT: ALT: 31 U/L (ref 0–35)

## 2023-12-01 LAB — LIPID PANEL
Cholesterol: 186 mg/dL (ref 0–200)
HDL: 46.9 mg/dL (ref >39.00)
LDL Cholesterol: 107 mg/dL — ABNORMAL HIGH (ref 0–99)
NonHDL: 139.45
Total CHOL/HDL Ratio: 4
Triglycerides: 164 mg/dL — ABNORMAL HIGH (ref 0.0–149.0)
VLDL: 32.8 mg/dL (ref 0.0–40.0)

## 2023-12-01 LAB — AST: AST: 28 U/L (ref 0–37)

## 2023-12-03 ENCOUNTER — Ambulatory Visit: Payer: Medicare PPO

## 2023-12-14 ENCOUNTER — Ambulatory Visit: Payer: Medicare PPO | Admitting: Family Medicine

## 2023-12-14 VITALS — BP 123/79 | HR 79 | Temp 98.2°F | Wt 150.2 lb

## 2023-12-14 DIAGNOSIS — J209 Acute bronchitis, unspecified: Secondary | ICD-10-CM

## 2023-12-14 DIAGNOSIS — R051 Acute cough: Secondary | ICD-10-CM

## 2023-12-14 DIAGNOSIS — J069 Acute upper respiratory infection, unspecified: Secondary | ICD-10-CM | POA: Diagnosis not present

## 2023-12-14 LAB — POCT INFLUENZA A/B
Influenza A, POC: NEGATIVE
Influenza B, POC: NEGATIVE

## 2023-12-14 MED ORDER — HYDROCODONE BIT-HOMATROP MBR 5-1.5 MG/5ML PO SOLN: ORAL | 0 refills | Status: DC

## 2023-12-14 MED ORDER — PREDNISONE 20 MG PO TABS: ORAL_TABLET | ORAL | 0 refills | Status: DC

## 2023-12-14 NOTE — Progress Notes (Signed)
OFFICE VISIT  12/14/2023  CC:  Chief Complaint  Patient presents with   Cough    since Thursday night. Headache, stuffy nose, scratchy throat. Tylenol, guaifenesin, and salt water gargle. "Sunday night pt started to experience cough and congestion. Denies fever. Pt tested for COVID on Friday and was negative.     Patient is a 72 y.o. female who presents for HA and stuffy nose.  HPI: About 5 days ago Barbara Munoz started to feel scratchy throat and nasal congestion.  Also some headache and fatigue followed by onset of coughing.  No fever.  Coughing getting worse the last 1 to 2 days. No shortness of breath, wheezing, or chest pain. No nausea, vomiting, or diarrhea. Home COVID test negative.  Barbara Munoz is in the midst of grieving and coping with the loss of her husband Andy a few months ago.  Past Medical History:  Diagnosis Date   Anxiety and depression 09/08/2011   Diverticulosis    12/2022 colonoscopy   Female bladder prolapse    GERD (gastroesophageal reflux disease) 09/05/2011   Improved signif with PPI   History of adenomatous polyp of colon 08/2017   History of Clostridium difficile infection 2007   colonic changes c/w with this dx seen on colonoscopy 2007---sx's responded to flagyl.   History of cyst of breast    multiple drained during menopause   Hyperlipidemia    Hypotension 2022   ?idiopathic   IFG (impaired fasting glucose) 06/2018   Fastings 105-115 range when pt checked with husband's glucometer.  Fasting gluc here was 98 and A1c 5.6% 07/01/18.   Insomnia 09/05/2011   Migraine    worst during menopause   Orthostatic hypotension 11/20/2021   Overweight (BMI 25.0-29.9) 09/05/2011   Seasonal allergic rhinitis    Subclinical hypothyroidism    Vertigo, benign positional 09/08/2011    Past Surgical History:  Procedure Laterality Date   CARDIOVASCULAR STRESS TEST  02/15/2015   Exercise myocard perfusion testing: Normal EF, normal wall motion, normal perfusion.  Poor  exercise capacity (4 min).   COLONOSCOPY  07/2006; 08/25/17   2007: pseudomembranous colitis, o/w normal. (Dr. Jacobs).  08/2017: adenomas.  12/2022 adenomas.   Coronary calcium score     20" 24 ZERO   COSMETIC SURGERY     DEXA  06/19/09; 01/19/2019   Normal 2010, 2020, 2024   ESOPHAGOGASTRODUODENOSCOPY     01/02/23, normal   POSTERIOR REPAIR  2012   REFRACTIVE SURGERY  05/2022   TONSILLECTOMY  1982   TRANSTHORACIC ECHOCARDIOGRAM  01/06/2022   NORMAL   Transvaginal ultrasound  10/2016   Normal (done for LLQ pain by Dr. Pamalee Leyden).   TUBAL LIGATION  1983   vaginal hernia repair  05/2011   rectocele repair    Outpatient Medications Prior to Visit  Medication Sig Dispense Refill   ALPRAZolam (XANAX) 0.25 MG tablet TAKE 1 TABLET BY MOUTH TWICE A DAY AS NEEDED 60 tablet 3   atorvastatin (LIPITOR) 20 MG tablet TAKE 1 TABLET BY MOUTH EVERY DAY 90 tablet 1   fludrocortisone (FLORINEF) 0.1 MG tablet TAKE 1 TABLET BY MOUTH EVERY DAY 90 tablet 3   fluticasone (FLONASE) 50 MCG/ACT nasal spray SPRAY 2 SPRAYS INTO EACH NOSTRIL EVERY DAY (Patient taking differently: Place 2 sprays into both nostrils daily as needed for allergies or rhinitis.) 16 g 2   loratadine (CLARITIN) 10 MG tablet Take 10 mg by mouth daily as needed. Spring and fall     Melatonin 5 MG CAPS Take 5 mg by  mouth as needed.     meloxicam (MOBIC) 7.5 MG tablet TAKE 1 TO 2 TABLETS EVERY DAY AS NEEDED FOR MUSCULOSKELETAL PAIN 60 tablet 2   midodrine (PROAMATINE) 5 MG tablet TAKE 1 TABLET (5 MG TOTAL) BY MOUTH 3 (THREE) TIMES DAILY WITH MEALS. (Patient taking differently: Take 5 mg by mouth 2 (two) times daily with a meal.) 270 tablet 3   pantoprazole (PROTONIX) 40 MG tablet TAKE 1 TABLET BY MOUTH DAILY AS NEEDED 90 tablet 3   sertraline (ZOLOFT) 100 MG tablet TAKE 2 TABLETS BY MOUTH AT BEDTIME 90 tablet 3   valACYclovir (VALTREX) 1000 MG tablet Take 1 tablet (1,000 mg total) by mouth daily. (Patient taking differently: Take 1,000 mg by  mouth daily as needed (fever blisters).) 10 tablet 1   No facility-administered medications prior to visit.    No Known Allergies  Review of Systems  As per HPI  PE:    12/14/2023    3:57 PM 11/27/2023    1:23 PM 08/31/2023    1:10 PM  Vitals with BMI  Height  5\' 3"  5\' 3"   Weight 150 lbs 3 oz 156 lbs 13 oz 155 lbs 10 oz  BMI 26.61 27.78 27.57  Systolic 123 132 191  Diastolic 79 76 72  Pulse 79 101 71    Physical Exam  VS: noted--normal. Gen: alert, NAD, NONTOXIC APPEARING. HEENT: eyes without injection, drainage, or swelling.  Ears: EACs clear, TMs with normal light reflex and landmarks.  Nose: Clear rhinorrhea, with some dried, crusty exudate adherent to mildly injected mucosa.  No purulent d/c.  No paranasal sinus TTP.  No facial swelling.  Throat and mouth without focal lesion.  No pharyngial swelling, erythema, or exudate.   Neck: supple, no LAD.   LUNGS: CTA bilat, nonlabored resps.   CV: RRR, no m/r/g. EXT: no c/c/e SKIN: no rash   LABS:  Last metabolic panel Lab Results  Component Value Date   GLUCOSE 81 08/31/2023   NA 142 08/31/2023   K 4.2 08/31/2023   CL 103 08/31/2023   CO2 30 08/31/2023   BUN 13 08/31/2023   CREATININE 0.63 08/31/2023   GFR 89.02 08/31/2023   CALCIUM 9.7 08/31/2023   PHOS 3.0 11/09/2012   PROT 6.6 02/26/2023   ALBUMIN 4.0 02/26/2023   BILITOT 0.5 02/26/2023   ALKPHOS 54 02/26/2023   AST 28 12/01/2023   ALT 31 12/01/2023   ANIONGAP 7 05/17/2021   IMPRESSION AND PLAN:  URI with acute bronchitis. Viral etiology suspected. Rapid flu A/B negative today.  Home COVID was negative. Prednisone 40 mg a day x 5 days. Hycodan syrup, 1 to 2 teaspoon twice daily as needed, 120 mL.  An After Visit Summary was printed and given to the patient.  FOLLOW UP: Return if symptoms worsen or fail to improve.  Signed:  Santiago Bumpers, MD           12/14/2023

## 2023-12-17 ENCOUNTER — Encounter: Payer: Self-pay | Admitting: Family Medicine

## 2023-12-23 HISTORY — PX: OTHER SURGICAL HISTORY: SHX169

## 2023-12-26 DIAGNOSIS — R002 Palpitations: Secondary | ICD-10-CM | POA: Diagnosis not present

## 2023-12-27 ENCOUNTER — Other Ambulatory Visit: Payer: Self-pay | Admitting: Family Medicine

## 2023-12-29 ENCOUNTER — Ambulatory Visit
Admission: RE | Admit: 2023-12-29 | Discharge: 2023-12-29 | Disposition: A | Discharge status: Home / Self Care | Payer: Medicare PPO | Source: Ambulatory Visit | Attending: Obstetrics and Gynecology | Admitting: Obstetrics and Gynecology

## 2023-12-29 DIAGNOSIS — Z1231 Encounter for screening mammogram for malignant neoplasm of breast: Secondary | ICD-10-CM

## 2024-01-11 ENCOUNTER — Encounter (HOSPITAL_BASED_OUTPATIENT_CLINIC_OR_DEPARTMENT_OTHER): Payer: Self-pay | Admitting: Cardiovascular Disease

## 2024-01-11 ENCOUNTER — Encounter: Payer: Self-pay | Admitting: Family Medicine

## 2024-02-16 ENCOUNTER — Encounter: Payer: Self-pay | Admitting: Obstetrics

## 2024-02-16 ENCOUNTER — Ambulatory Visit: Payer: Medicare PPO | Admitting: Obstetrics

## 2024-02-16 VITALS — BP 134/75 | HR 83 | Ht 62.99 in | Wt 151.8 lb

## 2024-02-16 DIAGNOSIS — R351 Nocturia: Secondary | ICD-10-CM | POA: Insufficient documentation

## 2024-02-16 DIAGNOSIS — R3914 Feeling of incomplete bladder emptying: Secondary | ICD-10-CM | POA: Diagnosis not present

## 2024-02-16 DIAGNOSIS — N952 Postmenopausal atrophic vaginitis: Secondary | ICD-10-CM

## 2024-02-16 DIAGNOSIS — N3946 Mixed incontinence: Secondary | ICD-10-CM | POA: Diagnosis not present

## 2024-02-16 DIAGNOSIS — N8110 Cystocele, unspecified: Secondary | ICD-10-CM | POA: Diagnosis not present

## 2024-02-16 DIAGNOSIS — R159 Full incontinence of feces: Secondary | ICD-10-CM | POA: Insufficient documentation

## 2024-02-16 DIAGNOSIS — Z9889 Other specified postprocedural states: Secondary | ICD-10-CM | POA: Diagnosis not present

## 2024-02-16 LAB — POCT URINALYSIS DIPSTICK
Bilirubin, UA: NEGATIVE
Blood, UA: NEGATIVE
Glucose, UA: NEGATIVE
Ketones, UA: NEGATIVE
Leukocytes, UA: NEGATIVE
Nitrite, UA: NEGATIVE
Protein, UA: NEGATIVE
Spec Grav, UA: 1.015 (ref 1.010–1.025)
Urobilinogen, UA: 0.2 U/dL
pH, UA: 5.5 (ref 5.0–8.0)

## 2024-02-16 MED ORDER — ESTRADIOL 0.1 MG/GM VA CREA: TOPICAL_CREAM | VAGINAL | 3 refills | Status: DC

## 2024-02-16 MED ORDER — GEMTESA 75 MG PO TABS: 75.0000 mg | ORAL_TABLET | Freq: Every day | ORAL | 2 refills | Status: DC

## 2024-02-16 MED ORDER — GEMTESA 75 MG PO TABS: 75.0000 mg | ORAL_TABLET | Freq: Every day | ORAL | Status: DC

## 2024-02-16 NOTE — Assessment & Plan Note (Signed)
-   POCT UA negative, bladder scan 12mL - repeat if clinical change

## 2024-02-16 NOTE — Assessment & Plan Note (Addendum)
-   history of rectocele repair in 2012, tight band of scar noted at posterior vaginal wall. Palpation of posterior vaginal wall recreates sensation of bladder urgency - reports constipation since, encouraged fiber supplementation to optimize stool consistency and avoid straining - encouraged to consider pelvic floor PT if refractory symptoms

## 2024-02-16 NOTE — Assessment & Plan Note (Addendum)
-   asymptomatic stage II POP - s/p rectocele repair, s/p pelvic floor PT - For treatment of pelvic organ prolapse, we discussed options for management including expectant management, conservative management, and surgical management, such as Kegels, a pessary, pelvic floor physical therapy, and specific surgical procedures. - start vaginal estrogen and encouraged Kegel exercises

## 2024-02-16 NOTE — Assessment & Plan Note (Signed)
-   For symptomatic vaginal atrophy options include lubrication with a water-based lubricant, personal hygiene measures and barrier protection against wetness, and estrogen replacement in the form of vaginal cream, vaginal tablets, or a time-released vaginal ring.   - Rx vaginal estrogen

## 2024-02-16 NOTE — Progress Notes (Signed)
 New Patient Evaluation and Consultation  Referring Provider: Silverio Lay, MD PCP: Jeoffrey Massed, MD Date of Service: 02/16/2024  SUBJECTIVE Chief Complaint: New Patient (Initial Visit) Barbara Munoz is a 73 y.o. female ere today for stress incontinence )  History of Present Illness: Barbara Munoz is a 73 y.o. White or Caucasian female seen in consultation at the request of Dr Estanislado Pandy for evaluation of urinary incontinence.     Urinary leakage started 8-10 years ago, worsened in the past year Husband passed away last Sep 22, 2024, diagnosed prostate cancer in 2004, with subsequent health problems in 2011 and 2020. Tearful while describing exposure to medical procedures, learned to flush catheters, repeated ED visit, and described as trauma. Reports good support with siblings and daughters. Pt does not recall urinary symptoms prior to husband's illness.  Denies new medications or surgeries Reports anxiety about urination since childhood, voids multiple times before leaving her home without leakage. Denies early childhood trauma, reports needing to follow rules and was told by her daughters that she is particular Tried self directed pelvic floor exercises, breakthrough pelvic floor PT without relief around 2-3 years ago Rectocele repair in 2012 by Dr. Ambrose Mantle with bulge inside vagina and splints intermittently for bowel movements. Denies recurrence of symptoms  Review of records significant for: History of C. Diff, migraine, thyroid dysfunction, vertigo  Urinary Symptoms: Leaks urine with cough/ sneeze, laughing, exercise, lifting, going from sitting to standing, with a full bladder, with movement to the bathroom, and with urgency  Leaks 3 time(s) per days with urgency, larger volume leakage, more bothersome Leaks 4/week with activity Postural leakage 3-4x/day with urgency and reports decreased sensation of urgency until she stands after prolonged sitting Pad use:  1-3  pads per day.    Patient is bothered by UI symptoms.  Day time voids 8-10 since childhood.  Nocturia: 1-2 times per night to void with insomnia Drinks until bedtime Reports snoring, denies OSA diagnosis Voiding dysfunction:  does not empty bladder well.  Patient does not use a catheter to empty bladder.  When urinating, patient feels a weak stream, dribbling after finishing, and the need to urinate multiple times in a row Drinks: 64-90oz water per day due to orthostatic hypotension, 10oz diet pepsi, 8-16oz decaf tea and coffee  UTIs:  0  UTI's in the last year.   Denies history of blood in urine, kidney or bladder stones, pyelonephritis, bladder cancer, and kidney cancer No results found for the last 90 days.   Pelvic Organ Prolapse Symptoms:                  Patient Denies a feeling of a bulge the vaginal area.   Bowel Symptom: Bowel movements: 3 time(s) per week with diverticulosis Stool consistency: soft  Straining: no.  Splinting: yes.  Incomplete evacuation: yes.  Patient Admits to accidental bowel leakage / fecal incontinence started 1 year ago  Occurs: 1 time(s) per 3-6 week  Consistency with leakage: liquid, triggered by restaurant food Bowel regimen: diet Last colonoscopy: Results polypectomy and diverticula noted [facility] Colonoscopy          Upcoming     Colonoscopy (Every 3 Years) Next due on 01/02/2026    01/02/2023  COLONOSCOPY   Only the first 1 history entries have been loaded, but more history exists.                Sexual Function Sexually active: no.  Sexual orientation: Straight Pain with sex: has discomfort due  to dryness  Pelvic Pain Denies pelvic pain   Past Medical History:  Past Medical History:  Diagnosis Date   Anxiety and depression 09/08/2011   Diverticulosis    12/2022 colonoscopy   Female bladder prolapse    GERD (gastroesophageal reflux disease) 09/05/2011   Improved signif with PPI   History of adenomatous polyp of colon 08/2017   History  of Clostridium difficile infection 2007   colonic changes c/w with this dx seen on colonoscopy 2007---sx's responded to flagyl.   History of cyst of breast    multiple drained during menopause   Hyperlipidemia    Hypotension 2022   ?idiopathic   IFG (impaired fasting glucose) 06/2018   Fastings 105-115 range when pt checked with husband's glucometer.  Fasting gluc here was 98 and A1c 5.6% 07/01/18.   Insomnia 09/05/2011   Migraine    worst during menopause   Orthostatic hypotension 11/20/2021   Overweight (BMI 25.0-29.9) 09/05/2011   Palpitations    rare PACs and PVCs on monitoring 12/2023   Seasonal allergic rhinitis    Subclinical hypothyroidism    Vertigo, benign positional 09/08/2011     Past Surgical History:   Past Surgical History:  Procedure Laterality Date   CARDIOVASCULAR STRESS TEST  02/15/2015   Exercise myocard perfusion testing: Normal EF, normal wall motion, normal perfusion.  Poor exercise capacity (4 min).   COLONOSCOPY  07/2006; 08/25/17   2007: pseudomembranous colitis, o/w normal. (Dr. Christella Hartigan).  08/2017: adenomas.  12/2022 adenomas.   Coronary calcium score     2024 ZERO   COSMETIC SURGERY     DEXA  06/19/09; 01/19/2019   Normal 2010, 2020, 2024   ESOPHAGOGASTRODUODENOSCOPY     01/02/23, normal   POSTERIOR REPAIR  2012   REFRACTIVE SURGERY  05/2022   TONSILLECTOMY  1982   TRANSTHORACIC ECHOCARDIOGRAM  01/06/2022   NORMAL   Transvaginal ultrasound  10/2016   Normal (done for LLQ pain by Dr. Pamalee Leyden).   TUBAL LIGATION  1983   vaginal hernia repair  05/2011   rectocele repair   ZIO monitoring  12/2023   rare PACs and PVCs     Past OB/GYN History: OB History  Gravida Para Term Preterm AB Living  3 2 1 1 1 2   SAB IAB Ectopic Multiple Live Births  1    2    # Outcome Date GA Lbr Len/2nd Weight Sex Type Anes PTL Lv  3 Term     F Vag-Spont   LIV  2 SAB           1 Preterm     F Vag-Forceps   LIV    Vaginal deliveries: 2, largest infant 6lb 8oz.  Forceps/ Vacuum deliveries: 1, Cesarean section: 0 Menopausal: Yes, at age 35 and lasted around 7 years, Denies vaginal bleeding since menopause Contraception: BTL and s/p menopause. Last pap smear was 07/20/23 per pt, 01/15/18 NILM, neg HPV.  Any history of abnormal pap smears: no. No results found for: "DIAGPAP", "HPVHIGH", "ADEQPAP"  Medications: Patient has a current medication list which includes the following prescription(s): alprazolam, atorvastatin, [START ON 02/18/2024] estradiol, fludrocortisone, fluticasone, loratadine, melatonin, meloxicam, midodrine, pantoprazole, sertraline, valacyclovir, gemtesa, and gemtesa.   Allergies: Patient has no known allergies.   Social History:  Social History   Tobacco Use   Smoking status: Never   Smokeless tobacco: Never   Tobacco comments:    smoked for about 6 months  Vaping Use   Vaping status: Never Used  Substance Use  Topics   Alcohol use: Yes    Comment: moderate use   Drug use: No    Relationship status: widowed Patient lives alone.   Patient is not employed. Regular exercise: No History of abuse: No  Family History:   Family History  Problem Relation Age of Onset   Diabetes Mother 8       type 2   Atrial fibrillation Mother    Aneurysm Father        aortic   Cancer Father        prostate and lung/ smoker   Pernicious anemia Father    Atrial fibrillation Sister    Hyperlipidemia Sister    Hypertension Sister    Diabetes Sister        type 2   Other Sister        degenerative disc disease   Thyroid disease Sister    Cancer Sister        breast  cancer s/p dbl mastectomy doing well   Hypertension Sister    Hyperlipidemia Sister    Diabetes Sister        type 2   Breast cancer Sister 47   Hyperlipidemia Brother    Allergies Brother    Breast cancer Maternal Grandmother 72   Heart attack Maternal Grandfather    Heart disease Paternal Grandmother        CHF   Pernicious anemia Paternal Grandmother    Kidney  disease Paternal Grandfather    Stroke Paternal Grandfather    Diabetes Daughter        type 1   Allergies Daughter    Colon cancer Neg Hx    Esophageal cancer Neg Hx    Stomach cancer Neg Hx    Rectal cancer Neg Hx    Bladder Cancer Neg Hx    Uterine cancer Neg Hx      Review of Systems: Review of Systems  Constitutional:  Positive for malaise/fatigue. Negative for fever and weight loss.  Respiratory:  Negative for cough, shortness of breath and wheezing.   Cardiovascular:  Positive for palpitations. Negative for chest pain and leg swelling.  Gastrointestinal:  Negative for abdominal pain and blood in stool.       Leakage  Genitourinary:  Positive for frequency and urgency. Negative for dysuria and hematuria.       Leakage  Skin:  Negative for rash.  Neurological:  Positive for dizziness and headaches. Negative for weakness.  Endo/Heme/Allergies:  Does not bruise/bleed easily.  Psychiatric/Behavioral:  Negative for depression. The patient is not nervous/anxious.      OBJECTIVE Physical Exam: Vitals:   02/16/24 0953  BP: 134/75  Pulse: 83  Weight: 151 lb 12.8 oz (68.9 kg)  Height: 5' 2.99" (1.6 m)    Physical Exam Constitutional:      General: She is not in acute distress.    Appearance: Normal appearance.  Genitourinary:     Bladder and urethral meatus normal.     No lesions in the vagina.     Right Labia: No rash, tenderness, lesions, skin changes or Bartholin's cyst.    Left Labia: No tenderness, lesions, skin changes, Bartholin's cyst or rash.    No vaginal discharge, erythema, tenderness, bleeding, ulceration or granulation tissue.     Anterior vaginal prolapse present.    Severe vaginal atrophy present.    Vaginal exam comments: Scarring band in the posterior vaginal wall, palpation recreates sensation of bladder urgency and pressure.  Right Adnexa: not tender, not full and no mass present.    Left Adnexa: not tender, not full and no mass present.     No cervical motion tenderness, discharge, friability, lesion, polyp or nabothian cyst.     Uterus is not enlarged, fixed, tender, irregular or prolapsed.     No uterine mass detected.    Urethral meatus caruncle not present.    No urethral prolapse, tenderness, mass, hypermobility, discharge or stress urinary incontinence with cough stress test present.     Bladder is not tender, urgency on palpation not present and masses not present.      Pelvic Floor: Levator muscle strength is 3/5.    Levator ani not tender, obturator internus not tender, no asymmetrical contractions present and no pelvic spasms present.    Symmetrical pelvic sensation, anal wink present and BC reflex present. Cardiovascular:     Rate and Rhythm: Normal rate.  Pulmonary:     Effort: Pulmonary effort is normal. No respiratory distress.  Abdominal:     General: There is no distension.     Palpations: Abdomen is soft. There is no mass.     Tenderness: There is no abdominal tenderness.     Hernia: No hernia is present.    Neurological:     Mental Status: She is alert.  Vitals reviewed. Exam conducted with a chaperone present.      POP-Q:   POP-Q  -1                                            Aa   -1                                           Ba  -5                                              C   2                                            Gh  3                                            Pb  5                                            tvl   -2                                            Ap  -2  Bp  -5                                              D    Post-Void Residual (PVR) by Bladder Scan: In order to evaluate bladder emptying, we discussed obtaining a postvoid residual and patient agreed to this procedure.  Procedure: The ultrasound unit was placed on the patient's abdomen in the suprapubic region after the patient had voided.    Post Void Residual  - 02/16/24 1001       Post Void Residual   Post Void Residual 12 mL              Laboratory Results: Lab Results  Component Value Date   COLORU Yellow 02/16/2024   CLARITYU Clear 02/16/2024   GLUCOSEUR Negative 02/16/2024   BILIRUBINUR Negative 02/16/2024   KETONESU Negative 02/16/2024   SPECGRAV 1.015 02/16/2024   RBCUR Negative 02/16/2024   PHUR 5.5 02/16/2024   PROTEINUR Negative 02/16/2024   UROBILINOGEN 0.2 02/16/2024   LEUKOCYTESUR Negative 02/16/2024    Lab Results  Component Value Date   CREATININE 0.63 08/31/2023   CREATININE 0.67 02/26/2023   CREATININE 0.72 11/06/2022    Lab Results  Component Value Date   HGBA1C 5.4 11/08/2019    Lab Results  Component Value Date   HGB 13.6 02/26/2023     ASSESSMENT AND PLAN Ms. Sohm is a 73 y.o. with:  1. Urinary incontinence, mixed   2. Vaginal atrophy   3. History of pelvic surgery   4. Feeling of incomplete bladder emptying   5. Nocturia   6. Incontinence of feces, unspecified fecal incontinence type   7. Pelvic organ prolapse quantification stage 2 cystocele     Urinary incontinence, mixed Assessment & Plan: - POCT UA negative, PVR WNL - started since husband got sick and menopause - We discussed the symptoms of overactive bladder (OAB), which include urinary urgency, urinary frequency, nocturia, with or without urge incontinence.  While we do not know the exact etiology of OAB, several treatment options exist. We discussed management including behavioral therapy (decreasing bladder irritants, urge suppression strategies, timed voids, bladder retraining), physical therapy, medication; for refractory cases posterior tibial nerve stimulation, sacral neuromodulation, and intravesical botulinum toxin injection.  For anticholinergic medications, we discussed the potential side effects of anticholinergics including dry eyes, dry mouth, constipation, cognitive impairment and urinary retention. For Beta-3  agonist medication, we discussed the potential side effect of elevated blood pressure which is more likely to occur in individuals with uncontrolled hypertension. - For treatment of stress urinary incontinence,  non-surgical options include expectant management, weight loss, physical therapy, as well as a pessary.  Surgical options include a midurethral sling, Burch urethropexy, and transurethral injection of a bulking agent. - start vaginal estrogen - encouraged fluid management, avoid Kegels and caffeine reduction, optimize bowel consistency - trial of gemtesa if refractory symptoms with samples and Rx provided  Orders: -     POCT urinalysis dipstick -     Estradiol; Place 0.5-1g nightly for two weeks then twice a week after  Dispense: 30 g; Refill: 3  Vaginal atrophy Assessment & Plan: - For symptomatic vaginal atrophy options include lubrication with a water-based lubricant, personal hygiene measures and barrier protection against wetness, and estrogen replacement in the form of vaginal cream, vaginal tablets, or a time-released vaginal ring.   - Rx  vaginal estrogen   History of pelvic surgery Assessment & Plan: - history of rectocele repair in 2012, tight band of scar noted at posterior vaginal wall. Palpation of posterior vaginal wall recreates sensation of bladder urgency - reports constipation since, encouraged fiber supplementation to optimize stool consistency and avoid straining - encouraged to consider pelvic floor PT if refractory symptoms  Orders: -     Estradiol; Place 0.5-1g nightly for two weeks then twice a week after  Dispense: 30 g; Refill: 3  Feeling of incomplete bladder emptying Assessment & Plan: - POCT UA negative, bladder scan 12mL - repeat if clinical change  Orders: -     Estradiol; Place 0.5-1g nightly for two weeks then twice a week after  Dispense: 30 g; Refill: 3  Nocturia Assessment & Plan: - avoid fluid intake 3hrs before bedtime - elevated feet  during the day or use compression socks to reduce lower extremity swelling - reports snoring, consider workup for sleep apnea - trial of Gemtesa   Incontinence of feces, unspecified fecal incontinence type Assessment & Plan: - dietary triggers after eating at restaurants - Treatment options include anti-diarrhea medication (loperamide/ Imodium OTC or prescription lomotil), fiber supplements, physical therapy, and possible sacral neuromodulation or surgery.   - trial of IBGuard   Pelvic organ prolapse quantification stage 2 cystocele Assessment & Plan: - asymptomatic stage II POP - s/p rectocele repair, s/p pelvic floor PT - For treatment of pelvic organ prolapse, we discussed options for management including expectant management, conservative management, and surgical management, such as Kegels, a pessary, pelvic floor physical therapy, and specific surgical procedures. - start vaginal estrogen and encouraged Kegel exercises   Other orders -     Gemtesa; Take 1 tablet (75 mg total) by mouth daily.  Dispense: 28 tablet -     Gemtesa; Take 1 tablet (75 mg total) by mouth daily.  Dispense: 30 tablet; Refill: 2  Time spent: I spent 74 minutes dedicated to the care of this patient on the date of this encounter to include pre-visit review of records, face-to-face time with the patient discussing mixed urinary incontinence, vaginal atrophy, stage II pelvic organ prolapse with history of rectocele repair, fecal incontinence, nocturia, sensation of incomplete bladder emptying, and post visit documentation and ordering medication/ testing.     Loleta Chance, MD

## 2024-02-16 NOTE — Assessment & Plan Note (Addendum)
-   POCT UA negative, PVR WNL - started since husband got sick and menopause - We discussed the symptoms of overactive bladder (OAB), which include urinary urgency, urinary frequency, nocturia, with or without urge incontinence.  While we do not know the exact etiology of OAB, several treatment options exist. We discussed management including behavioral therapy (decreasing bladder irritants, urge suppression strategies, timed voids, bladder retraining), physical therapy, medication; for refractory cases posterior tibial nerve stimulation, sacral neuromodulation, and intravesical botulinum toxin injection.  For anticholinergic medications, we discussed the potential side effects of anticholinergics including dry eyes, dry mouth, constipation, cognitive impairment and urinary retention. For Beta-3 agonist medication, we discussed the potential side effect of elevated blood pressure which is more likely to occur in individuals with uncontrolled hypertension. - For treatment of stress urinary incontinence,  non-surgical options include expectant management, weight loss, physical therapy, as well as a pessary.  Surgical options include a midurethral sling, Burch urethropexy, and transurethral injection of a bulking agent. - start vaginal estrogen - encouraged fluid management, avoid Kegels and caffeine reduction, optimize bowel consistency - trial of gemtesa if refractory symptoms with samples and Rx provided

## 2024-02-16 NOTE — Assessment & Plan Note (Signed)
-   avoid fluid intake 3hrs before bedtime - elevated feet during the day or use compression socks to reduce lower extremity swelling - reports snoring, consider workup for sleep apnea - trial of Gemtesa

## 2024-02-16 NOTE — Assessment & Plan Note (Signed)
-   dietary triggers after eating at restaurants - Treatment options include anti-diarrhea medication (loperamide/ Imodium OTC or prescription lomotil), fiber supplements, physical therapy, and possible sacral neuromodulation or surgery.   - trial of IBGuard

## 2024-02-16 NOTE — Patient Instructions (Addendum)
 Mixed Incontinence (MUI):  MUI includes symptoms of both Overactive bladder (OAB) and stress incontinence (SUI).    Overactive bladder (OAB) causes bladder urgency, frequency, and having to void at night with or without leakage.  Several  treatment options exist, including behavioral changes (avoiding caffeine, etc), physical therapy, medications, and neuromodulation (ways to change the nerve signals to the bladder).   We discussed the symptoms of overactive bladder (OAB), which include urinary urgency, urinary frequency, night-time urination, with or without urge incontinence.  We discussed management including behavioral therapy (decreasing bladder irritants by following a bladder diet, urge suppression strategies, timed voids, bladder retraining), physical therapy, medication; and for refractory cases posterior tibial nerve stimulation, sacral neuromodulation, and intravesical botulinum toxin injection.   For Beta-3 agonist medication, we discussed the potential side effect of elevated blood pressure which is more likely to occur in individuals with uncontrolled hypertension. You were given samples for Gemtesa 75 mg.  It can take a month to start working so give it time, but if you have bothersome side effects call sooner and we can try a different medication.  Call us if you have trouble filling the prescription or if it's not covered by your insurance.  Stress incontinence (SUI) causes urinary leakage with coughing, laughing, sneezing and occasionally during exercise or bending/lifting.  Treatment options include nonsurgical options such as Kegel (pelvic floor) exercises, physical therapy, pessary (vaginal device similar to a diaphragm to prevent leakage) or surgical options such as a midurethral sling.   For night time frequency: - avoid fluid intake 3hrs before bedtime - elevated your feet during the day or use compression socks to reduce lower extremity swelling - due to snoring, consider  workup for sleep apnea  Constipation: Our goal is to achieve formed bowel movements daily or every-other-day.  You may need to try different combinations of the following options to find what works best for you - everybody's body works differently so feel free to adjust the dosages as needed.  Some options to help maintain bowel health include:  Dietary changes (more leafy greens, vegetables and fruits; less processed foods) Fiber supplementation (Benefiber, FiberCon, Metamucil or Psyllium). Start slow and increase gradually to full dose. Over-the-counter agents such as: stool softeners (Docusate or Colace) and/or laxatives (Miralax, milk of magnesia)  "Power Pudding" is a natural mixture that may help your constipation.  To make blend 1 cup applesauce, 1 cup wheat bran, and 3/4 cup prune juice, refrigerate and then take 1 tablespoon daily with a large glass of water as needed.   Accidental Bowel Leakage:  - Treatment options include anti-diarrhea medication (loperamide/ Imodium OTC or prescription lomotil), fiber supplements, physical therapy, and possible sacral neuromodulation or surgery.    - try IBGuard

## 2024-02-24 ENCOUNTER — Other Ambulatory Visit: Payer: Self-pay | Admitting: Family Medicine

## 2024-02-26 NOTE — Patient Instructions (Signed)

## 2024-02-29 ENCOUNTER — Encounter (INDEPENDENT_AMBULATORY_CARE_PROVIDER_SITE_OTHER): Payer: Self-pay

## 2024-02-29 ENCOUNTER — Ambulatory Visit: Payer: Medicare PPO | Admitting: Family Medicine

## 2024-02-29 ENCOUNTER — Encounter: Payer: Self-pay | Admitting: Family Medicine

## 2024-02-29 VITALS — BP 131/73 | HR 67 | Ht 64.0 in | Wt 149.4 lb

## 2024-02-29 DIAGNOSIS — F411 Generalized anxiety disorder: Secondary | ICD-10-CM | POA: Diagnosis not present

## 2024-02-29 DIAGNOSIS — E7800 Pure hypercholesterolemia, unspecified: Secondary | ICD-10-CM | POA: Diagnosis not present

## 2024-02-29 DIAGNOSIS — F3340 Major depressive disorder, recurrent, in remission, unspecified: Secondary | ICD-10-CM

## 2024-02-29 DIAGNOSIS — Z0000 Encounter for general adult medical examination without abnormal findings: Secondary | ICD-10-CM

## 2024-02-29 DIAGNOSIS — Z79899 Other long term (current) drug therapy: Secondary | ICD-10-CM

## 2024-02-29 LAB — COMPREHENSIVE METABOLIC PANEL
ALT: 26 U/L (ref 0–35)
AST: 27 U/L (ref 0–37)
Albumin: 4.5 g/dL (ref 3.5–5.2)
Alkaline Phosphatase: 61 U/L (ref 39–117)
BUN: 13 mg/dL (ref 6–23)
CO2: 31 meq/L (ref 19–32)
Calcium: 9.5 mg/dL (ref 8.4–10.5)
Chloride: 103 meq/L (ref 96–112)
Creatinine, Ser: 0.62 mg/dL (ref 0.40–1.20)
GFR: 89.05 mL/min (ref >60.00)
Glucose, Bld: 91 mg/dL (ref 70–99)
Potassium: 4.2 meq/L (ref 3.5–5.1)
Sodium: 142 meq/L (ref 135–145)
Total Bilirubin: 0.6 mg/dL (ref 0.2–1.2)
Total Protein: 7.1 g/dL (ref 6.0–8.3)

## 2024-02-29 LAB — CBC WITH DIFFERENTIAL/PLATELET
Basophils Absolute: 0 10*3/uL (ref 0.0–0.1)
Basophils Relative: 0.7 % (ref 0.0–3.0)
Eosinophils Absolute: 0.1 10*3/uL (ref 0.0–0.7)
Eosinophils Relative: 2 % (ref 0.0–5.0)
HCT: 43.1 % (ref 36.0–46.0)
Hemoglobin: 14.4 g/dL (ref 12.0–15.0)
Lymphocytes Relative: 37 % (ref 12.0–46.0)
Lymphs Abs: 2.2 10*3/uL (ref 0.7–4.0)
MCHC: 33.3 g/dL (ref 30.0–36.0)
MCV: 93.5 fl (ref 78.0–100.0)
Monocytes Absolute: 0.5 10*3/uL (ref 0.1–1.0)
Monocytes Relative: 8.7 % (ref 3.0–12.0)
Neutro Abs: 3 10*3/uL (ref 1.4–7.7)
Neutrophils Relative %: 51.6 % (ref 43.0–77.0)
Platelets: 155 10*3/uL (ref 150.0–400.0)
RBC: 4.61 Mil/uL (ref 3.87–5.11)
RDW: 14.1 % (ref 11.5–15.5)
WBC: 5.9 10*3/uL (ref 4.0–10.5)

## 2024-02-29 LAB — LIPID PANEL
Cholesterol: 177 mg/dL (ref 0–200)
HDL: 47.6 mg/dL (ref >39.00)
LDL Cholesterol: 101 mg/dL — ABNORMAL HIGH (ref 0–99)
NonHDL: 129.02
Total CHOL/HDL Ratio: 4
Triglycerides: 141 mg/dL (ref 0.0–149.0)
VLDL: 28.2 mg/dL (ref 0.0–40.0)

## 2024-02-29 MED ORDER — SERTRALINE HCL 100 MG PO TABS: 200.0000 mg | ORAL_TABLET | Freq: Every day | ORAL | 3 refills | Status: AC

## 2024-02-29 MED ORDER — PANTOPRAZOLE SODIUM 40 MG PO TBEC: 40.0000 mg | DELAYED_RELEASE_TABLET | Freq: Every day | ORAL | 3 refills | Status: AC | PRN

## 2024-02-29 MED ORDER — ALPRAZOLAM 0.25 MG PO TABS: 0.2500 mg | ORAL_TABLET | Freq: Two times a day (BID) | ORAL | 3 refills | Status: DC | PRN

## 2024-02-29 MED ORDER — ATORVASTATIN CALCIUM 20 MG PO TABS: 20.0000 mg | ORAL_TABLET | Freq: Every day | ORAL | 3 refills | Status: AC

## 2024-02-29 NOTE — Progress Notes (Signed)
 Office Note 02/29/2024  CC:  Chief Complaint  Patient presents with   Annual Exam    Pt is fasting   Patient is a 73 y.o. female who is here for annual health maintenance exam and f/u anxiety and idiopathic hypotension. A/P as of last visit: "#1 idiopathic hypotension. Stable on Florinef  0.1 mg and midodrine  5 mg 3 times a day. Basic metabolic panel today.   2.  Hypercholesterolemia. Zocor  40 mg a day. Lipid panel today and if LDL not less than 100 we will switch her over to a more potent statin such as atorvastatin  or rosuvastatin.   3.  GAD and recurrent major depressive disorder. Overall pretty stable. Her husband's chronic illnesses and progressive dementia played a role.  Continue sertraline  200 mg a day and Xanax  0.25 mg, 1 tab twice daily as needed.   #4 skin cancer screening. Patient's dermatologist is relocated so she requested new dermatology referral today."  INTERIM HX: Doing well. Switched to a atorvastatin  after last visit.  No problems.   PMP AWARE reviewed today: most recent rx for alprazolam  was filled 02/15/2024, # 60, rx by me. No red flags.  Past Medical History:  Diagnosis Date   Anxiety and depression 09/08/2011   Diverticulosis    12/2022 colonoscopy   Female bladder prolapse    GERD (gastroesophageal reflux disease) 09/05/2011   Improved signif with PPI   History of adenomatous polyp of colon 08/2017   History of Clostridium difficile infection 2007   colonic changes c/w with this dx seen on colonoscopy 2007---sx's responded to flagyl .   History of cyst of breast    multiple drained during menopause   Hyperlipidemia    Hypotension 2022   ?idiopathic   IFG (impaired fasting glucose) 06/2018   Fastings 105-115 range when pt checked with husband's glucometer.  Fasting gluc here was 98 and A1c 5.6% 07/01/18.   Insomnia 09/05/2011   Migraine    worst during menopause   Orthostatic hypotension 11/20/2021   Overweight (BMI 25.0-29.9)  09/05/2011   Palpitations    rare PACs and PVCs on monitoring 12/2023   Seasonal allergic rhinitis    Subclinical hypothyroidism    Vertigo, benign positional 09/08/2011    Past Surgical History:  Procedure Laterality Date   CARDIOVASCULAR STRESS TEST  02/15/2015   Exercise myocard perfusion testing: Normal EF, normal wall motion, normal perfusion.  Poor exercise capacity (4 min).   COLONOSCOPY  07/2006; 08/25/17   2007: pseudomembranous colitis, o/w normal. (Dr. Howard Macho).  08/2017: adenomas.  12/2022 adenomas.   Coronary calcium  score     2024 ZERO   COSMETIC SURGERY     DEXA  06/19/09; 01/19/2019   Normal 2010, 2020, 2024   ESOPHAGOGASTRODUODENOSCOPY     01/02/23, normal   POSTERIOR REPAIR  2012   REFRACTIVE SURGERY  05/2022   TONSILLECTOMY  1982   TRANSTHORACIC ECHOCARDIOGRAM  01/06/2022   NORMAL   Transvaginal ultrasound  10/2016   Normal (done for LLQ pain by Dr. Bartholome Boron).   TUBAL LIGATION  1983   vaginal hernia repair  05/2011   rectocele repair   ZIO monitoring  12/2023   rare PACs and PVCs    Family History  Problem Relation Age of Onset   Diabetes Mother 41       type 2   Atrial fibrillation Mother    Aneurysm Father        aortic   Cancer Father        prostate and lung/  smoker   Pernicious anemia Father    Atrial fibrillation Sister    Hyperlipidemia Sister    Hypertension Sister    Diabetes Sister        type 2   Other Sister        degenerative disc disease   Thyroid  disease Sister    Cancer Sister        breast  cancer s/p dbl mastectomy doing well   Hypertension Sister    Hyperlipidemia Sister    Diabetes Sister        type 2   Breast cancer Sister 3   Hyperlipidemia Brother    Allergies Brother    Breast cancer Maternal Grandmother 72   Heart attack Maternal Grandfather    Heart disease Paternal Grandmother        CHF   Pernicious anemia Paternal Grandmother    Kidney disease Paternal Grandfather    Stroke Paternal Grandfather     Diabetes Daughter        type 1   Allergies Daughter    Colon cancer Neg Hx    Esophageal cancer Neg Hx    Stomach cancer Neg Hx    Rectal cancer Neg Hx    Bladder Cancer Neg Hx    Uterine cancer Neg Hx     Social History   Socioeconomic History   Marital status: Married    Spouse name: Porfirio Bristol   Number of children: 2   Years of education: Not on file   Highest education level: Master's degree (e.g., MA, MS, MEng, MEd, MSW, MBA)  Occupational History   Occupation: retired  Tobacco Use   Smoking status: Never   Smokeless tobacco: Never   Tobacco comments:    smoked for about 6 months  Vaping Use   Vaping status: Never Used  Substance and Sexual Activity   Alcohol use: Yes    Comment: moderate use   Drug use: No   Sexual activity: Yes    Birth control/protection: Post-menopausal  Other Topics Concern   Not on file  Social History Narrative   Widow (08/2023)   Nonsmoker.   2 daughters live within an hour   Social Drivers of Health   Financial Resource Strain: Low Risk  (12/14/2023)   Overall Financial Resource Strain (CARDIA)    Difficulty of Paying Living Expenses: Not hard at all  Food Insecurity: No Food Insecurity (12/14/2023)   Hunger Vital Sign    Worried About Running Out of Food in the Last Year: Never true    Ran Out of Food in the Last Year: Never true  Transportation Needs: No Transportation Needs (12/14/2023)   PRAPARE - Administrator, Civil Service (Medical): No    Lack of Transportation (Non-Medical): No  Physical Activity: Insufficiently Active (12/14/2023)   Exercise Vital Sign    Days of Exercise per Week: 3 days    Minutes of Exercise per Session: 20 min  Stress: Stress Concern Present (12/14/2023)   [employer] of Occupational Health - Occupational Stress Questionnaire    Feeling of Stress : To some extent  Social Connections: Moderately Integrated (12/14/2023)   Social Connection and Isolation Panel [NHANES]    Frequency  of Communication with Friends and Family: More than three times a week    Frequency of Social Gatherings with Friends and Family: Once a week    Attends Religious Services: More than 4 times per year    Active Member of [employer] or Organizations: Yes  Attends Banker Meetings: More than 4 times per year    Marital Status: Widowed  Intimate Partner Violence: Not At Risk (06/24/2023)   Humiliation, Afraid, Rape, and Kick questionnaire    Fear of Current or Ex-Partner: No    Emotionally Abused: No    Physically Abused: No    Sexually Abused: No    Outpatient Medications Prior to Visit  Medication Sig Dispense Refill   ALPRAZolam  (XANAX ) 0.25 MG tablet TAKE 1 TABLET BY MOUTH TWICE A DAY AS NEEDED 60 tablet 3   atorvastatin  (LIPITOR) 20 MG tablet TAKE 1 TABLET BY MOUTH EVERY DAY 90 tablet 1   estradiol  (ESTRACE ) 0.1 MG/GM vaginal cream Place 0.5-1g nightly for two weeks then twice a week after 30 g 3   fludrocortisone  (FLORINEF ) 0.1 MG tablet TAKE 1 TABLET BY MOUTH EVERY DAY 90 tablet 3   fluticasone  (FLONASE ) 50 MCG/ACT nasal spray SPRAY 2 SPRAYS INTO EACH NOSTRIL EVERY DAY (Patient taking differently: Place 2 sprays into both nostrils daily as needed for allergies or rhinitis.) 16 g 2   loratadine (CLARITIN) 10 MG tablet Take 10 mg by mouth daily as needed. Spring and fall     Melatonin 5 MG CAPS Take 5 mg by mouth as needed.     meloxicam  (MOBIC ) 7.5 MG tablet TAKE 1 TO 2 TABLETS EVERY DAY AS NEEDED FOR MUSCULOSKELETAL PAIN 60 tablet 2   midodrine  (PROAMATINE ) 5 MG tablet TAKE 1 TABLET (5 MG TOTAL) BY MOUTH 3 (THREE) TIMES DAILY WITH MEALS. 270 tablet 1   pantoprazole  (PROTONIX ) 40 MG tablet TAKE 1 TABLET BY MOUTH DAILY AS NEEDED 90 tablet 3   sertraline  (ZOLOFT ) 100 MG tablet TAKE 2 TABLETS BY MOUTH AT BEDTIME 60 tablet 0   valACYclovir  (VALTREX ) 1000 MG tablet Take 1 tablet (1,000 mg total) by mouth daily. (Patient taking differently: Take 1,000 mg by mouth daily as needed (fever  blisters).) 10 tablet 1   Vibegron  (GEMTESA ) 75 MG TABS Take 1 tablet (75 mg total) by mouth daily. 28 tablet    Vibegron  (GEMTESA ) 75 MG TABS Take 1 tablet (75 mg total) by mouth daily. 30 tablet 2   No facility-administered medications prior to visit.    No Known Allergies  Review of Systems  Constitutional:  Negative for appetite change, chills, fatigue and fever.  HENT:  Negative for congestion, dental problem, ear pain and sore throat.   Eyes:  Negative for discharge, redness and visual disturbance.  Respiratory:  Negative for cough, chest tightness, shortness of breath and wheezing.   Cardiovascular:  Negative for chest pain, palpitations and leg swelling.  Gastrointestinal:  Negative for abdominal pain, blood in stool, diarrhea, nausea and vomiting.  Genitourinary:  Negative for difficulty urinating, dysuria, flank pain, frequency, hematuria and urgency.  Musculoskeletal:  Negative for arthralgias, back pain, joint swelling, myalgias and neck stiffness.  Skin:  Negative for pallor and rash.  Neurological:  Negative for dizziness, speech difficulty, weakness and headaches.  Hematological:  Negative for adenopathy. Does not bruise/bleed easily.  Psychiatric/Behavioral:  Negative for confusion and sleep disturbance. The patient is not nervous/anxious.     PE;    02/29/2024   10:05 AM 02/16/2024    9:53 AM 12/14/2023    3:57 PM  Vitals with BMI  Height 5\' 4"  5' 2.992"   Weight 149 lbs 6 oz 151 lbs 13 oz 150 lbs 3 oz  BMI 25.63 26.9 26.61  Systolic 131 134 355  Diastolic 73 75 79  Pulse 67 83 79   Exam chaperoned by Pandora Bogaert, CMA  Gen: Alert, well appearing.  Patient is oriented to person, place, time, and situation. AFFECT: pleasant, lucid thought and speech. ENT: Ears: EACs clear, normal epithelium.  TMs with good light reflex and landmarks bilaterally.  Eyes: no injection, icteris, swelling, or exudate.  EOMI, PERRLA. Nose: no drainage or turbinate edema/swelling.   No injection or focal lesion.  Mouth: lips without lesion/swelling.  Oral mucosa pink and moist.  Dentition intact and without obvious caries or gingival swelling.  Oropharynx without erythema, exudate, or swelling.  Neck: supple/nontender.  No LAD, mass, or TM.  Carotid pulses 2+ bilaterally, without bruits. CV: RRR, no m/r/g.   LUNGS: CTA bilat, nonlabored resps, good aeration in all lung fields. ABD: soft, NT, ND, BS normal.  No hepatospenomegaly or mass.  No bruits. EXT: no clubbing, cyanosis, or edema.  Musculoskeletal: no joint swelling, erythema, warmth, or tenderness.  ROM of all joints intact. Skin - no sores or suspicious lesions or rashes or color changes  Pertinent labs:  Lab Results  Component Value Date   TSH 2.96 02/26/2023   Lab Results  Component Value Date   WBC 6.2 02/26/2023   HGB 13.6 02/26/2023   HCT 40.7 02/26/2023   MCV 93.2 02/26/2023   PLT 174.0 02/26/2023   Lab Results  Component Value Date   CREATININE 0.63 08/31/2023   BUN 13 08/31/2023   NA 142 08/31/2023   K 4.2 08/31/2023   CL 103 08/31/2023   CO2 30 08/31/2023   Lab Results  Component Value Date   ALT 31 12/01/2023   AST 28 12/01/2023   ALKPHOS 54 02/26/2023   BILITOT 0.5 02/26/2023   Lab Results  Component Value Date   CHOL 186 12/01/2023   Lab Results  Component Value Date   HDL 46.90 12/01/2023   Lab Results  Component Value Date   LDLCALC 107 (H) 12/01/2023   Lab Results  Component Value Date   TRIG 164.0 (H) 12/01/2023   Lab Results  Component Value Date   CHOLHDL 4 12/01/2023   Lab Results  Component Value Date   HGBA1C 5.4 11/08/2019   ASSESSMENT AND PLAN:   1 health maintenance exam: Reviewed age and gender appropriate health maintenance issues (prudent diet, regular exercise, health risks of tobacco and excessive alcohol, use of seatbelts, fire alarms in home, use of sunscreen).  Also reviewed age and gender appropriate health screening as well as vaccine  recommendations. Vaccines: ALL UTD. Labs: CBC with differential, c-Met, lipid panel Cervical ca screening: per GYN MD. Breast ca screening: Mammogram - 12/2023.   Colon ca screening: 12/2022 adenomas-->recall 5 yrs. Osteoporosis screening: Bone density testing normal 03/05/2023. Repeat approximately March 2026.  Continue vitamin D and calcium  supplement.  2 Idiopathic hypotension. Stable on Florinef  0.1 mg and midodrine  5 mg 3 times a day. Basic metabolic panel today.   2.  Hypercholesterolemia. Doing well on a atorvastatin  20 mg a day. Lipid panel and hepatic panel today.   3.  GAD and recurrent major depressive disorder. Overall pretty stable.  Continue sertraline  200 mg a day and Xanax  0.25 mg, 1 tab twice daily as needed.  An After Visit Summary was printed and given to the patient.  FOLLOW UP: 6 months  Signed:  Arletha Lady, MD           02/29/2024

## 2024-03-01 ENCOUNTER — Encounter: Payer: Self-pay | Admitting: Family Medicine

## 2024-03-18 ENCOUNTER — Ambulatory Visit: Payer: Medicare PPO | Admitting: Family Medicine

## 2024-04-17 ENCOUNTER — Other Ambulatory Visit: Payer: Self-pay | Admitting: Family Medicine

## 2024-05-17 ENCOUNTER — Encounter: Payer: Self-pay | Admitting: Obstetrics

## 2024-05-17 ENCOUNTER — Ambulatory Visit: Payer: Medicare PPO | Admitting: Obstetrics

## 2024-05-17 VITALS — BP 135/79 | HR 77

## 2024-05-17 DIAGNOSIS — N3946 Mixed incontinence: Secondary | ICD-10-CM | POA: Diagnosis not present

## 2024-05-17 DIAGNOSIS — N952 Postmenopausal atrophic vaginitis: Secondary | ICD-10-CM

## 2024-05-17 DIAGNOSIS — N8110 Cystocele, unspecified: Secondary | ICD-10-CM

## 2024-05-17 DIAGNOSIS — R159 Full incontinence of feces: Secondary | ICD-10-CM

## 2024-05-17 DIAGNOSIS — N644 Mastodynia: Secondary | ICD-10-CM

## 2024-05-17 MED ORDER — MIRABEGRON ER 25 MG PO TB24: 25.0000 mg | ORAL_TABLET | Freq: Every day | ORAL | 0 refills | Status: DC

## 2024-05-17 NOTE — Progress Notes (Signed)
 [facility] Return Visit  SUBJECTIVE  History of Present Illness: Barbara Munoz is a 73 y.o. female seen in follow-up for mixed urinary incontinence, vaginal atrophy, history of pelvic surgery, feeling of incomplete bladder emptying, nocturia, fecal incontinence, stage II pelvic organ prolapse. Plan at last visit was trial of Gemtesa , vaginal estrogen, IBGuard, consider sleep study, consider pelvic floor PT.   Started estradiol  02/19/24 with left breast tenderness since prior to estrogen use with history of painful breast cysts, now using 1g1-2x/week. Desires to continue due to improvement in vaginal sensation. Reports initially started using miralax 02/27/24 with increased gas and "sticky" bowel movements with increased frequency. Discontinued miralax.  Started IBGuard with no change in symptoms and worsens heartburn. Started Loratadine with allergies.  Took imodium 04/16/24 Started psyllium fiber capsules 1 capsule every 2 days with improvement of bowel movement.  Fecal leakage resolved since starting fiber supplementation.  Fecal leakage episodes associated with urinary leakage. [facility]  Reports minimal nocturia symptoms, only when she has sleep quality Maintains alcohol intake to < 10% Pending to start therapy for anxiety  Up-to-date with breast screening 12/29/23 "CLINICAL DATA:  Screening.   EXAM: DIGITAL SCREENING BILATERAL MAMMOGRAM WITH TOMOSYNTHESIS AND CAD   TECHNIQUE: Bilateral screening digital craniocaudal and mediolateral oblique mammograms were obtained. Bilateral screening digital breast tomosynthesis was performed. The images were evaluated with computer-aided detection.   COMPARISON:  Previous exam(s).   ACR Breast Density Category c: The breasts are heterogeneously dense, which may obscure small masses.   FINDINGS: There are no findings suspicious for malignancy.   IMPRESSION: No mammographic evidence of malignancy.  A result letter of this screening mammogram will be mailed directly to the patient.   RECOMMENDATION: Screening mammogram in one year. (Code:[id number]-B-01Y)   BI-RADS CATEGORY  1: Negative.     Electronically Signed   By: Roda Cirri M.D.   On: 12/30/2023 12:03"  Past Medical History: Patient  has a past medical history of Anxiety and depression (09/08/2011), Diverticulosis, Female bladder prolapse, GERD (gastroesophageal reflux disease) (09/05/2011), History of adenomatous polyp of colon (08/2017), History of Clostridium difficile infection (2007), History of cyst of breast, Hyperlipidemia, Hypotension (2022), IFG (impaired fasting glucose) (06/2018), Insomnia (09/05/2011), Migraine, Orthostatic hypotension (11/20/2021), Overweight (BMI 25.0-29.9) (09/05/2011), Palpitations, Seasonal allergic rhinitis, Subclinical hypothyroidism, and Vertigo, benign positional (09/08/2011).   Past Surgical History: She  has a past surgical history that includes Tonsillectomy (1982); Tubal ligation (0454); vaginal hernia repair (05/2011); Cosmetic surgery; Posterior repair (2012); Colonoscopy (07/2006; 08/25/17); Cardiovascular stress test (02/15/2015); DEXA (06/19/09; 01/19/2019); Transvaginal ultrasound (10/2016); transthoracic echocardiogram (01/06/2022); Esophagogastroduodenoscopy; Refractive surgery (05/2022); Coronary calcium  score; and ZIO monitoring (12/2023).   Medications: She has a current medication list which includes the following prescription(s): alprazolam , atorvastatin , estradiol , fludrocortisone , fluticasone , loratadine, melatonin, meloxicam , midodrine , mirabegron er, pantoprazole , sertraline , and valacyclovir .   Allergies: Patient has no known allergies.   Social History: Patient  reports that she has never smoked. She has never used smokeless tobacco. She reports current alcohol use. She reports that she does not use drugs.     OBJECTIVE     Physical Exam: Vitals:   05/17/24 1036  05/17/24 1037  BP: (!) 143/77 135/79  Pulse: 75 77   Gen: No apparent distress, A&O x 3.  Detailed Urogynecologic Evaluation:  Deferred.    ASSESSMENT AND PLAN    [name] is a 73 y.o. with:  1. Incontinence of feces, unspecified fecal incontinence type   2. Vaginal atrophy  3. Urinary incontinence, mixed   4. Pelvic organ prolapse quantification stage 2 cystocele   5. Breast tenderness     Incontinence of feces, unspecified fecal incontinence type Assessment & Plan: - dietary triggers after eating at restaurants - Treatment options include anti-diarrhea medication (loperamide/ Imodium OTC or prescription lomotil), fiber supplements, physical therapy, and possible sacral neuromodulation or surgery.   - trial of IBGuard with worsening GERD symptoms - resolved since fiber supplementation, continue every 1-2 days - referral sent to pelvic floor PT  Orders: -     AMB referral to rehabilitation  Vaginal atrophy Assessment & Plan: - For symptomatic vaginal atrophy options include lubrication with a water-based lubricant, personal hygiene measures and barrier protection against wetness, and estrogen replacement in the form of vaginal cream, vaginal tablets, or a time-released vaginal ring.   - breast tenderness prior to starting vaginal estrogen, declines discontinuation due to relief - encouraged to reduce dose to 0.5g twice a week   Urinary incontinence, mixed Assessment & Plan: - 02/15/23 POCT UA negative, PVR WNL - started since husband got sick and menopause - We discussed the symptoms of overactive bladder (OAB), which include urinary urgency, urinary frequency, nocturia, with or without urge incontinence.  While we do not know the exact etiology of OAB, several treatment options exist. We discussed management including behavioral therapy (decreasing bladder irritants, urge suppression strategies, timed voids, bladder retraining), physical therapy, medication; for refractory  cases posterior tibial nerve stimulation, sacral neuromodulation, and intravesical botulinum toxin injection.  For anticholinergic medications, we discussed the potential side effects of anticholinergics including dry eyes, dry mouth, constipation, cognitive impairment and urinary retention. For Beta-3 agonist medication, we discussed the potential side effect of elevated blood pressure which is more likely to occur in individuals with uncontrolled hypertension. - For treatment of stress urinary incontinence,  non-surgical options include expectant management, weight loss, physical therapy, as well as a pessary.  Surgical options include a midurethral sling, Burch urethropexy, and transurethral injection of a bulking agent. - continue low dose vaginal estrogen 0.5g twice a week - encouraged fluid management, avoid Kegels and caffeine reduction to <10% if total intake, continue fiber supplementation to optimize bowel consistency - start trial of gemtesa  if refractory symptoms with samples and Rx previously provided - Rx mirabegron if cost prohibitive - referral for pelvic floor PT  Orders: -     AMB referral to rehabilitation -     Mirabegron ER; Take 1 tablet (25 mg total) by mouth daily.  Dispense: 30 tablet; Refill: 0  Pelvic organ prolapse quantification stage 2 cystocele Assessment & Plan: - asymptomatic stage II POP - s/p rectocele repair, s/p pelvic floor PT - For treatment of pelvic organ prolapse, we discussed options for management including expectant management, conservative management, and surgical management, such as Kegels, a pessary, pelvic floor physical therapy, and specific surgical procedures. - continue low dose vaginal estrogen and encouraged Kegel exercises - referral sent for pelvic floor PT   Breast tenderness Assessment & Plan: - reports history of painful breast cyst requiring intervention - encouraged pt to follow-up with PCP or gyn if persistent symptoms -  screening MRI negative 12/2023   Time spent: I spent 31 minutes dedicated to the care of this patient on the date of this encounter to include pre-visit review of records, face-to-face time with the patient discussing mixed urinary incontinence, stage II pelvic organ prolapse, breast tenderness, vaginal atrophy, fecal incontinence, and post visit documentation and ordering medication/referral.   [facility]  Edgardo Goodwill, MD

## 2024-05-17 NOTE — Assessment & Plan Note (Addendum)
-   02/15/23 POCT UA negative, PVR WNL - started since husband got sick and menopause - We discussed the symptoms of overactive bladder (OAB), which include urinary urgency, urinary frequency, nocturia, with or without urge incontinence.  While we do not know the exact etiology of OAB, several treatment options exist. We discussed management including behavioral therapy (decreasing bladder irritants, urge suppression strategies, timed voids, bladder retraining), physical therapy, medication; for refractory cases posterior tibial nerve stimulation, sacral neuromodulation, and intravesical botulinum toxin injection.  For anticholinergic medications, we discussed the potential side effects of anticholinergics including dry eyes, dry mouth, constipation, cognitive impairment and urinary retention. For Beta-3 agonist medication, we discussed the potential side effect of elevated blood pressure which is more likely to occur in individuals with uncontrolled hypertension. - For treatment of stress urinary incontinence,  non-surgical options include expectant management, weight loss, physical therapy, as well as a pessary.  Surgical options include a midurethral sling, Burch urethropexy, and transurethral injection of a bulking agent. - continue low dose vaginal estrogen 0.5g twice a week - encouraged fluid management, avoid Kegels and caffeine reduction to <10% if total intake, continue fiber supplementation to optimize bowel consistency - start trial of gemtesa  if refractory symptoms with samples and Rx previously provided - Rx mirabegron if cost prohibitive - referral for pelvic floor PT

## 2024-05-17 NOTE — Assessment & Plan Note (Signed)
-   asymptomatic stage II POP - s/p rectocele repair, s/p pelvic floor PT - For treatment of pelvic organ prolapse, we discussed options for management including expectant management, conservative management, and surgical management, such as Kegels, a pessary, pelvic floor physical therapy, and specific surgical procedures. - continue low dose vaginal estrogen and encouraged Kegel exercises - referral sent for pelvic floor PT

## 2024-05-17 NOTE — Assessment & Plan Note (Addendum)
-   dietary triggers after eating at restaurants - Treatment options include anti-diarrhea medication (loperamide/ Imodium OTC or prescription lomotil), fiber supplements, physical therapy, and possible sacral neuromodulation or surgery.   - trial of IBGuard with worsening GERD symptoms - resolved since fiber supplementation, continue every 1-2 days - referral sent to pelvic floor PT

## 2024-05-17 NOTE — Assessment & Plan Note (Signed)
-   reports history of painful breast cyst requiring intervention - encouraged pt to follow-up with PCP or gyn if persistent symptoms - screening MRI negative 12/2023

## 2024-05-17 NOTE — Assessment & Plan Note (Deleted)
-   reports history of painful breast cyst requiring intervention - encouraged pt to follow-up with PCP or gyn if persistent symptoms - screening MRI negative 12/2023

## 2024-05-17 NOTE — Assessment & Plan Note (Signed)
-   For symptomatic vaginal atrophy options include lubrication with a water-based lubricant, personal hygiene measures and barrier protection against wetness, and estrogen replacement in the form of vaginal cream, vaginal tablets, or a time-released vaginal ring.   - breast tenderness prior to starting vaginal estrogen, declines discontinuation due to relief - encouraged to reduce dose to 0.5g twice a week

## 2024-05-17 NOTE — Patient Instructions (Addendum)
 Reduce vaginal estrogen to 0.5g twice a week.   Continue fiber supplementation 1 capsule every 1-2 days to maintain bowel consistency and reduce bowel leakage.   We discussed the symptoms of overactive bladder (OAB), which include urinary urgency, urinary frequency, night-time urination, with or without urge incontinence.  We discussed management including behavioral therapy (decreasing bladder irritants by following a bladder diet, urge suppression strategies, timed voids, bladder retraining), physical therapy, medication; and for refractory cases posterior tibial nerve stimulation, sacral neuromodulation, and intravesical botulinum toxin injection.   For Beta-3 agonist medication, we discussed the potential side effect of elevated blood pressure which is more likely to occur in individuals with uncontrolled hypertension. You were given samples for Gemtesa  75 mg.  It can take a month to start working so give it time, but if you have bothersome side effects call sooner and we can try a different medication.  Call us  if you have trouble filling the prescription or if it's not covered by your insurance.  Please call [phone] to schedule the earliest appointment for pelvic floor PT.

## 2024-05-18 ENCOUNTER — Encounter: Payer: Self-pay | Admitting: Family Medicine

## 2024-05-18 DIAGNOSIS — F4323 Adjustment disorder with mixed anxiety and depressed mood: Secondary | ICD-10-CM

## 2024-05-19 NOTE — Telephone Encounter (Signed)
 Okay, referral ordered

## 2024-05-20 NOTE — Telephone Encounter (Signed)
 FYI has been reviewed. No further action needed at this time

## 2024-06-08 ENCOUNTER — Ambulatory Visit (INDEPENDENT_AMBULATORY_CARE_PROVIDER_SITE_OTHER): Admitting: Licensed Clinical Social Worker

## 2024-06-08 DIAGNOSIS — F331 Major depressive disorder, recurrent, moderate: Secondary | ICD-10-CM | POA: Diagnosis not present

## 2024-06-08 DIAGNOSIS — F32A Depression, unspecified: Secondary | ICD-10-CM

## 2024-06-08 DIAGNOSIS — F411 Generalized anxiety disorder: Secondary | ICD-10-CM | POA: Diagnosis not present

## 2024-06-08 NOTE — Progress Notes (Signed)
 [facility] Counselor/Therapist Progress Note  Patient ID: Barbara Munoz, MRN: [id number]    Date: 06/08/24  Time Spent: 1057  am - 12 pm :  Minutes  Treatment Type: Individual Therapy.  Presenting Problem Chief Complaint: Patient lost her spouse September 18th of 2024. She has a sister that she is close to has been in hospice for 2 weeks. She has a history of depression anxiety. Patient reports that she has a habit of wearing herself thin and being retired she has more insight into her behavior. Patient reports that her mother and sister were both hospitalized in April at two different hospitals.   What are the main stressors in your life right now, how long? Depression  3, Anxiety   3, Sleep Changes   3, Loss of Interest   3, Excessive Worrying   3, and Panic Attacks   3   Previous mental health services Have you ever been treated for a mental health problem, when, where, by whom? Yes  Patient reports having treatment in 2000 for stress and tension and 2012 patient reports that her husband was diagnosed with prostate cancer and the stress related to that had her in treatment.  Are you currently seeing a therapist or counselor, counselor's name? No   Have you ever had a mental health hospitalization, how many times, length of stay? No   Have you ever been treated with medication, name, reason, response? Yes, Sertraline , Xanax ,  Have you ever had suicidal thoughts or attempted suicide, when, how? No NA  Risk factors for Suicide Demographic factors:  Age 73 or older, Divorced or widowed, Caucasian, Living alone, and Unemployed Current mental status: No plan to harm self or others Loss factors: Decrease in vocational status and Loss of significant relationship Historical factors: NA Risk Reduction factors: Religious beliefs about death Clinical factors:  Severe Anxiety and/or Agitation Depression:   NA Cognitive features that contribute to risk: NA    SUICIDE RISK:   Minimal: No identifiable suicidal ideation.  Patients presenting with no risk factors but with morbid ruminations; may be classified as minimal risk based on the severity of the depressive symptoms  Medical history Medical treatment and/or problems, explain: Yes Patient reports high cholesterol, orthostatic hypotension, overactive bladder, seasonal allergies.  Do you have any issues with chronic pain?  Yes, Fibromyalgia, patient denies daily pain but takes her medication when needed.  Name of primary care physician/last physical exam: Anselm Kingf[facility] in [facility]  Allergies: Yes, seasonal Allergies Medication, reactions? NA   Current medications:  ALPRAZolam  (XANAX ) 0.25 MG tablet Taking as Needed Taking Differently Not Taking Unknown        0.25 mg, 2 times daily PRN     atorvastatin  (LIPITOR) 20 MG tablet Taking Taking Differently Not Taking Unknown       20 mg, Daily     estradiol  (ESTRACE ) 0.1 MG/GM vaginal cream Taking Taking Differently Not Taking Unknown            fludrocortisone  (FLORINEF ) 0.1 MG tablet Taking Taking Differently Not Taking Unknown       100 mcg, Daily     fluticasone  (FLONASE ) 50 MCG/ACT nasal spray Taking Taking Differently Not Taking Unknown            loratadine (CLARITIN) 10 MG tablet Taking as Needed Taking Differently Not Taking Unknown       10 mg, Daily PRN     Melatonin 5 MG CAPS Taking as Needed Taking Differently Not Taking  Unknown       5 mg, As needed  Patient not taking. Reported on 06/08/2024   meloxicam  (MOBIC ) 7.5 MG tablet Taking Taking Differently Not Taking Unknown            midodrine  (PROAMATINE ) 5 MG tablet Taking Taking Differently Not Taking Unknown            mirabegron  ER (MYRBETRIQ ) 25 MG TB24 tablet Taking Taking Differently Not Taking Unknown       25 mg, Daily  Patient not taking. Reason: Patient Preference, Reported on 06/08/2024   pantoprazole  (PROTONIX ) 40 MG tablet Taking as  Needed Taking Differently Not Taking Unknown       40 mg, Daily PRN     sertraline  (ZOLOFT ) 100 MG tablet Taking Taking Differently Not Taking Unknown       200 mg, Daily at bedtime     valACYclovir  (VALTREX ) 1000 MG tablet Taking Taking Differently Not Taking Unknown           Prescribed by: [facility] in [facility]  Is there any history of mental health problems or substance abuse in your family, whom? Yes  Patient reports maternal Barbara Munoz spent time in a mental facility. Several other Maternal Aunts and Maternal Grandmother both displayed depression symptoms.  Has anyone in your family been hospitalized, who, where, length of stay? Yes Aunt Barbara Munoz (Maternal Aunt) unsure of length of stay.   Social/family history Have you been married, how many times?  1  Do you have children?  2  How many pregnancies have you had?  3  Who lives in your current household? Patient lives alone  Military history: No NA  Religious/spiritual involvement: Attends [employer] religion/faith base are you? Protestant  Family of origin (childhood history)  Parents, patient, 3 sisters and 1 brother  Where were you born? [facility]  Where did you grow up? [place]  How many different homes have you lived? 3  Describe the atmosphere of the household where you grew up: Stable environment, strict, high expect ions for behavior, playing and fun.  Do you have siblings, step/half siblings, list names, relation, sex, age? Yes Barbara Munoz-70, Barbara Munoz-68, Barbara Munoz-62, Barbara Munoz-60.  Are your parents separated/divorced, when and why? No Parent remained married for 59 years when father passed away.  Are your parents alive? No Father passed in 2007, mother is still living.  Social supports (personal and professional): Younger daughter Barbara Munoz, she lives in [place].  Education How many grades have you completed? post college graduate work or degree Did you have any problems  in school, what type? No  Medications prescribed for these problems? 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   Legal history: Denied Legal issues   Trauma/Abuse history: Have you ever been exposed to any form of abuse, what type? No NA  Have you ever been exposed to something traumatic, describe? No NA  Substance use Do you use Caffeine? Yes Type, frequency? Periodic use, more days than not a 10 oz serving daily approx.  Do you use Nicotine? No Type, frequency, ppd? NA   Do you use Alcohol? Yes Type, frequency? Periodic/Social  How old were you when you first tasted alcohol? 17  Was this accepted by your family? Parents didn't know.  When was your last drink, type, how much? 1 beer last night  Have you ever used illicit drugs or taken more than prescribed, type, frequency, date of last  usage? No NA  Mental Status: General Appearance Barbara Munoz:  Casual Eye Contact:  Good Motor Behavior:  Normal Speech:  Normal Level of Consciousness:  Alert Mood:  Depressed Affect:  Depressed Anxiety Level:  Moderate Thought Process:  Coherent Thought Content:  WNL Perception:  Normal Judgment:  Good Insight:  Present Cognition:  Orientation time, place, and person  Diagnosis AXIS I Major Depression, Recurrent moderate, Generalized Anxiety Disorder  AXIS II No diagnosis  AXIS III high cholesterol, orthostatic hypotension, overactive bladder, seasonal allergies  AXIS IV problems related to social environment and problems with primary support group  AXIS V 51-60 moderate symptoms    Subjective:   Barbara Munoz participated in person from office located at [employer]. Barbara Munoz consented to treatment. Therapist participated from office located at [facility] with patient present.   Interventions: Cognitive Behavioral Therapy, Dialectical Behavioral Therapy, Assertiveness/Communication, Motivational Interviewing,  Solution-Oriented/Positive Psychology, and Grief Therapy  Diagnosis: Major Depressive Disorder, recurrent moderate, generalized anxiety disorder. Individualized Treatment Plan Strengths: Barbara Munoz is caring and responsible.  Supports: Barbara Munoz reports that her daughter Barbara Munoz is a support.   Goal/Needs for Treatment:  In order of importance to patient 1) I want to be more open and honest with myself. 2) I want to find purpose in my life.    Client Statement of Needs: Patient desires to improve her anxiety and depressive symptoms.   Treatment Level:Moderate-Bi weekly  Symptoms:Patient reports having treatment in 2000 for stress and tension and 2012 patient reports that her husband was diagnosed with prostate cancer and the stress related to that had her in treatment. Patient reports depression and anxiety as well as grief.  Client Treatment Preferences:Face To Face Cognitive Behavioral Therapy   Healthcare consumer's goal for treatment:  Therapist, Keenan Pastor MSW, LCSW will support the patient's ability to achieve the goals identified. Cognitive Behavioral Therapy, Assertive Communication/Conflict Resolution Training, Relaxation Training, ACT, Humanistic and other evidenced-based practices will be used to promote progress towards healthy functioning.   Healthcare consumer will: Actively participate in therapy, working towards healthy functioning.    *Justification for Continuation/Discontinuation of Goal: R=Revised, O=Ongoing, A=Achieved, D=Discontinued  Goal 1) I want to be more open and honest with myself. Baseline date 06/08/2024: Progress towards goal Ongoing; How Often - Daily Target Date Goal Was reviewed Status Code Progress towards goal/Likert rating  06/08/2025  O Ongoing            Floy will:  Ask Yourself Better Questions. Stop with the surface-level nonsense. ... Catch Yourself in the Act. ... Own Your Feelings (Even the [employer]) ... Accept Where You Are (But  Don't Settle There) ... Make Small, Honest Commitments.  Goal 2) I want to find purpose in my life. Baseline date 06/08/2024: Progress towards goal Ongoing; How Often - Daily Target Date Goal Was reviewed Status Code Progress towards goal  06/08/2025  O Ongoing            Self-Reflection and Awareness: Identify your values: What principles are most important to you?  Explore your passions: What activities do you genuinely enjoy and find yourself drawn to?  Understand your strengths: What are you naturally good at?  Reflect on past experiences: How have past challenges and successes shaped you?  Action and Engagement: Try new things: Step outside your comfort zone and experiment with different activities and hobbies.  Connect with others: Seek out communities and individuals who share your interests and values.  Help others: Find ways to contribute to something larger than  yourself, whether through volunteering or other forms of service.  Set purpose-driven goals: Focus on goals that align with your values and passions, rather than solely focusing on short-term gains, according to Lynnae Sarna.  Personal Growth and Fulfillment: Embrace challenges: View obstacles as opportunities for growth and learning.  Practice mindfulness: Pay attention to the present moment and cultivate self-awareness.  Be patient and kind to yourself: Finding purpose is a journey, not a destination, according to [employer].  Celebrate your progress: Acknowledge and appreciate the steps you've taken along the way.  Creating a Meaningful Life: Craft a personal vision statement: Define what a meaningful life looks like for you, according to [employer].  Find joy and satisfaction: Pursue activities and relationships that bring you happiness and fulfillment.  Make a positive impact: Contribute to the world in a way that aligns with your values and passions.  This plan has been reviewed and created by the following  participants:  This plan will be reviewed at least every 12 months. Date Behavioral Health Clinician Date Guardian/Patient   06/08/2024 Keenan Pastor MSW, LCSW  06/08/2024 Verbal Consent Provided                   Keenan Pastor MSW, LCSW/DATE 06/08/2024

## 2024-06-17 ENCOUNTER — Other Ambulatory Visit: Payer: Self-pay | Admitting: Obstetrics

## 2024-06-17 DIAGNOSIS — N3946 Mixed incontinence: Secondary | ICD-10-CM

## 2024-06-20 ENCOUNTER — Ambulatory Visit (INDEPENDENT_AMBULATORY_CARE_PROVIDER_SITE_OTHER): Admitting: Licensed Clinical Social Worker

## 2024-06-20 DIAGNOSIS — F418 Other specified anxiety disorders: Secondary | ICD-10-CM | POA: Diagnosis not present

## 2024-06-20 DIAGNOSIS — F32A Depression, unspecified: Secondary | ICD-10-CM

## 2024-06-20 DIAGNOSIS — F419 Anxiety disorder, unspecified: Secondary | ICD-10-CM

## 2024-06-20 NOTE — Progress Notes (Signed)
 [facility] Counselor/Therapist Progress Note  Patient ID: Barbara Munoz, MRN: [id number]    Date: 06/20/24  Time Spent: 0903  am - 1000 am : 57 Minutes  Treatment Type: Individual Therapy.  Reported Symptoms: Patient lost her spouse September 18th of 2024. She has a sister that she is close to has been in hospice for 2 weeks and now she has passed away since out last meeting. She has a history of depression anxiety. Patient reports that she has a habit of wearing herself thin and being retired she has more insight into her behavior. Patient reports that her mother and sister were both hospitalized in April at two different hospitals.   Mental Status Exam: Appearance:  Casual     Behavior: Appropriate  Motor: Normal  Speech/Language:  Clear and Coherent  Affect: Flat  Mood: depressed  Thought process: normal  Thought content:   WNL  Sensory/Perceptual disturbances:   WNL  Orientation: oriented to person, place, time/date, situation, day of week, month of year, and year  Attention: Good  Concentration: Good  Memory: WNL  Fund of knowledge:  Good  Insight:   Good  Judgment:  Good  Impulse Control: Good   Risk Assessment: Danger to Self:  No Self-injurious Behavior: No Danger to Others: No Duty to Warn:no Physical Aggression / Violence:No  Access to Firearms a concern: No  Gang Involvement:No   Subjective:   [name] participated in person from office, located at [employer].  Danaysia consented to treatment. Therapist participated in person from [place] office.   Zhane presented for her session appearing to be depressed. Patient reports that she lost her sister last week and had her funeral over the weekend. Ceceilia reports that she is sad but also at peace knowing she isn't suffering anymore.  Seda shared that she has been journaling some of her memories from childhood and her town she grew up in. Yazleen shared that she has been having a variety of  emotions over the past few weeks. Yatziry shared that losing her sister has reminded her of the beautiful they had as children and the life they knew.  Clinician actively listened and allowed patient to share her emotions. Patient was in need of a listening ear to hear her. Clinician also processed these nostalgic moments with patient and why they were so meaningful to her. Clinician encouraged patient to continue to journal and focus on the past, present and future. Clinician encouraged patient to think of what she would like fro her future that would go with the beautiful story of her past.   Tayte will continue to journal and utilize coping skills to assist with decreasing her symptoms of depression and anxiety. Esmee will continue to attend and engage in bi weekly therapy sessions. Treatment planning will be reviewed by 06/08/2025. Oktober was pleasant and cooperative and fully engaged in session.  Interventions: Cognitive Behavioral Therapy, Motivational Interviewing and psycho education. Clinician conducted session in person at clinician's office at [facility]. Reviewed events since last session. Assessed patient's mood since last session and current mood. Clinician reviewed diagnoses and treatment recommendations. Provided psycho education related to diagnoses and treatment.   Diagnosis: Anxiety and Depression   Damien Junk MSW, LCSW/DATE 06/20/2024

## 2024-06-22 ENCOUNTER — Encounter: Payer: Self-pay | Admitting: Obstetrics

## 2024-06-27 ENCOUNTER — Other Ambulatory Visit: Payer: Self-pay | Admitting: Obstetrics and Gynecology

## 2024-06-27 MED ORDER — VIBEGRON 75 MG PO TABS: 75.0000 mg | ORAL_TABLET | Freq: Every day | ORAL | 2 refills | Status: DC

## 2024-07-05 ENCOUNTER — Ambulatory Visit (INDEPENDENT_AMBULATORY_CARE_PROVIDER_SITE_OTHER): Admitting: Licensed Clinical Social Worker

## 2024-07-05 DIAGNOSIS — F418 Other specified anxiety disorders: Secondary | ICD-10-CM | POA: Diagnosis not present

## 2024-07-05 DIAGNOSIS — F419 Anxiety disorder, unspecified: Secondary | ICD-10-CM

## 2024-07-05 DIAGNOSIS — F32A Depression, unspecified: Secondary | ICD-10-CM | POA: Diagnosis not present

## 2024-07-05 NOTE — Progress Notes (Signed)
 [facility] Counselor/Therapist Progress Note  Patient ID: Barbara Munoz, MRN: [id number]    Date: 07/05/24  Time Spent: 0904  am - 1001 am : 57 Minutes  Treatment Type: Individual Therapy.  Reported Symptoms: Patient lost her spouse September 18th of 2024. She has a sister that she is close to has been in hospice for 2 weeks and now she has passed away since out last meeting. She has a history of depression anxiety. Patient reports that she has a habit of wearing herself thin and being retired she has more insight into her behavior. Patient reports that her mother and sister were both hospitalized in April at two different hospitals.    Mental Status Exam: Appearance:  Casual     Behavior: Appropriate  Motor: Normal  Speech/Language:  Clear and Coherent  Affect: Flat  Mood: depressed  Thought process: normal  Thought content:   WNL  Sensory/Perceptual disturbances:   WNL  Orientation: oriented to person, place, time/date, situation, day of week, month of year, and year  Attention: Good  Concentration: Good  Memory: WNL  Fund of knowledge:  Good  Insight:   Good  Judgment:  Good  Impulse Control: Good    Risk Assessment: Danger to Self:  No Self-injurious Behavior: No Danger to Others: No Duty to Warn:no Physical Aggression / Violence:No  Access to Firearms a concern: No  Gang Involvement:No    Subjective:    Barbara Munoz participated in person from office, located at [employer].  Barbara Munoz consented to treatment. Therapist participated in person from [place] office.   Barbara Munoz for her session in a calm and relaxed mood. Barbara Munoz that she has been going to church and has met a new friend there. She also reports that she filled in for her pastor on Sunday due to him being out. She reports that she has been finding peace in her bible readings and her new found friendships. Patient reports that she is working to find positivity and not be  negative in her thoughts. Barbara Munoz reports that she is going to the beach with her family in a few weeks and is looking forward to the time with them.  Clinician actively listened and engaged with patient via verbal interaction. Clinician processed with patient that having a positive attitude is essential in re framing ones thoughts. Clinician processed  with patient the importance of living her life and expressing her joy as a way to help others. Clinician and patient discussed her goal of enjoying her life and not feeling guilty that she is alive and her spouse is gone.  Barbara Munoz was active in session discussion and was polite and cooperative. Barbara Munoz will continue to utilize coping skills to assist in decreasing her symptoms of depression and anxiety. Barbara Munoz will continue to engage in bi weekly therapy sessions. Treatment planning will be reviewed by 06/08/2025.  Interventions: Cognitive Behavioral Therapy, Motivational Interviewing and psycho education. Clinician conducted session in person at clinician's office at [facility]. Reviewed events since last session. Assessed patient's mood since last session and current mood. Clinician reviewed diagnoses and treatment recommendations. Provided psycho education related to diagnoses and treatment.   Diagnosis: Anxiety and Depression   Damien Junk MSW, LCSW/DATE 07/05/2024

## 2024-07-26 ENCOUNTER — Ambulatory Visit: Admitting: Licensed Clinical Social Worker

## 2024-07-27 ENCOUNTER — Ambulatory Visit (INDEPENDENT_AMBULATORY_CARE_PROVIDER_SITE_OTHER): Admitting: Licensed Clinical Social Worker

## 2024-07-27 DIAGNOSIS — F419 Anxiety disorder, unspecified: Secondary | ICD-10-CM | POA: Diagnosis not present

## 2024-07-27 DIAGNOSIS — F32A Depression, unspecified: Secondary | ICD-10-CM

## 2024-07-27 DIAGNOSIS — F418 Other specified anxiety disorders: Secondary | ICD-10-CM

## 2024-07-27 NOTE — Progress Notes (Signed)
 [facility] Counselor/Therapist Progress Note  Patient ID: Barbara Munoz, MRN: [id number]    Date: 07/27/24  Time Spent: 0800  am - 0857 am : 57 Minutes  Treatment Type: Individual Therapy.  Patient lost her spouse September 18th of 2024. She has a sister that she is close to has been in hospice for 2 weeks and now she has passed away. She has a history of depression anxiety. Patient reports that she has a habit of wearing herself thin and being retired she has more insight into her behavior. Patient reports that her mother and sister were both hospitalized in April at two different hospitals.    Mental Status Exam: Appearance:  Casual     Behavior: Appropriate  Motor: Normal  Speech/Language:  Clear and Coherent  Affect: Flat  Mood: depressed  Thought process: normal  Thought content:   WNL  Sensory/Perceptual disturbances:   WNL  Orientation: oriented to person, place, time/date, situation, day of week, month of year, and year  Attention: Good  Concentration: Good  Memory: WNL  Fund of knowledge:  Good  Insight:   Good  Judgment:  Good  Impulse Control: Good    Risk Assessment: Danger to Self:  No Self-injurious Behavior: No Danger to Others: No Duty to Warn:no Physical Aggression / Violence:No  Access to Firearms a concern: No  Gang Involvement:No    Subjective:    [name] participated in person from office, located at [employer].  Yaslene consented to treatment. Therapist participated in person from [place] office.   Harlynn presented for her session in a positive mood. She reports that she went to the beach with one of her daughters and her family. She reports it was her first trip since her husband passed. Niquita states while she was there she received a call from a good friend  whose spouse passed. She reports that she has been a support since returning from her vacation to this friend. She stats it has been good to help someone else and  they have been good for each other. Patient reports that she has found that her grief has been gradual and that she can see where there are certain areas that she had already grieved prior to her husbands passing but other areas that are just appearing such as the beach trip and the emotions she experienced.  Clinician actively listened as patient processed her feelings and shared her thoughts. Clinician processed with patient the 5 stages of grief and how that grief is like an ebb and flow. Clinician and patient discussed how talking about her emotions and letting herself cry when she feels the need is important in dealing with her emotions related to her grief.   Don was fully engaged in session discussion and freely processed her emotions with Clinician. Patient will continue to utilize coping skills for her depression and anxiety that is often exacerbated by her grief. Patient will continue to engage in bi weekly therapy sessions. Treatment planning to be reviewed by 06/08/2025.  Interventions: Cognitive Behavioral Therapy, Dialectical Behavioral Therapy, Motivational Interviewing, and Grief Therapy  Diagnosis: Depression and anxiety    Damien Junk MSW, LCSW/DATE 07/27/2024

## 2024-07-28 ENCOUNTER — Ambulatory Visit: Attending: Obstetrics | Admitting: Physical Therapy

## 2024-07-28 DIAGNOSIS — R279 Unspecified lack of coordination: Secondary | ICD-10-CM | POA: Insufficient documentation

## 2024-07-28 DIAGNOSIS — R293 Abnormal posture: Secondary | ICD-10-CM | POA: Insufficient documentation

## 2024-07-28 DIAGNOSIS — M6281 Muscle weakness (generalized): Secondary | ICD-10-CM | POA: Diagnosis present

## 2024-07-28 NOTE — Patient Instructions (Signed)

## 2024-07-28 NOTE — Therapy (Signed)
 OUTPATIENT PHYSICAL THERAPY FEMALE PELVIC EVALUATION   Patient Name: Barbara Munoz MRN: [id number] DOB:1950-12-23, 73 y.o., female Today's Date: 07/28/2024  END OF SESSION:  PT End of Session - 07/28/24 1101     Visit Number 1    Date for PT Re-Evaluation 10/28/24    Authorization Type [employer]    Authorization Time Period awaiting auth    PT Start Time 1100    PT Stop Time 1139    PT Time Calculation (min) 39 min    Activity Tolerance Patient tolerated treatment well    Behavior During Therapy [facility] for tasks assessed/performed          Past Medical History:  Diagnosis Date   Anxiety and depression 09/08/2011   Diverticulosis    12/2022 colonoscopy   Female bladder prolapse    GERD (gastroesophageal reflux disease) 09/05/2011   Improved signif with PPI   History of adenomatous polyp of colon 08/2017   History of Clostridium difficile infection 2007   colonic changes c/w with this dx seen on colonoscopy 2007---sx's responded to flagyl .   History of cyst of breast    multiple drained during menopause   Hyperlipidemia    Hypotension 2022   ?idiopathic   IFG (impaired fasting glucose) 06/2018   Fastings 105-115 range when pt checked with husband's glucometer.  Fasting gluc here was 98 and A1c 5.6% 07/01/18.   Insomnia 09/05/2011   Migraine    worst during menopause   Orthostatic hypotension 11/20/2021   Overweight (BMI 25.0-29.9) 09/05/2011   Palpitations    rare PACs and PVCs on monitoring 12/2023   Seasonal allergic rhinitis    Subclinical hypothyroidism    Vertigo, benign positional 09/08/2011   Past Surgical History:  Procedure Laterality Date   CARDIOVASCULAR STRESS TEST  02/15/2015   Exercise myocard perfusion testing: Normal EF, normal wall motion, normal perfusion.  Poor exercise capacity (4 min).   COLONOSCOPY  07/2006; 08/25/17   2007: pseudomembranous colitis, o/w normal. (Dr. Teressa).  08/2017: adenomas.  12/2022 adenomas.   Coronary calcium  score     2024  ZERO   COSMETIC SURGERY     DEXA  06/19/09; 01/19/2019   Normal 2010, 2020, 2024   ESOPHAGOGASTRODUODENOSCOPY     01/02/23, normal   POSTERIOR REPAIR  2012   REFRACTIVE SURGERY  05/2022   TONSILLECTOMY  1982   TRANSTHORACIC ECHOCARDIOGRAM  01/06/2022   NORMAL   Transvaginal ultrasound  10/2016   Normal (done for LLQ pain by Dr. Criselda).   TUBAL LIGATION  1983   vaginal hernia repair  05/2011   rectocele repair   ZIO monitoring  12/2023   rare PACs and PVCs   Patient Active Problem List   Diagnosis Date Noted   Breast tenderness 05/17/2024   Urinary incontinence, mixed 02/16/2024   Vaginal atrophy 02/16/2024   History of pelvic surgery 02/16/2024   Feeling of incomplete bladder emptying 02/16/2024   Nocturia 02/16/2024   Incontinence of feces 02/16/2024   Orthostatic hypotension 11/20/2021   Breast lump 11/07/2019   Maxillary sinusitis 08/08/2015   Interdigital neuroma 11/06/2014   Health maintenance examination 11/03/2013   Preventative health care 11/09/2012   Anxiety and depression 09/08/2011   Pelvic organ prolapse quantification stage 2 cystocele    GERD (gastroesophageal reflux disease) 09/05/2011   Overweight (BMI 25.0-29.9) 09/05/2011   Insomnia 09/05/2011   Hyperlipidemia    Migraine     PCP: Candise Aleene DEL, MD   REFERRING PROVIDER: Guadlupe Lianne DASEN, MD  REFERRING DIAG: R15.9 (ICD-10-CM) - Incontinence of feces, unspecified fecal incontinence type R35.1 (ICD-10-CM) - Nocturia N39.46 (ICD-10-CM) - Urinary incontinence, mixed  THERAPY DIAG:  Muscle weakness (generalized)  Unspecified lack of coordination  Abnormal posture  Rationale for Evaluation and Treatment: Rehabilitation  ONSET DATE: 12 years  SUBJECTIVE:                                                                                                                                                                                           SUBJECTIVE STATEMENT: Has had chronic urinary  incontinence and worsened over time, has orthostatic hypertension and drinks a lot of water. Increased frequency of urine, feels like every time she gets home from errands and needs to unlock the door she can't make it to the bathroom. Rarely with stressors.  Sometimes will have fecal incontinence, also feels constipated often.   Fluid intake: water -   PAIN:  Are you having pain? No   PRECAUTIONS: None  RED FLAGS: None   WEIGHT BEARING RESTRICTIONS: No  FALLS:  Has patient fallen in last 6 months? No  OCCUPATION: retired  ACTIVITY LEVEL : low  PLOF: Independent  PATIENT GOALS: to get stronger overall, regular bowel movements, no leakage  PERTINENT HISTORY:  Depression, Diverticulosis, Female bladder prolapse, Clostridium difficile infection, rectocele repair, Vaginal atrophy, Orthostatic hypotension, 2 cystocele  Sexual abuse: No  BOWEL MOVEMENT: Pain with bowel movement: No Type of bowel movement:Type (Bristol Stool Scale) varies from 2-6 but right now usually 2-3, Frequency every 3rd day, and Strain not often Fully empty rectum: No Leakage: Yes: smearing when thinks she is passing gas sometimes mucus Pads: No Fiber supplement/laxative Yes attempted miralax   URINATION: Pain with urination: No Fully empty bladder: No Stream: Weak Urgency: Yes  Frequency: not quicker than every 2 hours during the day, does do just in case; 1x nightly  Leakage: Urge to void and Walking to the bathroom Pads: No  INTERCOURSE:  Ability to have vaginal penetration No  Pain with intercourse: none   PREGNANCY: Vaginal deliveries 2 Episiotomy Yes  C-section deliveries 0 Currently pregnant No  PROLAPSE: Pressure felt vaginally and did have rectocele repaired previously   OBJECTIVE:  Note: Objective measures were completed at Evaluation unless otherwise noted.  DIAGNOSTIC FINDINGS:    PATIENT SURVEYS:    PFIQ-7: 71  COGNITION: Overall cognitive status: Within  functional limits for tasks assessed     SENSATION: Light touch: Appears intact  LUMBAR SPECIAL TESTS:  Single leg stance test: pelvic instability noted bil  FUNCTIONAL TESTS:  Functional squat limited in descent bu 25% and bil knee valgus  GAIT: WFL  POSTURE: rounded shoulders, forward head, and posterior pelvic tilt   LUMBARAROM/PROM:  A/PROM A/PROM  eval  Flexion Limited by 25%  Extension WFL  Right lateral flexion Limited by 25%  Left lateral flexion Limited by 25%  Right rotation WFL  Left rotation WFL   (Blank rows = not tested)  LOWER EXTREMITY ROM:  Bil hamstrings and adductors limited by 25%  LOWER EXTREMITY MMT:  Bil hips grossly 4/5 PALPATION:   General: no TTP but tight bil lumbar paraspinals and gluteals   Pelvic Alignment: WFL  Abdominal: no TTP but mild fascial restrictions in all quadrants                 External Perineal Exam: [facility]                             Internal Pelvic Floor: tightness on Rt superficial and deep layer with TTP  Patient confirms identification and approves PT to assess internal pelvic floor and treatment Yes No emotional/communication barriers or cognitive limitation. Patient is motivated to learn. Patient understands and agrees with treatment goals and plan. PT explains patient will be examined in standing, sitting, and lying down to see how their muscles and joints work. When they are ready, they will be asked to remove their underwear so PT can examine their perineum. The patient is also given the option of providing their own chaperone as one is not provided in our facility. The patient also has the right and is explained the right to defer or refuse any part of the evaluation or treatment including the internal exam. With the patient's consent, PT will use one gloved finger to gently assess the muscles of the pelvic floor, seeing how well it contracts and relaxes and if there is muscle symmetry. After, the patient will get  dressed and PT and patient will discuss exam findings and plan of care. PT and patient discuss plan of care, schedule, attendance policy and HEP activities.  PELVIC MMT:   MMT eval  Vaginal 3/5, 9s, 4 reps  Internal Anal Sphincter   External Anal Sphincter   Puborectalis   Diastasis Recti   (Blank rows = not tested)        TONE: Increased at Rt side  PROLAPSE: Not seen ant/post in hooklying with coughs  TODAY'S TREATMENT:                                                                                                                              DATE:   07/28/24 EVAL Examination completed, findings reviewed, pt educated on POC, HEP, and voiding mechanics and urge drill. Pt motivated to participate in PT and agreeable to attempt recommendations.     PATIENT EDUCATION:  Education details: [id number] Person educated: Patient Education method: Explanation, Demonstration, Actor cues, Verbal cues, and Handouts Education comprehension: verbalized understanding, returned demonstration, verbal cues required, tactile cues  required, and needs further education  HOME EXERCISE PROGRAM: [id number]  ASSESSMENT:  CLINICAL IMPRESSION: Patient is a 73 y.o. female  who was seen today for physical therapy evaluation and treatment for urine and fecal incontinence and increased urinary frequency. Pt found to have decreased flexibility in spine and hips, decreased core and hip strength, decreased pelvic stability. Patient consented to internal pelvic floor assessment vaginally this date and found to have decreased strength, endurance, and coordination. And TTP with tension at Rt side of pelvic floor. Pt has had pelvic floor PT prior ~3 years ago per pt, and had good understanding of how to do pelvic floor activation and relaxation but tension and TTP present. Pt denied questions about urge drill and voiding mechanics. Pt would benefit from additional PT to further address deficits.    OBJECTIVE IMPAIRMENTS:  decreased activity tolerance, decreased coordination, decreased endurance, decreased mobility, decreased strength, increased fascial restrictions, increased muscle spasms, impaired flexibility, improper body mechanics, postural dysfunction, and pain.   ACTIVITY LIMITATIONS: continence  PARTICIPATION LIMITATIONS: community activity  PERSONAL FACTORS: Time since onset of injury/illness/exacerbation and 1 comorbidity: medical history are also affecting patient's functional outcome.   REHAB POTENTIAL: Good  CLINICAL DECISION MAKING: Stable/uncomplicated  EVALUATION COMPLEXITY: Low   GOALS: Goals reviewed with patient? Yes  SHORT TERM GOALS: Target date: 08/25/24  Pt to be I with HEP for carry over and continuing recommendations for improved outcomes.   Baseline: Goal status: INITIAL  2.  Pt will be independent with the knack, urge suppression technique, and double voiding in order to improve bladder habits and decrease urinary incontinence.   Baseline:  Goal status: INITIAL  3.  Pt will be independent with use of squatty potty, relaxed toileting mechanics, and improved bowel movement techniques in order to increase ease of bowel movements and complete evacuation.   Baseline:  Goal status: INITIAL  4.  Pt to be I with pressure management with lifting 5# without feeling pressure vaginally or rectally without compensatory strategies to decreased strain at pelvic floor.  Baseline:  Goal status: INITIAL   LONG TERM GOALS: Target date: 10/28/24  Pt to be I with advanced HEP for carry over and continuing recommendations for improved outcomes.   Baseline:  Goal status: INITIAL  2.  Pt to demonstrate improved coordination of pelvic floor and breathing mechanics with 10# squat with appropriate synergistic patterns to decrease pain and leakage at least 75% of the time for improved ability to complete a 30 minute walk without strain at pelvic floor and symptoms.    Baseline:  Goal status:  INITIAL  3.  Pt to report no fecal leakage in at least 1 month due to improved pelvic floor strength and coordination for improved confidence with community outings.  Baseline:  Goal status: INITIAL  4.  Pt to report no urinary leakage in at least 1 week for improved confidence with community outings.  Baseline:  Goal status: INITIAL   PLAN:  PT FREQUENCY: 1x/week  PT DURATION: 8 sessions  PLANNED INTERVENTIONS: 97110-Therapeutic exercises, 97530- Therapeutic activity, 97112- Neuromuscular re-education, 97535- Self Care, 02859- Manual therapy, [phone]- Canalith repositioning, [id number]- Aquatic Therapy, [phone] (1-2 muscles), [id number] (3+ muscles)- Dry Needling, Patient/Family education, Taping, Joint mobilization, Spinal mobilization, Scar mobilization, DME instructions, Cryotherapy, Moist heat, and Biofeedback  PLAN FOR NEXT SESSION: coordination of pelvic floor with core and hip strengthening   Darryle Navy, PT, DPT 08/07/252:59 PM  [facility] [address], [address] [place], [place] [address] Phone # [phone]  Fax [phone]

## 2024-08-15 ENCOUNTER — Ambulatory Visit (INDEPENDENT_AMBULATORY_CARE_PROVIDER_SITE_OTHER): Admitting: Licensed Clinical Social Worker

## 2024-08-15 DIAGNOSIS — F32A Depression, unspecified: Secondary | ICD-10-CM | POA: Diagnosis not present

## 2024-08-15 DIAGNOSIS — F4321 Adjustment disorder with depressed mood: Secondary | ICD-10-CM | POA: Diagnosis not present

## 2024-08-15 DIAGNOSIS — F419 Anxiety disorder, unspecified: Secondary | ICD-10-CM | POA: Diagnosis not present

## 2024-08-15 NOTE — Progress Notes (Signed)
 [facility] Counselor/Therapist Progress Note  Patient ID: Barbara Munoz, MRN: [id number]    Date: 08/15/24  Time Spent: 0902  am - 1000 am : 58 Minutes  Treatment Type: Individual Therapy.  Patient lost her spouse September 18th of 2024. She has a sister that she is close to has been in hospice for 2 weeks and now she has passed away. She has a history of depression anxiety. Patient reports that she has a habit of wearing herself thin and being retired she has more insight into her behavior. Patient reports that her mother and sister were both hospitalized in April at two different hospitals.    Mental Status Exam: Appearance:  Casual     Behavior: Appropriate  Motor: Normal  Speech/Language:  Clear and Coherent  Affect: Flat  Mood: depressed  Thought process: normal  Thought content:   WNL  Sensory/Perceptual disturbances:   WNL  Orientation: oriented to person, place, time/date, situation, day of week, month of year, and year  Attention: Good  Concentration: Good  Memory: WNL  Fund of knowledge:  Good  Insight:   Good  Judgment:  Good  Impulse Control: Good    Risk Assessment: Danger to Self:  No Self-injurious Behavior: No Danger to Others: No Duty to Warn:no Physical Aggression / Violence:No  Access to Firearms a concern: No  Gang Involvement:No    Subjective:    Barbara Munoz participated in person from office, located at [employer].  Barbara Munoz consented to treatment. Therapist participated in person from [place] office.   Barbara Munoz presented to her session showing Clinician her hobby. She brought a beautiful quilt she has been working on hammering leaves to [employer]. She reports that she finds herself focusing on her hobby versus doing things in her home that she knows she needs to do. Barbara Munoz reports that she has been very emotional lately and has found herself feeling triggered by things and not being able to control her emotions. Patient pointed  out that the 1 year anniversary of her husbands death is approaching and she feels that could be playing a big part of her emotions. She also reports that her mother is turning 63 and she had an appointment with her cardiologist and they were wanting to perform a procedure on her. Patient reports all she could do was cry and her sister pointed out that it could be due to patients spouse dying in surgery.   Clinician actively listened and provided support to patient. Clinician encouraged patient to give herself grace and recognize that anniversaries always bring up emotions that we weren't aware existed. Clinician processed with patient that not spending time in the house cleaning at this point in her grief may be a good thing. We discussed that her hobby may be providing a sense of calm and this is what she needs at this time. Clinician encouraged patient to journal about some of her emotions and recognize that feeling sad and triggered is common at this point in the grieving process.  Barbara Munoz was fully engaged in therapy session and was transparent as she shared her feelings. Barbara Munoz was motivated for treatment and is insightful as to how to cope by using her creativity in her hobbies. Barbara Munoz and Clinician processed that she has had many triggers along with the year anniversary that has likely increased her strong emotions of grief. Patient is to use CBT, mindfulness and coping skills to help manage decrease symptoms associated with their diagnosis. Treatment planning to  be reviewed by 06/08/2025.   Interventions: Cognitive Behavioral Therapy, Dialectical Behavioral Therapy, Motivational Interviewing, Solution-Oriented/Positive Psychology, and Grief Therapy  Diagnosis: Anxiety and Depression/Grief   Damien Junk MSW, LCSW/DATE 08/15/2024

## 2024-08-17 ENCOUNTER — Encounter: Payer: Self-pay | Admitting: Obstetrics

## 2024-08-17 ENCOUNTER — Ambulatory Visit: Admitting: Obstetrics

## 2024-08-17 VITALS — BP 121/79 | HR 75

## 2024-08-17 DIAGNOSIS — N3946 Mixed incontinence: Secondary | ICD-10-CM

## 2024-08-17 DIAGNOSIS — Z9889 Other specified postprocedural states: Secondary | ICD-10-CM

## 2024-08-17 DIAGNOSIS — R3914 Feeling of incomplete bladder emptying: Secondary | ICD-10-CM

## 2024-08-17 DIAGNOSIS — N952 Postmenopausal atrophic vaginitis: Secondary | ICD-10-CM

## 2024-08-17 MED ORDER — VIBEGRON 75 MG PO TABS: 75.0000 mg | ORAL_TABLET | Freq: Every day | ORAL | 3 refills | Status: AC

## 2024-08-17 MED ORDER — ESTRADIOL 0.1 MG/GM VA CREA: TOPICAL_CREAM | VAGINAL | 3 refills | Status: AC

## 2024-08-17 NOTE — Patient Instructions (Addendum)
 Continue vaginal estrogen 0.5g 3 times a week.   Good job staying active and going to therapy.   Continue gemtesa  for your overactive bladder.

## 2024-08-17 NOTE — Assessment & Plan Note (Signed)
-   02/15/23 POCT UA negative, prior PVR WNL - started since husband got sick and menopause, doing well with emotional wellbeing since starting therapy, pelvic floor PT and staying active with art projects - We discussed the symptoms of overactive bladder (OAB), which include urinary urgency, urinary frequency, nocturia, with or without urge incontinence.  While we do not know the exact etiology of OAB, several treatment options exist. We discussed management including behavioral therapy (decreasing bladder irritants, urge suppression strategies, timed voids, bladder retraining), physical therapy, medication; for refractory cases posterior tibial nerve stimulation, sacral neuromodulation, and intravesical botulinum toxin injection.  For anticholinergic medications, we discussed the potential side effects of anticholinergics including dry eyes, dry mouth, constipation, cognitive impairment and urinary retention. For Beta-3 agonist medication, we discussed the potential side effect of elevated blood pressure which is more likely to occur in individuals with uncontrolled hypertension. - For treatment of stress urinary incontinence,  non-surgical options include expectant management, weight loss, physical therapy, as well as a pessary.  Surgical options include a midurethral sling, Burch urethropexy, and transurethral injection of a bulking agent. - continue low dose vaginal estrogen 0.5g 3x/week - encouraged fluid management and continue caffeine reduction to <10% if total intake, continue fiber supplementation to optimize bowel consistency - prefers gemtesa , continue daily - dislike mirabegron  with decreased relief - continue pelvic floor PT

## 2024-08-17 NOTE — Progress Notes (Signed)
 [facility] Return Visit  SUBJECTIVE  History of Present Illness: Barbara Munoz is a 73 y.o. female seen in follow-up for mixed urinary incontinence, vaginal atrophy, history of pelvic surgery, feeling of incomplete bladder emptying, nocturia, fecal incontinence, stage II pelvic organ prolapse. Plan at last visit was trial of mirabegron , reduce vaginal estrogen to 0.5g twice a week, consider sleep study, referral for pelvic floor PT.   Sister passed away since last visit and husband passed away 2023/09/02.  Undergoing counseling and reports support from her faith, reports feeling overall better Reports crafts and being active, dying fabric with colors and leaves from her yard Started estradiol  02/19/24 with left breast tenderness since prior to estrogen use with history of painful breast cysts, now using 1g1-2x/week. Desires to continue due to improvement in vaginal sensation at reduced at 0.5g 3x/week Reports initially started using miralax 02/27/24 with increased gas and sticky bowel movements with increased frequency. Discontinued miralax.  Started IBGuard with no change in symptoms and worsens heartburn. Reports bowel movements every 2 days without straining with squatting position and pelvic floor PT Increased walking Using gemtesa  ($128 for 90d) prefers over mirabegron  ($80). Started Loratadine with allergies.  Took imodium 04/16/24 Started psyllium fiber capsules 1 capsule every 2 days with improvement of bowel movement.  Fecal leakage resolved since starting fiber supplementation.  Fecal leakage episodes associated with urinary leakage. [facility]  Reports minimal nocturia symptoms, only when she has sleep quality Maintains alcohol intake to < 10%  Up-to-date with breast screening 12/29/23 CLINICAL DATA:  Screening.   EXAM: DIGITAL SCREENING BILATERAL MAMMOGRAM WITH TOMOSYNTHESIS AND CAD   TECHNIQUE: Bilateral screening digital craniocaudal  and mediolateral oblique mammograms were obtained. Bilateral screening digital breast tomosynthesis was performed. The images were evaluated with computer-aided detection.   COMPARISON:  Previous exam(s).   ACR Breast Density Category c: The breasts are heterogeneously dense, which may obscure small masses.   FINDINGS: There are no findings suspicious for malignancy.   IMPRESSION: No mammographic evidence of malignancy. A result letter of this screening mammogram will be mailed directly to the patient.   RECOMMENDATION: Screening mammogram in one year. (Code:[id number])   BI-RADS CATEGORY  1: Negative.     Electronically Signed   By: Toribio Agreste M.D.   On: 12/30/2023 12:03  Past Medical History: Patient  has a past medical history of Anxiety and depression (09/08/2011), Diverticulosis, Female bladder prolapse, GERD (gastroesophageal reflux disease) (09/05/2011), History of adenomatous polyp of colon (09/01/17), History of Clostridium difficile infection 2006/09/01), History of cyst of breast, Hyperlipidemia, Hypotension September 01, 2021), IFG (impaired fasting glucose) (06/2018), Insomnia (09/05/2011), Migraine, Orthostatic hypotension (11/20/2021), Overweight (BMI 25.0-29.9) (09/05/2011), Palpitations, Seasonal allergic rhinitis, Subclinical hypothyroidism, and Vertigo, benign positional (09/08/2011).   Past Surgical History: She  has a past surgical history that includes Tonsillectomy Sep 01, 1981); Tubal ligation (8016); vaginal hernia repair (05/2011); Cosmetic surgery; Posterior repair September 02, 2011); Colonoscopy (07/2006; 08/25/17); Cardiovascular stress test (02/15/2015); DEXA (06/19/09; 01/19/2019); Transvaginal ultrasound (10/2016); transthoracic echocardiogram (01/06/2022); Esophagogastroduodenoscopy; Refractive surgery (05/2022); Coronary calcium  score; and ZIO monitoring (12/2023).   Medications: She has a current medication list which includes the following prescription(s): alprazolam , atorvastatin ,  estradiol , fludrocortisone , fluticasone , loratadine, melatonin, meloxicam , midodrine , pantoprazole , sertraline , valacyclovir , and vibegron .   Allergies: Patient has no known allergies.   Social History: Patient  reports that she has never smoked. She has never used smokeless tobacco. She reports current alcohol use. She reports that she does not use drugs.     OBJECTIVE  Physical Exam: Vitals:   08/17/24 1029  BP: 121/79  Pulse: 75   Gen: No apparent distress, A&O x 3.  Detailed Urogynecologic Evaluation:  Deferred.    ASSESSMENT AND PLAN    Ms. Indelicato is a 73 y.o. with:  1. Vaginal atrophy   2. Urinary incontinence, mixed    Vaginal atrophy Assessment & Plan: - For symptomatic vaginal atrophy options include lubrication with a water-based lubricant, personal hygiene measures and barrier protection against wetness, and estrogen replacement in the form of vaginal cream, vaginal tablets, or a time-released vaginal ring.   - prior breast tenderness prior to starting vaginal estrogen, declines discontinuation due to relief - reduce dose to 0.5g three times a week without breast tenderness   Urinary incontinence, mixed Assessment & Plan: - 02/15/23 POCT UA negative, prior PVR WNL - started since husband got sick and menopause, doing well with emotional wellbeing since starting therapy, pelvic floor PT and staying active with art projects - We discussed the symptoms of overactive bladder (OAB), which include urinary urgency, urinary frequency, nocturia, with or without urge incontinence.  While we do not know the exact etiology of OAB, several treatment options exist. We discussed management including behavioral therapy (decreasing bladder irritants, urge suppression strategies, timed voids, bladder retraining), physical therapy, medication; for refractory cases posterior tibial nerve stimulation, sacral neuromodulation, and intravesical botulinum toxin injection.  For  anticholinergic medications, we discussed the potential side effects of anticholinergics including dry eyes, dry mouth, constipation, cognitive impairment and urinary retention. For Beta-3 agonist medication, we discussed the potential side effect of elevated blood pressure which is more likely to occur in individuals with uncontrolled hypertension. - For treatment of stress urinary incontinence,  non-surgical options include expectant management, weight loss, physical therapy, as well as a pessary.  Surgical options include a midurethral sling, Burch urethropexy, and transurethral injection of a bulking agent. - continue low dose vaginal estrogen 0.5g 3x/week - encouraged fluid management and continue caffeine reduction to <10% if total intake, continue fiber supplementation to optimize bowel consistency - prefers gemtesa , continue daily - dislike mirabegron  with decreased relief - continue pelvic floor PT    Time spent: I spent 24 minutes dedicated to the care of this patient on the date of this encounter to include pre-visit review of records, face-to-face time with the patient discussing mixed urinary incontinence, vaginal atrophy, and post visit documentation and ordering medication/referral.   Lianne ONEIDA Gillis, MD

## 2024-08-17 NOTE — Assessment & Plan Note (Signed)
-   For symptomatic vaginal atrophy options include lubrication with a water-based lubricant, personal hygiene measures and barrier protection against wetness, and estrogen replacement in the form of vaginal cream, vaginal tablets, or a time-released vaginal ring.   - prior breast tenderness prior to starting vaginal estrogen, declines discontinuation due to relief - reduce dose to 0.5g three times a week without breast tenderness

## 2024-09-01 ENCOUNTER — Encounter: Payer: Self-pay | Admitting: Family Medicine

## 2024-09-01 ENCOUNTER — Ambulatory Visit: Admitting: Family Medicine

## 2024-09-01 VITALS — BP 125/53 | HR 71 | Temp 97.8°F | Ht 64.0 in | Wt 158.6 lb

## 2024-09-01 DIAGNOSIS — F3341 Major depressive disorder, recurrent, in partial remission: Secondary | ICD-10-CM

## 2024-09-01 DIAGNOSIS — L719 Rosacea, unspecified: Secondary | ICD-10-CM

## 2024-09-01 DIAGNOSIS — B001 Herpesviral vesicular dermatitis: Secondary | ICD-10-CM

## 2024-09-01 DIAGNOSIS — E7800 Pure hypercholesterolemia, unspecified: Secondary | ICD-10-CM

## 2024-09-01 DIAGNOSIS — F411 Generalized anxiety disorder: Secondary | ICD-10-CM | POA: Diagnosis not present

## 2024-09-01 DIAGNOSIS — Z23 Encounter for immunization: Secondary | ICD-10-CM

## 2024-09-01 DIAGNOSIS — I950 Idiopathic hypotension: Secondary | ICD-10-CM | POA: Diagnosis not present

## 2024-09-01 LAB — COMPREHENSIVE METABOLIC PANEL WITH GFR
ALT: 20 U/L (ref 0–35)
AST: 23 U/L (ref 0–37)
Albumin: 4.5 g/dL (ref 3.5–5.2)
Alkaline Phosphatase: 52 U/L (ref 39–117)
BUN: 13 mg/dL (ref 6–23)
CO2: 30 meq/L (ref 19–32)
Calcium: 9.4 mg/dL (ref 8.4–10.5)
Chloride: 102 meq/L (ref 96–112)
Creatinine, Ser: 0.74 mg/dL (ref 0.40–1.20)
GFR: 80.62 mL/min (ref >60.00)
Glucose, Bld: 98 mg/dL (ref 70–99)
Potassium: 3.5 meq/L (ref 3.5–5.1)
Sodium: 140 meq/L (ref 135–145)
Total Bilirubin: 0.7 mg/dL (ref 0.2–1.2)
Total Protein: 7.1 g/dL (ref 6.0–8.3)

## 2024-09-01 LAB — LIPID PANEL
Cholesterol: 160 mg/dL (ref 0–200)
HDL: 47.9 mg/dL (ref >39.00)
LDL Cholesterol: 92 mg/dL (ref 0–99)
NonHDL: 111.66
Total CHOL/HDL Ratio: 3
Triglycerides: 99 mg/dL (ref 0.0–149.0)
VLDL: 19.8 mg/dL (ref 0.0–40.0)

## 2024-09-01 MED ORDER — VALACYCLOVIR HCL 1 G PO TABS
1000.0000 mg | ORAL_TABLET | Freq: Every day | ORAL | 1 refills | Status: AC
Start: 2024-09-01 — End: ?

## 2024-09-01 MED ORDER — ALPRAZOLAM 0.25 MG PO TABS: 0.2500 mg | ORAL_TABLET | Freq: Two times a day (BID) | ORAL | 5 refills | Status: AC | PRN

## 2024-09-01 MED ORDER — MIDODRINE HCL 5 MG PO TABS: 5.0000 mg | ORAL_TABLET | Freq: Three times a day (TID) | ORAL | 1 refills | Status: AC

## 2024-09-01 MED ORDER — METRONIDAZOLE 0.75 % EX LOTN: 1.0000 | TOPICAL_LOTION | Freq: Every day | CUTANEOUS | 0 refills | Status: AC

## 2024-09-01 NOTE — Progress Notes (Signed)
 OFFICE VISIT  09/01/2024  CC: No chief complaint on file.   Patient is a 73 y.o. female who presents for 53-month follow-up idiopathic hypotension, hypercholesterolemia, and anxiety and depression. A/P as of last visit: 1. Idiopathic hypotension. Stable on Florinef  0.1 mg and midodrine  5 mg 3 times a day. Basic metabolic panel today.   2.  Hypercholesterolemia. Doing well on a atorvastatin  20 mg a day. Lipid panel and hepatic panel today.   3.  GAD and recurrent major depressive disorder. Overall pretty stable.  Continue sertraline  200 mg a day and Xanax  0.25 mg, 1 tab twice daily as needed.  INTERIM HX: All labs excellent last visit.  She is doing well regarding blood pressure.  Rarely ever has dizziness.  She says each episode can be linked to dehydration.  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 She has been to a counselor 4 times and does not really feel like she has learned anything helpful.    PMP AWARE reviewed today: most recent rx for alprazolam  was filled 06/28/2024, # 60, rx by me. No red flags.  ROS as above, plus--> no fevers, no CP, no SOB, no wheezing, no cough, no HAs, no rashes, no melena/hematochezia.  No polyuria or polydipsia.  No myalgias or arthralgias.  No focal weakness, paresthesias, or tremors.  No acute vision or hearing abnormalities.  No dysuria or unusual/new urinary urgency or frequency.  No recent changes in lower legs. No n/v/d or abd pain.  No palpitations.    Past Medical History:  Diagnosis Date   Anxiety and depression 09/08/2011   Diverticulosis    12/2022 colonoscopy   Female bladder prolapse    GERD (gastroesophageal reflux disease) 09/05/2011   Improved signif with PPI   History of adenomatous polyp of colon 08/2017   History of Clostridium difficile infection 2007   colonic changes c/w with this dx seen on colonoscopy 2007---sx's responded to flagyl .   History of cyst of breast    multiple drained  during menopause   Hyperlipidemia    Hypotension 2022   ?idiopathic   IFG (impaired fasting glucose) 06/2018   Fastings 105-115 range when pt checked with husband's glucometer.  Fasting gluc here was 98 and A1c 5.6% 07/01/18.   Insomnia 09/05/2011   Migraine    worst during menopause   Orthostatic hypotension 11/20/2021   Overweight (BMI 25.0-29.9) 09/05/2011   Palpitations    rare PACs and PVCs on monitoring 12/2023   Seasonal allergic rhinitis    Subclinical hypothyroidism    Vertigo, benign positional 09/08/2011    Past Surgical History:  Procedure Laterality Date   CARDIOVASCULAR STRESS TEST  02/15/2015   Exercise myocard perfusion testing: Normal EF, normal wall motion, normal perfusion.  Poor exercise capacity (4 min).   COLONOSCOPY  07/2006; 08/25/17   2007: pseudomembranous colitis, o/w normal. (Dr. Teressa).  08/2017: adenomas.  12/2022 adenomas.   Coronary calcium  score     2024 ZERO   COSMETIC SURGERY     DEXA  06/19/09; 01/19/2019   Normal 2010, 2020, 2024   ESOPHAGOGASTRODUODENOSCOPY     01/02/23, normal   POSTERIOR REPAIR  2012   REFRACTIVE SURGERY  05/2022   TONSILLECTOMY  1982   TRANSTHORACIC ECHOCARDIOGRAM  01/06/2022   NORMAL   Transvaginal ultrasound  10/2016   Normal (done for LLQ pain by Dr. Criselda).   TUBAL LIGATION  1983   vaginal hernia repair  05/2011   rectocele repair  ZIO monitoring  12/2023   rare PACs and PVCs    Outpatient Medications Prior to Visit  Medication Sig Dispense Refill   ALPRAZolam  (XANAX ) 0.25 MG tablet TAKE 1 TABLET BY MOUTH TWICE A DAY AS NEEDED 60 tablet 5   atorvastatin  (LIPITOR) 20 MG tablet Take 1 tablet (20 mg total) by mouth daily. 90 tablet 3   estradiol  (ESTRACE ) 0.1 MG/GM vaginal cream Place 0.5-1g nightly for two weeks then twice a week after 42.5 each 3   fludrocortisone  (FLORINEF ) 0.1 MG tablet TAKE 1 TABLET BY MOUTH EVERY DAY 90 tablet 3   fluticasone  (FLONASE ) 50 MCG/ACT nasal spray SPRAY 2 SPRAYS INTO EACH  NOSTRIL EVERY DAY 16 g 2   loratadine (CLARITIN) 10 MG tablet Take 10 mg by mouth daily as needed. Spring and fall     Melatonin 5 MG CAPS Take 5 mg by mouth as needed.     meloxicam  (MOBIC ) 7.5 MG tablet TAKE 1 TO 2 TABLETS EVERY DAY AS NEEDED FOR MUSCULOSKELETAL PAIN 60 tablet 2   pantoprazole  (PROTONIX ) 40 MG tablet Take 1 tablet (40 mg total) by mouth daily as needed. 90 tablet 3   sertraline  (ZOLOFT ) 100 MG tablet Take 2 tablets (200 mg total) by mouth at bedtime. 180 tablet 3   Vibegron  75 MG TABS Take 1 tablet (75 mg total) by mouth daily. 90 tablet 3   valACYclovir  (VALTREX ) 1000 MG tablet Take 1 tablet (1,000 mg total) by mouth daily. 10 tablet 1   midodrine  (PROAMATINE ) 5 MG tablet TAKE 1 TABLET (5 MG TOTAL) BY MOUTH 3 (THREE) TIMES DAILY WITH MEALS. 270 tablet 1   No facility-administered medications prior to visit.    No Known Allergies  Review of Systems As per HPI  PE:    09/01/2024    9:48 AM 08/17/2024   10:29 AM 05/17/2024   10:37 AM  Vitals with BMI  Height 5' 4    Weight 158 lbs 10 oz    BMI 27.21    Systolic 125 121 864  Diastolic 53 79 79  Pulse 71 75 77     Physical Exam  Gen: Alert, well appearing.  Patient is oriented to person, place, time, and situation. AFFECT: pleasant, lucid thought and speech. Tearful at times. No further exam today  LABS:  Last CBC Lab Results  Component Value Date   WBC 5.9 02/29/2024   HGB 14.4 02/29/2024   HCT 43.1 02/29/2024   MCV 93.5 02/29/2024   MCH 31.8 05/17/2021   RDW 14.1 02/29/2024   PLT 155.0 02/29/2024   Last metabolic panel Lab Results  Component Value Date   GLUCOSE 91 02/29/2024   NA 142 02/29/2024   K 4.2 02/29/2024   CL 103 02/29/2024   CO2 31 02/29/2024   BUN 13 02/29/2024   CREATININE 0.62 02/29/2024   GFR 89.05 02/29/2024   CALCIUM  9.5 02/29/2024   PHOS 3.0 11/09/2012   PROT 7.1 02/29/2024   ALBUMIN 4.5 02/29/2024   BILITOT 0.6 02/29/2024   ALKPHOS 61 02/29/2024   AST 27  02/29/2024   ALT 26 02/29/2024   ANIONGAP 7 05/17/2021   Last lipids Lab Results  Component Value Date   CHOL 177 02/29/2024   HDL 47.60 02/29/2024   LDLCALC 101 (H) 02/29/2024   LDLDIRECT 110.6 10/26/2013   TRIG 141.0 02/29/2024   CHOLHDL 4 02/29/2024   Last hemoglobin A1c Lab Results  Component Value Date   HGBA1C 5.4 11/08/2019   Last thyroid  functions Lab  Results  Component Value Date   TSH 2.96 02/26/2023   T3TOTAL 112.4 06/30/2017   THYROIDAB 507 (H) 05/28/2017   Last vitamin B12 and Folate Lab Results  Component Value Date   VITAMINB12 455 02/26/2023   IMPRESSION AND PLAN:  #1 anxiety and depression. She is continuing to work through things. We discussed a mindfulness exercise today. I gave general encouragement. Continue sertraline  200 mg a day and alprazolam  0.25 mg twice daily as needed.  New prescription for alprazolam  today, #60, refill x 5.  #2 idiopathic hypotension. She has been stable on midodrine  5 mg 3 times daily and Florinef  0.1 mg daily. Monitor electrolytes and creatinine today.  3.  Hypercholesterolemia, doing well long-term on Lipitor 20 mg a day. Lipid panel and hepatic panel today.  An After Visit Summary was printed and given to the patient.  FOLLOW UP: Return in about 6 months (around 03/01/2025) for annual CPE (fasting).  Signed:  Gerlene Hockey, MD           09/01/2024

## 2024-09-02 ENCOUNTER — Ambulatory Visit: Payer: Self-pay | Admitting: Family Medicine

## 2024-09-05 ENCOUNTER — Ambulatory Visit: Admitting: Licensed Clinical Social Worker

## 2024-09-05 DIAGNOSIS — F32A Depression, unspecified: Secondary | ICD-10-CM

## 2024-09-05 DIAGNOSIS — F419 Anxiety disorder, unspecified: Secondary | ICD-10-CM

## 2024-09-05 NOTE — Progress Notes (Addendum)
 [facility] Counselor/Therapist Progress Note  Patient ID: Barbara Munoz, MRN: [id number]    Date: 09/05/24  Time Spent: 0904  am - 1000 am : 56 Minutes  Treatment Type: Individual Therapy.  Reported Symptoms: Patient lost her spouse September 18th of 2024. She has a sister that she is close to has been in hospice for 2 weeks and now she has passed away. She has a history of depression anxiety. Patient reports that she has a habit of wearing herself thin and being retired she has more insight into her behavior. Patient reports that her mother and sister were both hospitalized in April at two different hospitals.    Mental Status Exam: Appearance:  Casual     Behavior: Appropriate  Motor: Normal  Speech/Language:  Clear and Coherent  Affect: Flat  Mood: depressed  Thought process: normal  Thought content:   WNL  Sensory/Perceptual disturbances:   WNL  Orientation: oriented to person, place, time/date, situation, day of week, month of year, and year  Attention: Good  Concentration: Good  Memory: WNL  Fund of knowledge:  Good  Insight:   Good  Judgment:  Good  Impulse Control: Good    Risk Assessment: Danger to Self:  No Self-injurious Behavior: No Danger to Others: No Duty to Warn:no Physical Aggression / Violence:No  Access to Firearms a concern: No  Gang Involvement:No    Subjective:    Barbara Munoz participated in person from office, located at [employer].  Barbara Munoz consented to treatment. Therapist participated in person from [place] office.    Barbara Munoz presented for her session in a positive mood. She reports that she has been doing better. She reports that she is working on being able to relax. She reports that she often finds it difficult to rest and not be busy. Barbara Munoz reports that her deceased husband often had her relax and would insist that she stop what she is doing and relax. Barbara Munoz reports that she has remained busy since his passing and  doesn't take much time for rest. She reports that she has found that she does enjoy doing her leaf crafting and it is relaxing but not the same as just sitting and being and enjoying the environment. She reports that she and her husband had a sitting area [place] and she finally went out there the other day and just sat in the calm. She reports that she found it to be comforting and peaceful. Barbara Munoz reports a desire to work on mindfulness and find ways to be mindful of the good in her life and enjoy being in the moment.  Clinician actively listened and provided support and feedback related to patient concerns. Clinician processed the importance of rest and and taking time to refuel emotionally and physically. Clinician processed ideas for mindfulness including:   Sensory Activities: Focus on your breath: Notice the rise and fall of your breath as you inhale and exhale.  Body scan: Slowly scan your body from head to toe, paying attention to sensations such as warmth, coolness, or tingling.  Listen to sounds: Close your eyes and listen to the sounds around you, such as birdsong, traffic, or your own heartbeat.  Smell something: Focus on the scent of a flower, candle, or other object.  Taste something: Pay attention to the flavors, textures, and smells of your food.  Movement Activities: Mindful walking: Take a slow walk, noticing the sensations of your feet on the ground and the sights and sounds around  you.  Yoga or tai chi: Engage in gentle movements that focus on your body and breath.  Dance: Allow yourself to move freely and express yourself through dance.  Creative Activities: Journaling: Write down your thoughts and feelings without judgment.  Drawing or painting: Focus on the process of creating art, rather than the outcome.  Playing with a pet: Pay attention to the sensations of petting your animal and their interactions with you.  Other Activities: Mindful meditation: Sit in a  comfortable position and focus on your breath or a mantra.  Spend time in nature: Observe the sights, sounds, and smells of the natural world.  Engage in hobbies: Focus on activities that require your attention and concentration, such as reading, gardening, or cooking.  Tips: Start with short periods of mindfulness practice and gradually increase the duration as you become more comfortable. Find a quiet and comfortable space where you can focus. Accept any distractions that arise and gently bring your attention back to the present moment. Practice regularly for the best results.  Clinician provided patient with a list of the ideas for mindfulness.  Jaleeah was fully engaged in session discussion. She was pleasant and cooperative. Barbara Munoz was motivated for treatment as evidenced by her setting goals and working toward those goals. Barbara Munoz will continue to work on improving and implementing mindfulness skills into her daily life. Patient is to use CBT, mindfulness and coping skills to help manage decrease symptoms associated with their diagnosis. Treatment planning to be reviewed by 06/08/2025.   Interventions: Cognitive Behavioral Therapy, Dialectical Behavioral Therapy, Mindfulness Meditation, and Solution-Oriented/Positive Psychology  Diagnosis: Anxiety and Depression   Damien Junk MSW, LCSW/DATE 09/05/2024

## 2024-09-06 ENCOUNTER — Encounter: Admitting: Physical Therapy

## 2024-09-07 ENCOUNTER — Other Ambulatory Visit: Payer: Self-pay | Admitting: Family Medicine

## 2024-09-13 ENCOUNTER — Encounter: Admitting: Physical Therapy

## 2024-09-15 ENCOUNTER — Ambulatory Visit: Attending: Obstetrics | Admitting: Physical Therapy

## 2024-09-15 DIAGNOSIS — R279 Unspecified lack of coordination: Secondary | ICD-10-CM | POA: Insufficient documentation

## 2024-09-15 DIAGNOSIS — M6281 Muscle weakness (generalized): Secondary | ICD-10-CM | POA: Diagnosis not present

## 2024-09-15 DIAGNOSIS — R293 Abnormal posture: Secondary | ICD-10-CM | POA: Diagnosis not present

## 2024-09-15 NOTE — Therapy (Signed)
 OUTPATIENT PHYSICAL THERAPY FEMALE PELVIC TREATMENT   Patient Name: Barbara Munoz MRN: [id number] DOB:Apr 17, 1951, 73 y.o., female Today's Date: 09/15/2024  END OF SESSION:  PT End of Session - 09/15/24 0850     Visit Number 2    Date for Recertification  10/28/24    Authorization Type [employer]    Authorization Time Period awaiting auth    Authorization - Visit Number 2    PT Start Time 0848    PT Stop Time 0929    PT Time Calculation (min) 41 min    Activity Tolerance Patient tolerated treatment well    Behavior During Therapy [facility] for tasks assessed/performed          Past Medical History:  Diagnosis Date   Anxiety and depression 09/08/2011   Diverticulosis    12/2022 colonoscopy   Female bladder prolapse    GERD (gastroesophageal reflux disease) 09/05/2011   Improved signif with PPI   History of adenomatous polyp of colon 08/2017   History of Clostridium difficile infection 2007   colonic changes c/w with this dx seen on colonoscopy 2007---sx's responded to flagyl .   History of cyst of breast    multiple drained during menopause   Hyperlipidemia    Hypotension 2022   ?idiopathic   IFG (impaired fasting glucose) 06/2018   Fastings 105-115 range when pt checked with husband's glucometer.  Fasting gluc here was 98 and A1c 5.6% 07/01/18.   Insomnia 09/05/2011   Migraine    worst during menopause   Orthostatic hypotension 11/20/2021   Overweight (BMI 25.0-29.9) 09/05/2011   Palpitations    rare PACs and PVCs on monitoring 12/2023   Seasonal allergic rhinitis    Subclinical hypothyroidism    Vertigo, benign positional 09/08/2011   Past Surgical History:  Procedure Laterality Date   CARDIOVASCULAR STRESS TEST  02/15/2015   Exercise myocard perfusion testing: Normal EF, normal wall motion, normal perfusion.  Poor exercise capacity (4 min).   COLONOSCOPY  07/2006; 08/25/17   2007: pseudomembranous colitis, o/w normal. (Dr. Teressa).  08/2017: adenomas.  12/2022 adenomas.    Coronary calcium  score     2024 ZERO   COSMETIC SURGERY     DEXA  06/19/09; 01/19/2019   Normal 2010, 2020, 2024   ESOPHAGOGASTRODUODENOSCOPY     01/02/23, normal   POSTERIOR REPAIR  2012   REFRACTIVE SURGERY  05/2022   TONSILLECTOMY  1982   TRANSTHORACIC ECHOCARDIOGRAM  01/06/2022   NORMAL   Transvaginal ultrasound  10/2016   Normal (done for LLQ pain by Dr. Criselda).   TUBAL LIGATION  1983   vaginal hernia repair  05/2011   rectocele repair   ZIO monitoring  12/2023   rare PACs and PVCs   Patient Active Problem List   Diagnosis Date Noted   Breast tenderness 05/17/2024   Urinary incontinence, mixed 02/16/2024   Vaginal atrophy 02/16/2024   History of pelvic surgery 02/16/2024   Nocturia 02/16/2024   Orthostatic hypotension 11/20/2021   Breast lump 11/07/2019   Maxillary sinusitis 08/08/2015   Interdigital neuroma 11/06/2014   Health maintenance examination 11/03/2013   Preventative health care 11/09/2012   Anxiety and depression 09/08/2011   Pelvic organ prolapse quantification stage 2 cystocele    GERD (gastroesophageal reflux disease) 09/05/2011   Overweight (BMI 25.0-29.9) 09/05/2011   Insomnia 09/05/2011   Hyperlipidemia    Migraine     PCP: Candise Aleene DEL, MD   REFERRING PROVIDER: Guadlupe Lianne DASEN, MD   REFERRING DIAG: R15.9 (ICD-10-CM) -  Incontinence of feces, unspecified fecal incontinence type R35.1 (ICD-10-CM) - Nocturia N39.46 (ICD-10-CM) - Urinary incontinence, mixed  THERAPY DIAG:  Muscle weakness (generalized)  Unspecified lack of coordination  Abnormal posture  Rationale for Evaluation and Treatment: Rehabilitation  ONSET DATE: 12 years  SUBJECTIVE:                                                                                                                                                                                           SUBJECTIVE STATEMENT: Reports bowel movement are getting better, much less constipated.  No fecal  incontinence. Emptying every other day without straining. But does need to empty 2-3x daily to fully empty.  Urinary incontinence - is trying to not urinate just in case, is going longer between voids now. Slight leakage still occurring. Is having totally dry days sometimes. About 1 pad per day but not consistently using, seldomly getting up to urinate now.  No longer having any pressure or dropping sensation vaginally or rectally.    Fluid intake: water - 30oz; 30oz of coffees/teas/sodas  PAIN:  Are you having pain? No   PRECAUTIONS: None  RED FLAGS: None   WEIGHT BEARING RESTRICTIONS: No  FALLS:  Has patient fallen in last 6 months? No  OCCUPATION: retired  ACTIVITY LEVEL : low  PLOF: Independent  PATIENT GOALS: to get stronger overall, regular bowel movements, no leakage  PERTINENT HISTORY:  Depression, Diverticulosis, Female bladder prolapse, Clostridium difficile infection, rectocele repair, Vaginal atrophy, Orthostatic hypotension, 2 cystocele  Sexual abuse: No  BOWEL MOVEMENT: Pain with bowel movement: No Type of bowel movement:Type (Bristol Stool Scale) varies from 2-6 but right now usually 2-3, Frequency every 3rd day, and Strain not often Fully empty rectum: No Leakage: Yes: smearing when thinks she is passing gas sometimes mucus Pads: No Fiber supplement/laxative Yes attempted miralax   URINATION: Pain with urination: No Fully empty bladder: No Stream: Weak Urgency: Yes  Frequency: not quicker than every 2 hours during the day, does do just in case; 1x nightly  Leakage: Urge to void and Walking to the bathroom Pads: No  INTERCOURSE:  Ability to have vaginal penetration No  Pain with intercourse: none   PREGNANCY: Vaginal deliveries 2 Episiotomy Yes  C-section deliveries 0 Currently pregnant No  PROLAPSE: Pressure felt vaginally and did have rectocele repaired previously   OBJECTIVE:  Note: Objective measures were completed at Evaluation  unless otherwise noted.  DIAGNOSTIC FINDINGS:    PATIENT SURVEYS:    PFIQ-7: 64  COGNITION: Overall cognitive status: Within functional limits for tasks assessed     SENSATION: Light touch: Appears intact  LUMBAR SPECIAL TESTS:  Single leg stance test: pelvic instability noted bil  FUNCTIONAL TESTS:  Functional squat limited in descent bu 25% and bil knee valgus   GAIT: WFL  POSTURE: rounded shoulders, forward head, and posterior pelvic tilt   LUMBARAROM/PROM:  A/PROM A/PROM  eval  Flexion Limited by 25%  Extension WFL  Right lateral flexion Limited by 25%  Left lateral flexion Limited by 25%  Right rotation WFL  Left rotation WFL   (Blank rows = not tested)  LOWER EXTREMITY ROM:  Bil hamstrings and adductors limited by 25%  LOWER EXTREMITY MMT:  Bil hips grossly 4/5 PALPATION:   General: no TTP but tight bil lumbar paraspinals and gluteals   Pelvic Alignment: WFL  Abdominal: no TTP but mild fascial restrictions in all quadrants                 External Perineal Exam: [facility]                             Internal Pelvic Floor: tightness on Rt superficial and deep layer with TTP  Patient confirms identification and approves PT to assess internal pelvic floor and treatment Yes No emotional/communication barriers or cognitive limitation. Patient is motivated to learn. Patient understands and agrees with treatment goals and plan. PT explains patient will be examined in standing, sitting, and lying down to see how their muscles and joints work. When they are ready, they will be asked to remove their underwear so PT can examine their perineum. The patient is also given the option of providing their own chaperone as one is not provided in our facility. The patient also has the right and is explained the right to defer or refuse any part of the evaluation or treatment including the internal exam. With the patient's consent, PT will use one gloved finger to gently assess  the muscles of the pelvic floor, seeing how well it contracts and relaxes and if there is muscle symmetry. After, the patient will get dressed and PT and patient will discuss exam findings and plan of care. PT and patient discuss plan of care, schedule, attendance policy and HEP activities.  PELVIC MMT:   MMT eval  Vaginal 3/5, 9s, 4 reps  Internal Anal Sphincter   External Anal Sphincter   Puborectalis   Diastasis Recti   (Blank rows = not tested)        TONE: Increased at Rt side  PROLAPSE: Not seen ant/post in hooklying with coughs  TODAY'S TREATMENT:                                                                                                                              DATE:   07/28/24 EVAL Examination completed, findings reviewed, pt educated on POC, HEP, and voiding mechanics and urge drill. Pt motivated to participate in PT and agreeable to attempt recommendations.    09/15/24: Voiding mechanics educated on with  abdominal massage and step stool use (pt reports she does use stool) Hooklying 2x10 diaphragmatic breathing with pelvic floor activation and lengthening - reports inconsistent awareness of tightening pelvic floor Hooklying ball squeezes (adduction) 2x10 with pelvic floor activation and exhale - greatly improved awareness and feeling of pelvic floor tightening> also attempted x10 hand ball squeezes and had greatly improved coordination of transverse abdominis activation and awareness of pelvic floor contraction 2x10 quick flicks with external counting for improved pacing.  2x10 blue band alternating marching + pelvic floor contraction+exhale 2x10 blue band hip abduction + pelvic floor activation+exhale 2x10 opposite hand/knee ball press +pelvic floor activation+exhale    PATIENT EDUCATION:  Education details: Psychologist, sport and exercise Person educated: Patient Education method: Programmer, multimedia, Demonstration, Tactile cues, Verbal cues, and Handouts Education comprehension: verbalized  understanding, returned demonstration, verbal cues required, tactile cues required, and needs further education  HOME EXERCISE PROGRAM: [id number]  ASSESSMENT:  CLINICAL IMPRESSION: Patient is a 73 y.o. female  who was seen today for physical therapy treatment for urine and fecal incontinence and increased urinary frequency. Pt educated to attempt to be more consistent with abdominal massage for improved fully emptying. updated and educated on HEP. Tolerated session well, but does need single step cues and consistent verbal cues and visual demonstration for coordination of pelvic floor and exercise and breathing. Pt would benefit from additional PT to further address deficits.    OBJECTIVE IMPAIRMENTS: decreased activity tolerance, decreased coordination, decreased endurance, decreased mobility, decreased strength, increased fascial restrictions, increased muscle spasms, impaired flexibility, improper body mechanics, postural dysfunction, and pain.   ACTIVITY LIMITATIONS: continence  PARTICIPATION LIMITATIONS: community activity  PERSONAL FACTORS: Time since onset of injury/illness/exacerbation and 1 comorbidity: medical history are also affecting patient's functional outcome.   REHAB POTENTIAL: Good  CLINICAL DECISION MAKING: Stable/uncomplicated  EVALUATION COMPLEXITY: Low   GOALS: Goals reviewed with patient? Yes  SHORT TERM GOALS: Target date: 08/25/24  Pt to be I with HEP for carry over and continuing recommendations for improved outcomes.   Baseline: Goal status: INITIAL  2.  Pt will be independent with the knack, urge suppression technique, and double voiding in order to improve bladder habits and decrease urinary incontinence.   Baseline:  Goal status: INITIAL  3.  Pt will be independent with use of squatty potty, relaxed toileting mechanics, and improved bowel movement techniques in order to increase ease of bowel movements and complete evacuation.   Baseline:  Goal  status: INITIAL  4.  Pt to be I with pressure management with lifting 5# without feeling pressure vaginally or rectally without compensatory strategies to decreased strain at pelvic floor.  Baseline:  Goal status: INITIAL   LONG TERM GOALS: Target date: 10/28/24  Pt to be I with advanced HEP for carry over and continuing recommendations for improved outcomes.   Baseline:  Goal status: INITIAL  2.  Pt to demonstrate improved coordination of pelvic floor and breathing mechanics with 10# squat with appropriate synergistic patterns to decrease pain and leakage at least 75% of the time for improved ability to complete a 30 minute walk without strain at pelvic floor and symptoms.    Baseline:  Goal status: INITIAL  3.  Pt to report no fecal leakage in at least 1 month due to improved pelvic floor strength and coordination for improved confidence with community outings.  Baseline:  Goal status: INITIAL  4.  Pt to report no urinary leakage in at least 1 week for improved confidence with community outings.  Baseline:  Goal status:  INITIAL   PLAN:  PT FREQUENCY: 1x/week  PT DURATION: 8 sessions  PLANNED INTERVENTIONS: 97110-Therapeutic exercises, 97530- Therapeutic activity, 97112- Neuromuscular re-education, 97535- Self Care, 02859- Manual therapy, [phone]- Canalith repositioning, [id number]- Aquatic Therapy, [phone] (1-2 muscles), [id number] (3+ muscles)- Dry Needling, Patient/Family education, Taping, Joint mobilization, Spinal mobilization, Scar mobilization, DME instructions, Cryotherapy, Moist heat, and Biofeedback  PLAN FOR NEXT SESSION: coordination of pelvic floor with core and hip strengthening   Darryle Navy, PT, DPT 09/15/2509:03 AM  [facility] [address] [place], [place] [address] Phone # [phone] Fax [phone]

## 2024-09-20 ENCOUNTER — Encounter: Admitting: Physical Therapy

## 2024-09-22 ENCOUNTER — Ambulatory Visit: Attending: Obstetrics | Admitting: Physical Therapy

## 2024-09-22 ENCOUNTER — Encounter: Payer: Self-pay | Admitting: Physical Therapy

## 2024-09-22 DIAGNOSIS — R279 Unspecified lack of coordination: Secondary | ICD-10-CM | POA: Insufficient documentation

## 2024-09-22 DIAGNOSIS — R293 Abnormal posture: Secondary | ICD-10-CM | POA: Diagnosis not present

## 2024-09-22 DIAGNOSIS — M6281 Muscle weakness (generalized): Secondary | ICD-10-CM | POA: Diagnosis not present

## 2024-09-22 NOTE — Therapy (Signed)
 OUTPATIENT PHYSICAL THERAPY FEMALE PELVIC TREATMENT   Patient Name: Barbara Munoz MRN: [id number] DOB:19-Sep-1951, 73 y.o., female Today's Date: 09/22/2024  END OF SESSION:  PT End of Session - 09/22/24 0844     Visit Number 3    Date for Recertification  10/28/24    Authorization Type [employer]    Authorization Time Period Cohere Approved 8 visits-07/28/24-10/28/24-auth#[id number]    Authorization - Visit Number 3    PT Start Time 0846    PT Stop Time 0928    PT Time Calculation (min) 42 min    Activity Tolerance Patient tolerated treatment well    Behavior During Therapy [facility] for tasks assessed/performed          Past Medical History:  Diagnosis Date   Anxiety and depression 09/08/2011   Diverticulosis    12/2022 colonoscopy   Female bladder prolapse    GERD (gastroesophageal reflux disease) 09/05/2011   Improved signif with PPI   History of adenomatous polyp of colon 08/2017   History of Clostridium difficile infection 2007   colonic changes c/w with this dx seen on colonoscopy 2007---sx's responded to flagyl .   History of cyst of breast    multiple drained during menopause   Hyperlipidemia    Hypotension 2022   ?idiopathic   IFG (impaired fasting glucose) 06/2018   Fastings 105-115 range when pt checked with husband's glucometer.  Fasting gluc here was 98 and A1c 5.6% 07/01/18.   Insomnia 09/05/2011   Migraine    worst during menopause   Orthostatic hypotension 11/20/2021   Overweight (BMI 25.0-29.9) 09/05/2011   Palpitations    rare PACs and PVCs on monitoring 12/2023   Seasonal allergic rhinitis    Subclinical hypothyroidism    Vertigo, benign positional 09/08/2011   Past Surgical History:  Procedure Laterality Date   CARDIOVASCULAR STRESS TEST  02/15/2015   Exercise myocard perfusion testing: Normal EF, normal wall motion, normal perfusion.  Poor exercise capacity (4 min).   COLONOSCOPY  07/2006; 08/25/17   2007: pseudomembranous colitis, o/w normal. (Dr.  Teressa).  08/2017: adenomas.  12/2022 adenomas.   Coronary calcium  score     2024 ZERO   COSMETIC SURGERY     DEXA  06/19/09; 01/19/2019   Normal 2010, 2020, 2024   ESOPHAGOGASTRODUODENOSCOPY     01/02/23, normal   POSTERIOR REPAIR  2012   REFRACTIVE SURGERY  05/2022   TONSILLECTOMY  1982   TRANSTHORACIC ECHOCARDIOGRAM  01/06/2022   NORMAL   Transvaginal ultrasound  10/2016   Normal (done for LLQ pain by Dr. Criselda).   TUBAL LIGATION  1983   vaginal hernia repair  05/2011   rectocele repair   ZIO monitoring  12/2023   rare PACs and PVCs   Patient Active Problem List   Diagnosis Date Noted   Breast tenderness 05/17/2024   Urinary incontinence, mixed 02/16/2024   Vaginal atrophy 02/16/2024   History of pelvic surgery 02/16/2024   Nocturia 02/16/2024   Orthostatic hypotension 11/20/2021   Breast lump 11/07/2019   Maxillary sinusitis 08/08/2015   Interdigital neuroma 11/06/2014   Health maintenance examination 11/03/2013   Preventative health care 11/09/2012   Anxiety and depression 09/08/2011   Pelvic organ prolapse quantification stage 2 cystocele    GERD (gastroesophageal reflux disease) 09/05/2011   Overweight (BMI 25.0-29.9) 09/05/2011   Insomnia 09/05/2011   Hyperlipidemia    Migraine     PCP: Candise Aleene DEL, MD   REFERRING PROVIDER: Guadlupe Lianne DASEN, MD   REFERRING DIAG: R15.9 (  ICD-10-CM) - Incontinence of feces, unspecified fecal incontinence type R35.1 (ICD-10-CM) - Nocturia N39.46 (ICD-10-CM) - Urinary incontinence, mixed  THERAPY DIAG:  Muscle weakness (generalized)  Unspecified lack of coordination  Abnormal posture  Rationale for Evaluation and Treatment: Rehabilitation  ONSET DATE: 12 years  SUBJECTIVE:                                                                                                                                                                                           SUBJECTIVE STATEMENT: Pt reports she does still have  trouble with quick flicks and holds but other exercises doing well. Leakage has been very minimal. Does still note strong urgency after meals as she does drink a lot with food. Not having issues with stressors.  Did have one instance of fecal urgency and needed to stop before getting to an appointment to use restroom. No accident but did have strong urgency.  Does feel like gas is still happening but not as bad.    Fluid intake: water - 30oz; 30oz of coffees/teas/sodas  PAIN:  Are you having pain? No   PRECAUTIONS: None  RED FLAGS: None   WEIGHT BEARING RESTRICTIONS: No  FALLS:  Has patient fallen in last 6 months? No  OCCUPATION: retired  ACTIVITY LEVEL : low  PLOF: Independent  PATIENT GOALS: to get stronger overall, regular bowel movements, no leakage  PERTINENT HISTORY:  Depression, Diverticulosis, Female bladder prolapse, Clostridium difficile infection, rectocele repair, Vaginal atrophy, Orthostatic hypotension, 2 cystocele  Sexual abuse: No  BOWEL MOVEMENT: Pain with bowel movement: No Type of bowel movement:Type ([place] Stool Scale) varies from 2-6 but right now usually 2-3, Frequency every 3rd day, and Strain not often Fully empty rectum: No Leakage: Yes: smearing when thinks she is passing gas sometimes mucus Pads: No Fiber supplement/laxative Yes attempted miralax   URINATION: Pain with urination: No Fully empty bladder: No Stream: Weak Urgency: Yes  Frequency: not quicker than every 2 hours during the day, does do just in case; 1x nightly  Leakage: Urge to void and Walking to the bathroom Pads: No  INTERCOURSE:  Ability to have vaginal penetration No  Pain with intercourse: none   PREGNANCY: Vaginal deliveries 2 Episiotomy Yes  C-section deliveries 0 Currently pregnant No  PROLAPSE: Pressure felt vaginally and did have rectocele repaired previously   OBJECTIVE:  Note: Objective measures were completed at Evaluation unless otherwise  noted.  DIAGNOSTIC FINDINGS:    PATIENT SURVEYS:    PFIQ-7: 74  COGNITION: Overall cognitive status: Within functional limits for tasks assessed     SENSATION: Light touch: Appears intact  LUMBAR SPECIAL TESTS:  Single leg stance test: pelvic instability noted bil  FUNCTIONAL TESTS:  Functional squat limited in descent bu 25% and bil knee valgus   GAIT: WFL  POSTURE: rounded shoulders, forward head, and posterior pelvic tilt   LUMBARAROM/PROM:  A/PROM A/PROM  eval  Flexion Limited by 25%  Extension WFL  Right lateral flexion Limited by 25%  Left lateral flexion Limited by 25%  Right rotation WFL  Left rotation WFL   (Blank rows = not tested)  LOWER EXTREMITY ROM:  Bil hamstrings and adductors limited by 25%  LOWER EXTREMITY MMT:  Bil hips grossly 4/5 PALPATION:   General: no TTP but tight bil lumbar paraspinals and gluteals   Pelvic Alignment: WFL  Abdominal: no TTP but mild fascial restrictions in all quadrants                 External Perineal Exam: [facility]                             Internal Pelvic Floor: tightness on Rt superficial and deep layer with TTP  Patient confirms identification and approves PT to assess internal pelvic floor and treatment Yes No emotional/communication barriers or cognitive limitation. Patient is motivated to learn. Patient understands and agrees with treatment goals and plan. PT explains patient will be examined in standing, sitting, and lying down to see how their muscles and joints work. When they are ready, they will be asked to remove their underwear so PT can examine their perineum. The patient is also given the option of providing their own chaperone as one is not provided in our facility. The patient also has the right and is explained the right to defer or refuse any part of the evaluation or treatment including the internal exam. With the patient's consent, PT will use one gloved finger to gently assess the muscles of  the pelvic floor, seeing how well it contracts and relaxes and if there is muscle symmetry. After, the patient will get dressed and PT and patient will discuss exam findings and plan of care. PT and patient discuss plan of care, schedule, attendance policy and HEP activities.  PELVIC MMT:   MMT eval  Vaginal 3/5, 9s, 4 reps  Internal Anal Sphincter   External Anal Sphincter   Puborectalis   Diastasis Recti   (Blank rows = not tested)        TONE: Increased at Rt side  PROLAPSE: Not seen ant/post in hooklying with coughs  TODAY'S TREATMENT:                                                                                                                              DATE:   07/28/24 EVAL Examination completed, findings reviewed, pt educated on POC, HEP, and voiding mechanics and urge drill. Pt motivated to participate in PT and agreeable to attempt recommendations.    09/15/24: Voiding mechanics educated on with  abdominal massage and step stool use (pt reports she does use stool) Hooklying 2x10 diaphragmatic breathing with pelvic floor activation and lengthening - reports inconsistent awareness of tightening pelvic floor Hooklying ball squeezes (adduction) 2x10 with pelvic floor activation and exhale - greatly improved awareness and feeling of pelvic floor tightening> also attempted x10 hand ball squeezes and had greatly improved coordination of transverse abdominis activation and awareness of pelvic floor contraction 2x10 quick flicks with external counting for improved pacing.  2x10 blue band alternating marching + pelvic floor contraction+exhale 2x10 blue band hip abduction + pelvic floor activation+exhale 2x10 opposite hand/knee ball press +pelvic floor activation+exhale  09/22/24; X10 bird dogs X10 sit to stands with pelvic floor contraction and exhale Bridges with adductor 2x10 Pt educated on updated HEP with these exercises, handouts given Pt had questions about internal treatment  all answered and agreeable to completing next session.   PATIENT EDUCATION:  Education details: Psychologist, sport and exercise Person educated: Patient Education method: Solicitor, Actor cues, Verbal cues, and Handouts Education comprehension: verbalized understanding, returned demonstration, verbal cues required, tactile cues required, and needs further education  HOME EXERCISE PROGRAM: [id number]  ASSESSMENT:  CLINICAL IMPRESSION: Patient is a 73 y.o. female  who was seen today for physical therapy treatment for urine and fecal incontinence and increased urinary frequency. Pt pt reporting improvement with all symptoms but urinary incontinence and urinary and fecal urgency still bothersome. Pt progressing toward all goals, improving greatly and motivated to complete all HEP. Tolerated exercises well today but did benefit from verbal cues for techniques. Pt would benefit from additional PT to further address deficits.    OBJECTIVE IMPAIRMENTS: decreased activity tolerance, decreased coordination, decreased endurance, decreased mobility, decreased strength, increased fascial restrictions, increased muscle spasms, impaired flexibility, improper body mechanics, postural dysfunction, and pain.   ACTIVITY LIMITATIONS: continence  PARTICIPATION LIMITATIONS: community activity  PERSONAL FACTORS: Time since onset of injury/illness/exacerbation and 1 comorbidity: medical history are also affecting patient's functional outcome.   REHAB POTENTIAL: Good  CLINICAL DECISION MAKING: Stable/uncomplicated  EVALUATION COMPLEXITY: Low   GOALS: Goals reviewed with patient? Yes  SHORT TERM GOALS: Target date: 08/25/24  Pt to be I with HEP for carry over and continuing recommendations for improved outcomes.   Baseline: Goal status: MET  2.  Pt will be independent with the knack, urge suppression technique, and double voiding in order to improve bladder habits and decrease urinary incontinence.    Baseline:  Goal status: MET  3.  Pt will be independent with use of squatty potty, relaxed toileting mechanics, and improved bowel movement techniques in order to increase ease of bowel movements and complete evacuation.   Baseline:  Goal status: MET  4.  Pt to be I with pressure management with lifting 5# without feeling pressure vaginally or rectally without compensatory strategies to decreased strain at pelvic floor.  Baseline:  Goal status: MET   LONG TERM GOALS: Target date: 10/28/24  Pt to be I with advanced HEP for carry over and continuing recommendations for improved outcomes.   Baseline:  Goal status: on going  2.  Pt to demonstrate improved coordination of pelvic floor and breathing mechanics with 10# squat with appropriate synergistic patterns to decrease pain and leakage at least 75% of the time for improved ability to complete a 30 minute walk without strain at pelvic floor and symptoms.    Baseline:  Goal status: on going  3.  Pt to report no fecal leakage in at least 1 month due to improved pelvic  floor strength and coordination for improved confidence with community outings.  Baseline:  Goal status: on going  4.  Pt to report no urinary leakage in at least 1 week for improved confidence with community outings.  Baseline:  Goal status: on going   PLAN:  PT FREQUENCY: 1x/week  PT DURATION: 8 sessions  PLANNED INTERVENTIONS: 97110-Therapeutic exercises, 97530- Therapeutic activity, 97112- Neuromuscular re-education, 97535- Self Care, 02859- Manual therapy, [phone]- Canalith repositioning, [id number]- Aquatic Therapy, [phone] (1-2 muscles), 20561 (3+ muscles)- Dry Needling, Patient/Family education, Taping, Joint mobilization, Spinal mobilization, Scar mobilization, DME instructions, Cryotherapy, Moist heat, and Biofeedback  PLAN FOR NEXT SESSION: coordination of pelvic floor with core and hip strengthening   Darryle Navy, PT, DPT 10/02/259:57 AM  [facility] [address], [address] [place], [place] [address] Phone # [phone] Fax [phone]

## 2024-09-26 ENCOUNTER — Ambulatory Visit: Admitting: Licensed Clinical Social Worker

## 2024-09-26 DIAGNOSIS — H25813 Combined forms of age-related cataract, bilateral: Secondary | ICD-10-CM | POA: Diagnosis not present

## 2024-09-26 DIAGNOSIS — H04123 Dry eye syndrome of bilateral lacrimal glands: Secondary | ICD-10-CM | POA: Diagnosis not present

## 2024-09-27 ENCOUNTER — Encounter: Admitting: Physical Therapy

## 2024-09-29 ENCOUNTER — Ambulatory Visit: Admitting: Physical Therapy

## 2024-09-29 DIAGNOSIS — M6281 Muscle weakness (generalized): Secondary | ICD-10-CM

## 2024-09-29 DIAGNOSIS — R293 Abnormal posture: Secondary | ICD-10-CM | POA: Diagnosis not present

## 2024-09-29 DIAGNOSIS — R279 Unspecified lack of coordination: Secondary | ICD-10-CM

## 2024-09-29 NOTE — Patient Instructions (Signed)
 Types of Fiber  There are two main types of fiber:  insoluble and soluble.  Both of these types can prevent and relieve constipation and diarrhea, although some people find one or the other to be more easily digested.  This handout details information about both types of fiber. recommended 25-35 grams of fiber per day,  average 9-12 grams per meal   key is a balance between soluble and insoluble  Insoluble Fiber        Functions of Insoluble Fiber moves bulk through the intestines  controls and balances the pH (acidity) in the intestines   This type of fiber should be avoided or reduced if you have soft, frequent bowel movements or leakage      Benefits of Insoluble Fiber promotes regular bowel movement and prevents constipation  removes fecal waste through colon in less time  keeps an optimal pH in intestines to prevent microbes from producing cancer substances, therefore preventing colon cancer        Food Sources of Insoluble Fiber whole-wheat products  wheat bran "miller's bran" corn bran  flax seed or other seeds vegetables such as green beans, broccoli, cauliflower and potato skins  fruit skins and root vegetable skins  popcorn brown rice  Soluble Fiber( Types 5,6,7 for those with loose stools)       Functions of Soluble Fiber  holds water in the colon to bulk and soften the stool prolongs stomach emptying time so that sugar is released and absorbed more slowly  prevent leakage associated with soft, frequent bowel movements.        Benefits of Soluble Fiber lowers total cholesterol and LDL cholesterol (the bad cholesterol) therefore reducing the risk of heart disease  regulates blood sugar for people with diabetes       Food Sources of Soluble Fiber oat/oat bran dried beans and peas  nuts  barley  flax seed or other seeds fruits such as oranges, pears, peaches, and apples  vegetables such as carrots  psyllium husk  prunes

## 2024-09-29 NOTE — Therapy (Signed)
 OUTPATIENT PHYSICAL THERAPY FEMALE PELVIC TREATMENT   Patient Name: Barbara Munoz MRN: [id number] DOB:October 10, 1951, 73 y.o., female Today's Date: 09/29/2024  END OF SESSION:  PT End of Session - 09/29/24 1016     Visit Number 4    Date for Recertification  10/28/24    Authorization Type [employer]    Authorization Time Period Cohere Approved 8 visits-07/28/24-10/28/24-auth#[id number]    Authorization - Visit Number 4    Authorization - Number of Visits 8    PT Start Time 1015    PT Stop Time 1056    PT Time Calculation (min) 41 min    Activity Tolerance Patient tolerated treatment well    Behavior During Therapy WFL for tasks assessed/performed          Past Medical History:  Diagnosis Date   Anxiety and depression 09/08/2011   Diverticulosis    12/2022 colonoscopy   Female bladder prolapse    GERD (gastroesophageal reflux disease) 09/05/2011   Improved signif with PPI   History of adenomatous polyp of colon 08/2017   History of Clostridium difficile infection 2007   colonic changes c/w with this dx seen on colonoscopy 2007---sx's responded to flagyl .   History of cyst of breast    multiple drained during menopause   Hyperlipidemia    Hypotension 2022   ?idiopathic   IFG (impaired fasting glucose) 06/2018   Fastings 105-115 range when pt checked with husband's glucometer.  Fasting gluc here was 98 and A1c 5.6% 07/01/18.   Insomnia 09/05/2011   Migraine    worst during menopause   Orthostatic hypotension 11/20/2021   Overweight (BMI 25.0-29.9) 09/05/2011   Palpitations    rare PACs and PVCs on monitoring 12/2023   Seasonal allergic rhinitis    Subclinical hypothyroidism    Vertigo, benign positional 09/08/2011   Past Surgical History:  Procedure Laterality Date   CARDIOVASCULAR STRESS TEST  02/15/2015   Exercise myocard perfusion testing: Normal EF, normal wall motion, normal perfusion.  Poor exercise capacity (4 min).   COLONOSCOPY  07/2006; 08/25/17   2007:  pseudomembranous colitis, o/w normal. (Dr. Teressa).  08/2017: adenomas.  12/2022 adenomas.   Coronary calcium  score     2024 ZERO   COSMETIC SURGERY     DEXA  06/19/09; 01/19/2019   Normal 2010, 2020, 2024   ESOPHAGOGASTRODUODENOSCOPY     01/02/23, normal   POSTERIOR REPAIR  2012   REFRACTIVE SURGERY  05/2022   TONSILLECTOMY  1982   TRANSTHORACIC ECHOCARDIOGRAM  01/06/2022   NORMAL   Transvaginal ultrasound  10/2016   Normal (done for LLQ pain by Dr. Criselda).   TUBAL LIGATION  1983   vaginal hernia repair  05/2011   rectocele repair   ZIO monitoring  12/2023   rare PACs and PVCs   Patient Active Problem List   Diagnosis Date Noted   Breast tenderness 05/17/2024   Urinary incontinence, mixed 02/16/2024   Vaginal atrophy 02/16/2024   History of pelvic surgery 02/16/2024   Nocturia 02/16/2024   Orthostatic hypotension 11/20/2021   Breast lump 11/07/2019   Maxillary sinusitis 08/08/2015   Interdigital neuroma 11/06/2014   Health maintenance examination 11/03/2013   Preventative health care 11/09/2012   Anxiety and depression 09/08/2011   Pelvic organ prolapse quantification stage 2 cystocele    GERD (gastroesophageal reflux disease) 09/05/2011   Overweight (BMI 25.0-29.9) 09/05/2011   Insomnia 09/05/2011   Hyperlipidemia    Migraine     PCP: Candise Aleene DEL, MD   REFERRING PROVIDER:  Guadlupe Lianne DASEN, MD   REFERRING DIAG: R15.9 (ICD-10-CM) - Incontinence of feces, unspecified fecal incontinence type R35.1 (ICD-10-CM) - Nocturia N39.46 (ICD-10-CM) - Urinary incontinence, mixed  THERAPY DIAG:  Muscle weakness (generalized)  Unspecified lack of coordination  Abnormal posture  Rationale for Evaluation and Treatment: Rehabilitation  ONSET DATE: 12 years  SUBJECTIVE:                                                                                                                                                                                           SUBJECTIVE  STATEMENT: Pt reports she is only having urinary incontinence with urge and has not needed to change pads in several weeks now. But on Sunday was very fatigued and didn't feel well and had more urge leakage and needed to change pad due to amount of leakage.   Does feel like gas is still happening but not as bad.    Fluid intake: water - 30oz; 30oz of coffees/teas/sodas  PAIN:  Are you having pain? No   PRECAUTIONS: None  RED FLAGS: None   WEIGHT BEARING RESTRICTIONS: No  FALLS:  Has patient fallen in last 6 months? No  OCCUPATION: retired  ACTIVITY LEVEL : low  PLOF: Independent  PATIENT GOALS: to get stronger overall, regular bowel movements, no leakage  PERTINENT HISTORY:  Depression, Diverticulosis, Female bladder prolapse, Clostridium difficile infection, rectocele repair, Vaginal atrophy, Orthostatic hypotension, 2 cystocele  Sexual abuse: No  BOWEL MOVEMENT: Pain with bowel movement: No Type of bowel movement:Type (Bristol Stool Scale) varies from 2-6 but right now usually 2-3, Frequency every 3rd day, and Strain not often Fully empty rectum: No Leakage: Yes: smearing when thinks she is passing gas sometimes mucus Pads: No Fiber supplement/laxative Yes attempted miralax   URINATION: Pain with urination: No Fully empty bladder: No Stream: Weak Urgency: Yes  Frequency: not quicker than every 2 hours during the day, does do just in case; 1x nightly  Leakage: Urge to void and Walking to the bathroom Pads: No  INTERCOURSE:  Ability to have vaginal penetration No  Pain with intercourse: none   PREGNANCY: Vaginal deliveries 2 Episiotomy Yes  C-section deliveries 0 Currently pregnant No  PROLAPSE: Pressure felt vaginally and did have rectocele repaired previously   OBJECTIVE:  Note: Objective measures were completed at Evaluation unless otherwise noted.  DIAGNOSTIC FINDINGS:    PATIENT SURVEYS:    PFIQ-7: 33  COGNITION: Overall cognitive  status: Within functional limits for tasks assessed     SENSATION: Light touch: Appears intact  LUMBAR SPECIAL TESTS:  Single leg stance test: pelvic instability noted bil  FUNCTIONAL TESTS:  Functional  squat limited in descent bu 25% and bil knee valgus   GAIT: WFL  POSTURE: rounded shoulders, forward head, and posterior pelvic tilt   LUMBARAROM/PROM:  A/PROM A/PROM  eval  Flexion Limited by 25%  Extension WFL  Right lateral flexion Limited by 25%  Left lateral flexion Limited by 25%  Right rotation WFL  Left rotation WFL   (Blank rows = not tested)  LOWER EXTREMITY ROM:  Bil hamstrings and adductors limited by 25%  LOWER EXTREMITY MMT:  Bil hips grossly 4/5 PALPATION:   General: no TTP but tight bil lumbar paraspinals and gluteals   Pelvic Alignment: WFL  Abdominal: no TTP but mild fascial restrictions in all quadrants                 External Perineal Exam: [facility]                             Internal Pelvic Floor: tightness on Rt superficial and deep layer with TTP  Patient confirms identification and approves PT to assess internal pelvic floor and treatment Yes No emotional/communication barriers or cognitive limitation. Patient is motivated to learn. Patient understands and agrees with treatment goals and plan. PT explains patient will be examined in standing, sitting, and lying down to see how their muscles and joints work. When they are ready, they will be asked to remove their underwear so PT can examine their perineum. The patient is also given the option of providing their own chaperone as one is not provided in our facility. The patient also has the right and is explained the right to defer or refuse any part of the evaluation or treatment including the internal exam. With the patient's consent, PT will use one gloved finger to gently assess the muscles of the pelvic floor, seeing how well it contracts and relaxes and if there is muscle symmetry. After, the  patient will get dressed and PT and patient will discuss exam findings and plan of care. PT and patient discuss plan of care, schedule, attendance policy and HEP activities.  PELVIC MMT:   MMT eval  Vaginal 3/5, 9s, 4 reps  Internal Anal Sphincter   External Anal Sphincter   Puborectalis   Diastasis Recti   (Blank rows = not tested)        TONE: Increased at Rt side  PROLAPSE: Not seen ant/post in hooklying with coughs  TODAY'S TREATMENT:                                                                                                                              DATE:   07/28/24 EVAL Examination completed, findings reviewed, pt educated on POC, HEP, and voiding mechanics and urge drill. Pt motivated to participate in PT and agreeable to attempt recommendations.    09/15/24: Voiding mechanics educated on with abdominal massage and step stool use (pt reports she does use stool) Hooklying  2x10 diaphragmatic breathing with pelvic floor activation and lengthening - reports inconsistent awareness of tightening pelvic floor Hooklying ball squeezes (adduction) 2x10 with pelvic floor activation and exhale - greatly improved awareness and feeling of pelvic floor tightening> also attempted x10 hand ball squeezes and had greatly improved coordination of transverse abdominis activation and awareness of pelvic floor contraction 2x10 quick flicks with external counting for improved pacing.  2x10 blue band alternating marching + pelvic floor contraction+exhale 2x10 blue band hip abduction + pelvic floor activation+exhale 2x10 opposite hand/knee ball press +pelvic floor activation+exhale  09/22/24; X10 bird dogs X10 sit to stands with pelvic floor contraction and exhale Bridges with adductor 2x10 Pt educated on updated HEP with these exercises, handouts given Pt had questions about internal treatment all answered and agreeable to completing next session.   09/29/24: 2x10 opposite hand/knee hand press  (10 hooklying 10 sitting EOM) Pec stretches 3x30s standing in doorway Angel wings x10 Forward bent rows blue band 2x10 each Pt had questions about fibromyalgia and pain patterns Pt did recommend pt asking primary or even seeking out a pain provider. Pt reports she will try this.    PATIENT EDUCATION:  Education details: Psychologist, sport and exercise Person educated: Patient Education method: Solicitor, Actor cues, Verbal cues, and Handouts Education comprehension: verbalized understanding, returned demonstration, verbal cues required, tactile cues required, and needs further education  HOME EXERCISE PROGRAM: [id number]  ASSESSMENT:  CLINICAL IMPRESSION: Patient is a 73 y.o. female  who was seen today for physical therapy treatment for urine and fecal incontinence and increased urinary frequency. Pt reporting improvement with all symptoms not having fecal incontinence but has been constipated for 3 days. Majority of session spent on pt's questions about HEP and posture mechanics for decreased strain on pelvic floor. Pt agreeable for internal at later time, PT did educate pt this is not mandatory and if needed when can do it when pt agreeable. Pt tolerated well, does have rounded shoulders and lengthened rhomboids and benefits from posture training.  Pt would benefit from additional PT to further address deficits.    OBJECTIVE IMPAIRMENTS: decreased activity tolerance, decreased coordination, decreased endurance, decreased mobility, decreased strength, increased fascial restrictions, increased muscle spasms, impaired flexibility, improper body mechanics, postural dysfunction, and pain.   ACTIVITY LIMITATIONS: continence  PARTICIPATION LIMITATIONS: community activity  PERSONAL FACTORS: Time since onset of injury/illness/exacerbation and 1 comorbidity: medical history are also affecting patient's functional outcome.   REHAB POTENTIAL: Good  CLINICAL DECISION MAKING:  Stable/uncomplicated  EVALUATION COMPLEXITY: Low   GOALS: Goals reviewed with patient? Yes  SHORT TERM GOALS: Target date: 08/25/24  Pt to be I with HEP for carry over and continuing recommendations for improved outcomes.   Baseline: Goal status: MET  2.  Pt will be independent with the knack, urge suppression technique, and double voiding in order to improve bladder habits and decrease urinary incontinence.   Baseline:  Goal status: MET  3.  Pt will be independent with use of squatty potty, relaxed toileting mechanics, and improved bowel movement techniques in order to increase ease of bowel movements and complete evacuation.   Baseline:  Goal status: MET  4.  Pt to be I with pressure management with lifting 5# without feeling pressure vaginally or rectally without compensatory strategies to decreased strain at pelvic floor.  Baseline:  Goal status: MET   LONG TERM GOALS: Target date: 10/28/24  Pt to be I with advanced HEP for carry over and continuing recommendations for improved outcomes.   Baseline:  Goal  status: on going  2.  Pt to demonstrate improved coordination of pelvic floor and breathing mechanics with 10# squat with appropriate synergistic patterns to decrease pain and leakage at least 75% of the time for improved ability to complete a 30 minute walk without strain at pelvic floor and symptoms.    Baseline:  Goal status: on going  3.  Pt to report no fecal leakage in at least 1 month due to improved pelvic floor strength and coordination for improved confidence with community outings.  Baseline:  Goal status: on going  4.  Pt to report no urinary leakage in at least 1 week for improved confidence with community outings.  Baseline:  Goal status: on going   PLAN:  PT FREQUENCY: 1x/week  PT DURATION: 8 sessions  PLANNED INTERVENTIONS: 97110-Therapeutic exercises, 97530- Therapeutic activity, 97112- Neuromuscular re-education, 97535- Self Care, 02859- Manual  therapy, [phone]- Canalith repositioning, [id number]- Aquatic Therapy, [phone] (1-2 muscles), [id number] (3+ muscles)- Dry Needling, Patient/Family education, Taping, Joint mobilization, Spinal mobilization, Scar mobilization, DME instructions, Cryotherapy, Moist heat, and Biofeedback  PLAN FOR NEXT SESSION: coordination of pelvic floor with core and hip strengthening   Darryle Navy, PT, DPT 09/29/2510:23 AM  [facility] [address] [place], [place] [address] Phone # [phone] Fax [phone]

## 2024-10-04 ENCOUNTER — Ambulatory Visit: Admitting: Licensed Clinical Social Worker

## 2024-10-06 ENCOUNTER — Ambulatory Visit: Admitting: Physical Therapy

## 2024-10-06 DIAGNOSIS — R293 Abnormal posture: Secondary | ICD-10-CM

## 2024-10-06 DIAGNOSIS — R279 Unspecified lack of coordination: Secondary | ICD-10-CM | POA: Diagnosis not present

## 2024-10-06 DIAGNOSIS — M6281 Muscle weakness (generalized): Secondary | ICD-10-CM | POA: Diagnosis not present

## 2024-10-06 NOTE — Therapy (Signed)
 OUTPATIENT PHYSICAL THERAPY FEMALE PELVIC TREATMENT   Patient Name: Barbara Munoz MRN: [id number] DOB:08/26/1951, 73 y.o., female Today's Date: 10/06/2024  END OF SESSION:  PT End of Session - 10/06/24 1011     Visit Number 5    Date for Recertification  10/28/24    Authorization Type [employer]    Authorization Time Period Cohere Approved 8 visits-07/28/24-10/28/24-auth#[id number]    Authorization - Visit Number 5    Authorization - Number of Visits 8    PT Start Time 1012    PT Stop Time 1055    PT Time Calculation (min) 43 min    Activity Tolerance Patient tolerated treatment well    Behavior During Therapy WFL for tasks assessed/performed          Past Medical History:  Diagnosis Date   Anxiety and depression 09/08/2011   Diverticulosis    12/2022 colonoscopy   Female bladder prolapse    GERD (gastroesophageal reflux disease) 09/05/2011   Improved signif with PPI   History of adenomatous polyp of colon 08/2017   History of Clostridium difficile infection 2007   colonic changes c/w with this dx seen on colonoscopy 2007---sx's responded to flagyl .   History of cyst of breast    multiple drained during menopause   Hyperlipidemia    Hypotension 2022   ?idiopathic   IFG (impaired fasting glucose) 06/2018   Fastings 105-115 range when pt checked with husband's glucometer.  Fasting gluc here was 98 and A1c 5.6% 07/01/18.   Insomnia 09/05/2011   Migraine    worst during menopause   Orthostatic hypotension 11/20/2021   Overweight (BMI 25.0-29.9) 09/05/2011   Palpitations    rare PACs and PVCs on monitoring 12/2023   Seasonal allergic rhinitis    Subclinical hypothyroidism    Vertigo, benign positional 09/08/2011   Past Surgical History:  Procedure Laterality Date   CARDIOVASCULAR STRESS TEST  02/15/2015   Exercise myocard perfusion testing: Normal EF, normal wall motion, normal perfusion.  Poor exercise capacity (4 min).   COLONOSCOPY  07/2006; 08/25/17   2007:  pseudomembranous colitis, o/w normal. (Dr. Teressa).  08/2017: adenomas.  12/2022 adenomas.   Coronary calcium  score     2024 ZERO   COSMETIC SURGERY     DEXA  06/19/09; 01/19/2019   Normal 2010, 2020, 2024   ESOPHAGOGASTRODUODENOSCOPY     01/02/23, normal   POSTERIOR REPAIR  2012   REFRACTIVE SURGERY  05/2022   TONSILLECTOMY  1982   TRANSTHORACIC ECHOCARDIOGRAM  01/06/2022   NORMAL   Transvaginal ultrasound  10/2016   Normal (done for LLQ pain by Dr. Criselda).   TUBAL LIGATION  1983   vaginal hernia repair  05/2011   rectocele repair   ZIO monitoring  12/2023   rare PACs and PVCs   Patient Active Problem List   Diagnosis Date Noted   Breast tenderness 05/17/2024   Urinary incontinence, mixed 02/16/2024   Vaginal atrophy 02/16/2024   History of pelvic surgery 02/16/2024   Nocturia 02/16/2024   Orthostatic hypotension 11/20/2021   Breast lump 11/07/2019   Maxillary sinusitis 08/08/2015   Interdigital neuroma 11/06/2014   Health maintenance examination 11/03/2013   Preventative health care 11/09/2012   Anxiety and depression 09/08/2011   Pelvic organ prolapse quantification stage 2 cystocele    GERD (gastroesophageal reflux disease) 09/05/2011   Overweight (BMI 25.0-29.9) 09/05/2011   Insomnia 09/05/2011   Hyperlipidemia    Migraine     PCP: Candise Aleene DEL, MD   REFERRING PROVIDER:  Guadlupe Lianne DASEN, MD   REFERRING DIAG: R15.9 (ICD-10-CM) - Incontinence of feces, unspecified fecal incontinence type R35.1 (ICD-10-CM) - Nocturia N39.46 (ICD-10-CM) - Urinary incontinence, mixed  THERAPY DIAG:  Muscle weakness (generalized)  Unspecified lack of coordination  Abnormal posture  Rationale for Evaluation and Treatment: Rehabilitation  ONSET DATE: 12 years  SUBJECTIVE:                                                                                                                                                                                           SUBJECTIVE  STATEMENT: Had one instance of urinary incontinence in the past week, bowels haven't been bad but not regular. Has had ~5 bowel movements per week, but this week smaller bowel movement, type 1-2. But reports she doesn't think she is getting enough water. Only had one instance of smearing of fecal matter. Pt reports she feels like she is doing well overall and pleased with progress so far.   Does feel like gas is still happening but not as bad.    Fluid intake: water - 30oz; 30oz of coffees/teas/sodas  PAIN:  Are you having pain? No   PRECAUTIONS: None  RED FLAGS: None   WEIGHT BEARING RESTRICTIONS: No  FALLS:  Has patient fallen in last 6 months? No  OCCUPATION: retired  ACTIVITY LEVEL : low  PLOF: Independent  PATIENT GOALS: to get stronger overall, regular bowel movements, no leakage  PERTINENT HISTORY:  Depression, Diverticulosis, Female bladder prolapse, Clostridium difficile infection, rectocele repair, Vaginal atrophy, Orthostatic hypotension, 2 cystocele  Sexual abuse: No  BOWEL MOVEMENT: Pain with bowel movement: No Type of bowel movement:Type (Bristol Stool Scale) varies from 2-6 but right now usually 2-3, Frequency every 3rd day, and Strain not often Fully empty rectum: No Leakage: Yes: smearing when thinks she is passing gas sometimes mucus Pads: No Fiber supplement/laxative Yes attempted miralax   URINATION: Pain with urination: No Fully empty bladder: No Stream: Weak Urgency: Yes  Frequency: not quicker than every 2 hours during the day, does do just in case; 1x nightly  Leakage: Urge to void and Walking to the bathroom Pads: No  INTERCOURSE:  Ability to have vaginal penetration No  Pain with intercourse: none   PREGNANCY: Vaginal deliveries 2 Episiotomy Yes  C-section deliveries 0 Currently pregnant No  PROLAPSE: Pressure felt vaginally and did have rectocele repaired previously   OBJECTIVE:  Note: Objective measures were completed at  Evaluation unless otherwise noted.  DIAGNOSTIC FINDINGS:    PATIENT SURVEYS:    PFIQ-7: 22  COGNITION: Overall cognitive status: Within functional limits for tasks assessed     SENSATION: Light  touch: Appears intact  LUMBAR SPECIAL TESTS:  Single leg stance test: pelvic instability noted bil  FUNCTIONAL TESTS:  Functional squat limited in descent bu 25% and bil knee valgus   GAIT: WFL  POSTURE: rounded shoulders, forward head, and posterior pelvic tilt   LUMBARAROM/PROM:  A/PROM A/PROM  eval  Flexion Limited by 25%  Extension WFL  Right lateral flexion Limited by 25%  Left lateral flexion Limited by 25%  Right rotation WFL  Left rotation WFL   (Blank rows = not tested)  LOWER EXTREMITY ROM:  Bil hamstrings and adductors limited by 25%  LOWER EXTREMITY MMT:  Bil hips grossly 4/5 PALPATION:   General: no TTP but tight bil lumbar paraspinals and gluteals   Pelvic Alignment: WFL  Abdominal: no TTP but mild fascial restrictions in all quadrants                 External Perineal Exam: [facility]                             Internal Pelvic Floor: tightness on Rt superficial and deep layer with TTP  Patient confirms identification and approves PT to assess internal pelvic floor and treatment Yes No emotional/communication barriers or cognitive limitation. Patient is motivated to learn. Patient understands and agrees with treatment goals and plan. PT explains patient will be examined in standing, sitting, and lying down to see how their muscles and joints work. When they are ready, they will be asked to remove their underwear so PT can examine their perineum. The patient is also given the option of providing their own chaperone as one is not provided in our facility. The patient also has the right and is explained the right to defer or refuse any part of the evaluation or treatment including the internal exam. With the patient's consent, PT will use one gloved finger to  gently assess the muscles of the pelvic floor, seeing how well it contracts and relaxes and if there is muscle symmetry. After, the patient will get dressed and PT and patient will discuss exam findings and plan of care. PT and patient discuss plan of care, schedule, attendance policy and HEP activities.  PELVIC MMT:   MMT eval  Vaginal 3/5, 9s, 4 reps  Internal Anal Sphincter   External Anal Sphincter   Puborectalis   Diastasis Recti   (Blank rows = not tested)        TONE: Increased at Rt side  PROLAPSE: Not seen ant/post in hooklying with coughs  TODAY'S TREATMENT:                                                                                                                              DATE:   09/22/24; X10 bird dogs X10 sit to stands with pelvic floor contraction and exhale Bridges with adductor 2x10 Pt educated on updated HEP with these exercises, handouts given Pt  had questions about internal treatment all answered and agreeable to completing next session.   09/29/24: 2x10 opposite hand/knee hand press (10 hooklying 10 sitting EOM) Pec stretches 3x30s standing in doorway Angel wings x10 Forward bent rows blue band 2x10 each Pt had questions about fibromyalgia and pain patterns Pt did recommend pt asking primary or even seeking out a pain provider. Pt reports she will try this.   10/06/24: Abdominal massage completed x5 and reviewed to attempt regularly for improved bowel movement consistency Also to attempt more water intake and or fiber with meals Reviewed HEP and discussed review  Pt wanted to review bowel recommendations to make sure she understood carry over for home and reports no questions at end of session  PATIENT EDUCATION:  Education details: [id number] Person educated: Patient Education method: Explanation, Demonstration, Tactile cues, Verbal cues, and Handouts Education comprehension: verbalized understanding, returned demonstration, verbal cues required,  tactile cues required, and needs further education  HOME EXERCISE PROGRAM: [id number]  ASSESSMENT:  CLINICAL IMPRESSION: Patient is a 73 y.o. female  who was seen today for physical therapy treatment for urine and fecal incontinence and increased urinary frequency. Pt continues to improve overall, urinary incontinence is greatly improving bowels are slowly improving and requests more time on this today. No additional questions at end of session. Pt would benefit from additional PT to further address deficits.    OBJECTIVE IMPAIRMENTS: decreased activity tolerance, decreased coordination, decreased endurance, decreased mobility, decreased strength, increased fascial restrictions, increased muscle spasms, impaired flexibility, improper body mechanics, postural dysfunction, and pain.   ACTIVITY LIMITATIONS: continence  PARTICIPATION LIMITATIONS: community activity  PERSONAL FACTORS: Time since onset of injury/illness/exacerbation and 1 comorbidity: medical history are also affecting patient's functional outcome.   REHAB POTENTIAL: Good  CLINICAL DECISION MAKING: Stable/uncomplicated  EVALUATION COMPLEXITY: Low   GOALS: Goals reviewed with patient? Yes  SHORT TERM GOALS: Target date: 08/25/24  Pt to be I with HEP for carry over and continuing recommendations for improved outcomes.   Baseline: Goal status: MET  2.  Pt will be independent with the knack, urge suppression technique, and double voiding in order to improve bladder habits and decrease urinary incontinence.   Baseline:  Goal status: MET  3.  Pt will be independent with use of squatty potty, relaxed toileting mechanics, and improved bowel movement techniques in order to increase ease of bowel movements and complete evacuation.   Baseline:  Goal status: MET  4.  Pt to be I with pressure management with lifting 5# without feeling pressure vaginally or rectally without compensatory strategies to decreased strain at pelvic  floor.  Baseline:  Goal status: MET   LONG TERM GOALS: Target date: 10/28/24  Pt to be I with advanced HEP for carry over and continuing recommendations for improved outcomes.   Baseline:  Goal status: on going  2.  Pt to demonstrate improved coordination of pelvic floor and breathing mechanics with 10# squat with appropriate synergistic patterns to decrease pain and leakage at least 75% of the time for improved ability to complete a 30 minute walk without strain at pelvic floor and symptoms.    Baseline:  Goal status: on going  3.  Pt to report no fecal leakage in at least 1 month due to improved pelvic floor strength and coordination for improved confidence with community outings.  Baseline:  Goal status: on going  4.  Pt to report no urinary leakage in at least 1 week for improved confidence with community outings.  Baseline:  Goal status: on going   PLAN:  PT FREQUENCY: 1x/week  PT DURATION: 8 sessions  PLANNED INTERVENTIONS: 97110-Therapeutic exercises, 97530- Therapeutic activity, 97112- Neuromuscular re-education, 97535- Self Care, 02859- Manual therapy, [phone]- Canalith repositioning, [id number]- Aquatic Therapy, [phone] (1-2 muscles), [phone] (3+ muscles)- Dry Needling, Patient/Family education, Taping, Joint mobilization, Spinal mobilization, Scar mobilization, DME instructions, Cryotherapy, Moist heat, and Biofeedback  PLAN FOR NEXT SESSION: coordination of pelvic floor with core and hip strengthening   Darryle Navy, PT, DPT 10/06/2510:24 AM  [facility] [address], [address] [place], [place] [address] Phone # [phone] Fax [phone]

## 2024-10-09 ENCOUNTER — Other Ambulatory Visit (HOSPITAL_BASED_OUTPATIENT_CLINIC_OR_DEPARTMENT_OTHER): Payer: Self-pay | Admitting: Cardiovascular Disease

## 2024-10-13 ENCOUNTER — Ambulatory Visit: Admitting: Physical Therapy

## 2024-10-13 DIAGNOSIS — M6281 Muscle weakness (generalized): Secondary | ICD-10-CM

## 2024-10-13 DIAGNOSIS — R293 Abnormal posture: Secondary | ICD-10-CM | POA: Diagnosis not present

## 2024-10-13 DIAGNOSIS — R279 Unspecified lack of coordination: Secondary | ICD-10-CM

## 2024-10-13 NOTE — Therapy (Signed)
 OUTPATIENT PHYSICAL THERAPY FEMALE PELVIC TREATMENT   Patient Name: Barbara Munoz MRN: [id number] DOB:29-Dec-1950, 73 y.o., female Today's Date: 10/13/2024  END OF SESSION:  PT End of Session - 10/13/24 1022     Visit Number 6    Date for Recertification  10/28/24    Authorization Type [employer]    Authorization Time Period Cohere Approved 8 visits-07/28/24-10/28/24-auth#[id number]    Authorization - Visit Number 6    Authorization - Number of Visits 8    PT Start Time 1018    PT Stop Time 1058    PT Time Calculation (min) 40 min    Activity Tolerance Patient tolerated treatment well    Behavior During Therapy [facility] for tasks assessed/performed          Past Medical History:  Diagnosis Date   Anxiety and depression 09/08/2011   Diverticulosis    12/2022 colonoscopy   Female bladder prolapse    GERD (gastroesophageal reflux disease) 09/05/2011   Improved signif with PPI   History of adenomatous polyp of colon 08/2017   History of Clostridium difficile infection 2007   colonic changes c/w with this dx seen on colonoscopy 2007---sx's responded to flagyl .   History of cyst of breast    multiple drained during menopause   Hyperlipidemia    Hypotension 2022   ?idiopathic   IFG (impaired fasting glucose) 06/2018   Fastings 105-115 range when pt checked with husband's glucometer.  Fasting gluc here was 98 and A1c 5.6% 07/01/18.   Insomnia 09/05/2011   Migraine    worst during menopause   Orthostatic hypotension 11/20/2021   Overweight (BMI 25.0-29.9) 09/05/2011   Palpitations    rare PACs and PVCs on monitoring 12/2023   Seasonal allergic rhinitis    Subclinical hypothyroidism    Vertigo, benign positional 09/08/2011   Past Surgical History:  Procedure Laterality Date   CARDIOVASCULAR STRESS TEST  02/15/2015   Exercise myocard perfusion testing: Normal EF, normal wall motion, normal perfusion.  Poor exercise capacity (4 min).   COLONOSCOPY  07/2006; 08/25/17   2007:  pseudomembranous colitis, o/w normal. (Dr. Teressa).  08/2017: adenomas.  12/2022 adenomas.   Coronary calcium  score     2024 ZERO   COSMETIC SURGERY     DEXA  06/19/09; 01/19/2019   Normal 2010, 2020, 2024   ESOPHAGOGASTRODUODENOSCOPY     01/02/23, normal   POSTERIOR REPAIR  2012   REFRACTIVE SURGERY  05/2022   TONSILLECTOMY  1982   TRANSTHORACIC ECHOCARDIOGRAM  01/06/2022   NORMAL   Transvaginal ultrasound  10/2016   Normal (done for LLQ pain by Dr. Criselda).   TUBAL LIGATION  1983   vaginal hernia repair  05/2011   rectocele repair   ZIO monitoring  12/2023   rare PACs and PVCs   Patient Active Problem List   Diagnosis Date Noted   Breast tenderness 05/17/2024   Urinary incontinence, mixed 02/16/2024   Vaginal atrophy 02/16/2024   History of pelvic surgery 02/16/2024   Nocturia 02/16/2024   Orthostatic hypotension 11/20/2021   Breast lump 11/07/2019   Maxillary sinusitis 08/08/2015   Interdigital neuroma 11/06/2014   Health maintenance examination 11/03/2013   Preventative health care 11/09/2012   Anxiety and depression 09/08/2011   Pelvic organ prolapse quantification stage 2 cystocele    GERD (gastroesophageal reflux disease) 09/05/2011   Overweight (BMI 25.0-29.9) 09/05/2011   Insomnia 09/05/2011   Hyperlipidemia    Migraine     PCP: Candise Aleene DEL, MD   REFERRING PROVIDER:  Guadlupe Lianne DASEN, MD   REFERRING DIAG: R15.9 (ICD-10-CM) - Incontinence of feces, unspecified fecal incontinence type R35.1 (ICD-10-CM) - Nocturia N39.46 (ICD-10-CM) - Urinary incontinence, mixed  THERAPY DIAG:  Muscle weakness (generalized)  Unspecified lack of coordination  Abnormal posture  Rationale for Evaluation and Treatment: Rehabilitation  ONSET DATE: 12 years  SUBJECTIVE:                                                                                                                                                                                           SUBJECTIVE  STATEMENT: Wants to go over exercises to make sure she is doing everything correct as she is trying to be more active. Reports able to hold urine for 3 hours often times now during the day, and has had a bowel movement almost every day since starting consistent abdominal massage   Does feel like gas is still happening but not as bad.    Fluid intake: water - 30oz; 30oz of coffees/teas/sodas  PAIN:  Are you having pain? No   PRECAUTIONS: None  RED FLAGS: None   WEIGHT BEARING RESTRICTIONS: No  FALLS:  Has patient fallen in last 6 months? No  OCCUPATION: retired  ACTIVITY LEVEL : low  PLOF: Independent  PATIENT GOALS: to get stronger overall, regular bowel movements, no leakage  PERTINENT HISTORY:  Depression, Diverticulosis, Female bladder prolapse, Clostridium difficile infection, rectocele repair, Vaginal atrophy, Orthostatic hypotension, 2 cystocele  Sexual abuse: No  BOWEL MOVEMENT: Pain with bowel movement: No Type of bowel movement:Type (Bristol Stool Scale) varies from 2-6 but right now usually 2-3, Frequency every 3rd day, and Strain not often Fully empty rectum: No Leakage: Yes: smearing when thinks she is passing gas sometimes mucus Pads: No Fiber supplement/laxative Yes attempted miralax   URINATION: Pain with urination: No Fully empty bladder: No Stream: Weak Urgency: Yes  Frequency: not quicker than every 2 hours during the day, does do just in case; 1x nightly  Leakage: Urge to void and Walking to the bathroom Pads: No  INTERCOURSE:  Ability to have vaginal penetration No  Pain with intercourse: none   PREGNANCY: Vaginal deliveries 2 Episiotomy Yes  C-section deliveries 0 Currently pregnant No  PROLAPSE: Pressure felt vaginally and did have rectocele repaired previously   OBJECTIVE:  Note: Objective measures were completed at Evaluation unless otherwise noted.  DIAGNOSTIC FINDINGS:    PATIENT SURVEYS:    PFIQ-7:  25  COGNITION: Overall cognitive status: Within functional limits for tasks assessed     SENSATION: Light touch: Appears intact  LUMBAR SPECIAL TESTS:  Single leg stance test: pelvic instability noted bil  FUNCTIONAL  TESTS:  Functional squat limited in descent bu 25% and bil knee valgus   GAIT: WFL  POSTURE: rounded shoulders, forward head, and posterior pelvic tilt   LUMBARAROM/PROM:  A/PROM A/PROM  eval  Flexion Limited by 25%  Extension WFL  Right lateral flexion Limited by 25%  Left lateral flexion Limited by 25%  Right rotation WFL  Left rotation WFL   (Blank rows = not tested)  LOWER EXTREMITY ROM:  Bil hamstrings and adductors limited by 25%  LOWER EXTREMITY MMT:  Bil hips grossly 4/5 PALPATION:   General: no TTP but tight bil lumbar paraspinals and gluteals   Pelvic Alignment: WFL  Abdominal: no TTP but mild fascial restrictions in all quadrants                 External Perineal Exam: [facility]                             Internal Pelvic Floor: tightness on Rt superficial and deep layer with TTP  Patient confirms identification and approves PT to assess internal pelvic floor and treatment Yes No emotional/communication barriers or cognitive limitation. Patient is motivated to learn. Patient understands and agrees with treatment goals and plan. PT explains patient will be examined in standing, sitting, and lying down to see how their muscles and joints work. When they are ready, they will be asked to remove their underwear so PT can examine their perineum. The patient is also given the option of providing their own chaperone as one is not provided in our facility. The patient also has the right and is explained the right to defer or refuse any part of the evaluation or treatment including the internal exam. With the patient's consent, PT will use one gloved finger to gently assess the muscles of the pelvic floor, seeing how well it contracts and relaxes and if  there is muscle symmetry. After, the patient will get dressed and PT and patient will discuss exam findings and plan of care. PT and patient discuss plan of care, schedule, attendance policy and HEP activities.  PELVIC MMT:   MMT eval  Vaginal 3/5, 9s, 4 reps  Internal Anal Sphincter   External Anal Sphincter   Puborectalis   Diastasis Recti   (Blank rows = not tested)        TONE: Increased at Rt side  PROLAPSE: Not seen ant/post in hooklying with coughs  TODAY'S TREATMENT:                                                                                                                              DATE:     10/06/24: Abdominal massage completed x5 and reviewed to attempt regularly for improved bowel movement consistency Also to attempt more water intake and or fiber with meals Reviewed HEP and discussed review  Pt wanted to review bowel recommendations to make sure she understood carry over  for home and reports no questions at end of session  10/13/24: Educated on pelvic floor activations with transfers as she still sees leakage with this most often  Reviewed her exercises from home (outside of HEP) good techniques and no leakage reported  Biebel's carry 20# (10 each hand) 1500' mild cues for core activation and posture 10# each hand for hurdle stepping 3 hurdles x5 each way - one toe catch on middle step but good balance righting with stepping strategy  10# OHP with alt march in place 2x10 each   PATIENT EDUCATION:  Education details: Psychologist, sport and exercise Person educated: Patient Education method: Explanation, Demonstration, Tactile cues, Verbal cues, and Handouts Education comprehension: verbalized understanding, returned demonstration, verbal cues required, tactile cues required, and needs further education  HOME EXERCISE PROGRAM: [id number]  ASSESSMENT:  CLINICAL IMPRESSION: Patient is a 73 y.o. female  who was seen today for physical therapy treatment for urine and fecal  incontinence and increased urinary frequency. Pt continues to improve overall, urinary incontinence is greatly improving bowels are slowly improving and requests more time on this today. No additional questions at end of session. Pt would benefit from additional PT to further address deficits.    OBJECTIVE IMPAIRMENTS: decreased activity tolerance, decreased coordination, decreased endurance, decreased mobility, decreased strength, increased fascial restrictions, increased muscle spasms, impaired flexibility, improper body mechanics, postural dysfunction, and pain.   ACTIVITY LIMITATIONS: continence  PARTICIPATION LIMITATIONS: community activity  PERSONAL FACTORS: Time since onset of injury/illness/exacerbation and 1 comorbidity: medical history are also affecting patient's functional outcome.   REHAB POTENTIAL: Good  CLINICAL DECISION MAKING: Stable/uncomplicated  EVALUATION COMPLEXITY: Low   GOALS: Goals reviewed with patient? Yes  SHORT TERM GOALS: Target date: 08/25/24  Pt to be I with HEP for carry over and continuing recommendations for improved outcomes.   Baseline: Goal status: MET  2.  Pt will be independent with the knack, urge suppression technique, and double voiding in order to improve bladder habits and decrease urinary incontinence.   Baseline:  Goal status: MET  3.  Pt will be independent with use of squatty potty, relaxed toileting mechanics, and improved bowel movement techniques in order to increase ease of bowel movements and complete evacuation.   Baseline:  Goal status: MET  4.  Pt to be I with pressure management with lifting 5# without feeling pressure vaginally or rectally without compensatory strategies to decreased strain at pelvic floor.  Baseline:  Goal status: MET   LONG TERM GOALS: Target date: 10/28/24  Pt to be I with advanced HEP for carry over and continuing recommendations for improved outcomes.   Baseline:  Goal status: on going  2.  Pt  to demonstrate improved coordination of pelvic floor and breathing mechanics with 10# squat with appropriate synergistic patterns to decrease pain and leakage at least 75% of the time for improved ability to complete a 30 minute walk without strain at pelvic floor and symptoms.    Baseline:  Goal status: on going  3.  Pt to report no fecal leakage in at least 1 month due to improved pelvic floor strength and coordination for improved confidence with community outings.  Baseline:  Goal status: on going  4.  Pt to report no urinary leakage in at least 1 week for improved confidence with community outings.  Baseline:  Goal status: on going   PLAN:  PT FREQUENCY: 1x/week  PT DURATION: 8 sessions  PLANNED INTERVENTIONS: 97110-Therapeutic exercises, 97530- Therapeutic activity, [id number]- Neuromuscular re-education, 97535- Self Care, 02859- Manual  therapy, [phone]- Canalith repositioning, 02886- Aquatic Therapy, [phone] (1-2 muscles), [phone] (3+ muscles)- Dry Needling, Patient/Family education, Taping, Joint mobilization, Spinal mobilization, Scar mobilization, DME instructions, Cryotherapy, Moist heat, and Biofeedback  PLAN FOR NEXT SESSION: coordination of pelvic floor with core and hip strengthening   Darryle Navy, PT, DPT 10/13/2509:53 AM  [facility] [address], [address] [place], [place] [address] Phone # [phone] Fax [phone]

## 2024-10-18 ENCOUNTER — Other Ambulatory Visit (HOSPITAL_BASED_OUTPATIENT_CLINIC_OR_DEPARTMENT_OTHER): Payer: Self-pay | Admitting: Cardiovascular Disease

## 2024-10-20 ENCOUNTER — Ambulatory Visit: Admitting: Physical Therapy

## 2024-10-20 DIAGNOSIS — M6281 Muscle weakness (generalized): Secondary | ICD-10-CM | POA: Diagnosis not present

## 2024-10-20 DIAGNOSIS — R279 Unspecified lack of coordination: Secondary | ICD-10-CM

## 2024-10-20 DIAGNOSIS — R293 Abnormal posture: Secondary | ICD-10-CM

## 2024-10-20 NOTE — Therapy (Signed)
 OUTPATIENT PHYSICAL THERAPY FEMALE PELVIC TREATMENT   Patient Name: Barbara Munoz MRN: [id number] DOB:05-Mar-1951, 73 y.o., female Today's Date: 10/20/2024  END OF SESSION:  PT End of Session - 10/20/24 1017     Visit Number 7    Date for Recertification  10/28/24    Authorization Type [employer]    Authorization Time Period Cohere Approved 8 visits-07/28/24-10/28/24-auth#[id number]    Authorization - Visit Number 7    Authorization - Number of Visits 8    PT Start Time 1015    PT Stop Time 1053    PT Time Calculation (min) 38 min    Activity Tolerance Patient tolerated treatment well    Behavior During Therapy WFL for tasks assessed/performed          Past Medical History:  Diagnosis Date   Anxiety and depression 09/08/2011   Diverticulosis    12/2022 colonoscopy   Female bladder prolapse    GERD (gastroesophageal reflux disease) 09/05/2011   Improved signif with PPI   History of adenomatous polyp of colon 08/2017   History of Clostridium difficile infection 2007   colonic changes c/w with this dx seen on colonoscopy 2007---sx's responded to flagyl .   History of cyst of breast    multiple drained during menopause   Hyperlipidemia    Hypotension 2022   ?idiopathic   IFG (impaired fasting glucose) 06/2018   Fastings 105-115 range when pt checked with husband's glucometer.  Fasting gluc here was 98 and A1c 5.6% 07/01/18.   Insomnia 09/05/2011   Migraine    worst during menopause   Orthostatic hypotension 11/20/2021   Overweight (BMI 25.0-29.9) 09/05/2011   Palpitations    rare PACs and PVCs on monitoring 12/2023   Seasonal allergic rhinitis    Subclinical hypothyroidism    Vertigo, benign positional 09/08/2011   Past Surgical History:  Procedure Laterality Date   CARDIOVASCULAR STRESS TEST  02/15/2015   Exercise myocard perfusion testing: Normal EF, normal wall motion, normal perfusion.  Poor exercise capacity (4 min).   COLONOSCOPY  07/2006; 08/25/17   2007:  pseudomembranous colitis, o/w normal. (Dr. Teressa).  08/2017: adenomas.  12/2022 adenomas.   Coronary calcium  score     2024 ZERO   COSMETIC SURGERY     DEXA  06/19/09; 01/19/2019   Normal 2010, 2020, 2024   ESOPHAGOGASTRODUODENOSCOPY     01/02/23, normal   POSTERIOR REPAIR  2012   REFRACTIVE SURGERY  05/2022   TONSILLECTOMY  1982   TRANSTHORACIC ECHOCARDIOGRAM  01/06/2022   NORMAL   Transvaginal ultrasound  10/2016   Normal (done for LLQ pain by Dr. Criselda).   TUBAL LIGATION  1983   vaginal hernia repair  05/2011   rectocele repair   ZIO monitoring  12/2023   rare PACs and PVCs   Patient Active Problem List   Diagnosis Date Noted   Breast tenderness 05/17/2024   Urinary incontinence, mixed 02/16/2024   Vaginal atrophy 02/16/2024   History of pelvic surgery 02/16/2024   Nocturia 02/16/2024   Orthostatic hypotension 11/20/2021   Breast lump 11/07/2019   Maxillary sinusitis 08/08/2015   Interdigital neuroma 11/06/2014   Health maintenance examination 11/03/2013   Preventative health care 11/09/2012   Anxiety and depression 09/08/2011   Pelvic organ prolapse quantification stage 2 cystocele    GERD (gastroesophageal reflux disease) 09/05/2011   Overweight (BMI 25.0-29.9) 09/05/2011   Insomnia 09/05/2011   Hyperlipidemia    Migraine     PCP: Candise Aleene DEL, MD   REFERRING PROVIDER:  Guadlupe Lianne DASEN, MD   REFERRING DIAG: R15.9 (ICD-10-CM) - Incontinence of feces, unspecified fecal incontinence type R35.1 (ICD-10-CM) - Nocturia N39.46 (ICD-10-CM) - Urinary incontinence, mixed  THERAPY DIAG:  Muscle weakness (generalized)  Unspecified lack of coordination  Abnormal posture  Rationale for Evaluation and Treatment: Rehabilitation  ONSET DATE: 12 years  SUBJECTIVE:                                                                                                                                                                                           SUBJECTIVE  STATEMENT: Reports she has been doing well overall.    Does feel like gas is still happening but not as bad.    Fluid intake: water - 30oz; 30oz of coffees/teas/sodas  PAIN:  Are you having pain? No   PRECAUTIONS: None  RED FLAGS: None   WEIGHT BEARING RESTRICTIONS: No  FALLS:  Has patient fallen in last 6 months? No  OCCUPATION: retired  ACTIVITY LEVEL : low  PLOF: Independent  PATIENT GOALS: to get stronger overall, regular bowel movements, no leakage  PERTINENT HISTORY:  Depression, Diverticulosis, Female bladder prolapse, Clostridium difficile infection, rectocele repair, Vaginal atrophy, Orthostatic hypotension, 2 cystocele  Sexual abuse: No  BOWEL MOVEMENT: Pain with bowel movement: No Type of bowel movement:Type (Bristol Stool Scale) varies from 2-6 but right now usually 2-3, Frequency every 3rd day, and Strain not often Fully empty rectum: No Leakage: Yes: smearing when thinks she is passing gas sometimes mucus Pads: No Fiber supplement/laxative Yes attempted miralax   URINATION: Pain with urination: No Fully empty bladder: No Stream: Weak Urgency: Yes  Frequency: not quicker than every 2 hours during the day, does do just in case; 1x nightly  Leakage: Urge to void and Walking to the bathroom Pads: No  INTERCOURSE:  Ability to have vaginal penetration No  Pain with intercourse: none   PREGNANCY: Vaginal deliveries 2 Episiotomy Yes  C-section deliveries 0 Currently pregnant No  PROLAPSE: Pressure felt vaginally and did have rectocele repaired previously   OBJECTIVE:  Note: Objective measures were completed at Evaluation unless otherwise noted.  DIAGNOSTIC FINDINGS:    PATIENT SURVEYS:    PFIQ-7: 34  COGNITION: Overall cognitive status: Within functional limits for tasks assessed     SENSATION: Light touch: Appears intact  LUMBAR SPECIAL TESTS:  Single leg stance test: pelvic instability noted bil  FUNCTIONAL TESTS:   Functional squat limited in descent bu 25% and bil knee valgus   GAIT: WFL  POSTURE: rounded shoulders, forward head, and posterior pelvic tilt   LUMBARAROM/PROM:  A/PROM A/PROM  eval  Flexion Limited by 25%  Extension WFL  Right lateral flexion Limited by 25%  Left lateral flexion Limited by 25%  Right rotation WFL  Left rotation WFL   (Blank rows = not tested)  LOWER EXTREMITY ROM:  Bil hamstrings and adductors limited by 25%  LOWER EXTREMITY MMT:  Bil hips grossly 4/5 PALPATION:   General: no TTP but tight bil lumbar paraspinals and gluteals   Pelvic Alignment: WFL  Abdominal: no TTP but mild fascial restrictions in all quadrants                 External [facility] Exam: [facility]                             Internal Pelvic Floor: tightness on Rt superficial and deep layer with TTP  Patient confirms identification and approves PT to assess internal pelvic floor and treatment Yes No emotional/communication barriers or cognitive limitation. Patient is motivated to learn. Patient understands and agrees with treatment goals and plan. PT explains patient will be examined in standing, sitting, and lying down to see how their muscles and joints work. When they are ready, they will be asked to remove their underwear so PT can examine their perineum. The patient is also given the option of providing their own chaperone as one is not provided in our facility. The patient also has the right and is explained the right to defer or refuse any part of the evaluation or treatment including the internal exam. With the patient's consent, PT will use one gloved finger to gently assess the muscles of the pelvic floor, seeing how well it contracts and relaxes and if there is muscle symmetry. After, the patient will get dressed and PT and patient will discuss exam findings and plan of care. PT and patient discuss plan of care, schedule, attendance policy and HEP activities.  PELVIC MMT:   MMT eval   Vaginal 3/5, 9s, 4 reps  Internal Anal Sphincter   External Anal Sphincter   Puborectalis   Diastasis Recti   (Blank rows = not tested)        TONE: Increased at Rt side  PROLAPSE: Not seen ant/post in hooklying with coughs  TODAY'S TREATMENT:                                                                                                                              DATE:     10/06/24: Abdominal massage completed x5 and reviewed to attempt regularly for improved bowel movement consistency Also to attempt more water intake and or fiber with meals Reviewed HEP and discussed review  Pt wanted to review bowel recommendations to make sure she understood carry over for home and reports no questions at end of session  10/13/24: Educated on pelvic floor activations with transfers as she still sees leakage with this most often  Reviewed her exercises from home (outside of HEP) good techniques and no  leakage reported  Dumire's carry 20# (10 each hand) 1500' mild cues for core activation and posture 10# each hand for hurdle stepping 3 hurdles x5 each way - one toe catch on middle step but good balance righting with stepping strategy  10# OHP with alt march in place 2x10 each  10/20/24: Reviewed progress and goals  Pt had questions about DC, continuing exercising but all answered   PATIENT EDUCATION:  Education details: PSYCHOLOGIST, SPORT AND EXERCISE Person educated: Patient Education method: Explanation, Demonstration, Tactile cues, Verbal cues, and Handouts Education comprehension: verbalized understanding, returned demonstration, verbal cues required, tactile cues required, and needs further education  HOME EXERCISE PROGRAM: [id number]  ASSESSMENT:  CLINICAL IMPRESSION: Patient is a 73 y.o. female  who was seen today for physical therapy treatment for urine and fecal incontinence and increased urinary frequency. Pt has improved greatly reports at least 90% better since starting PT. Very pleased with  progress reports feeling I with DC today and has met all STG, all but one LTG. Pt is more active with less leakage and feels I with HEP and agreeable to DC today.   OBJECTIVE IMPAIRMENTS: decreased activity tolerance, decreased coordination, decreased endurance, decreased mobility, decreased strength, increased fascial restrictions, increased muscle spasms, impaired flexibility, improper body mechanics, postural dysfunction, and pain.   ACTIVITY LIMITATIONS: continence  PARTICIPATION LIMITATIONS: community activity  PERSONAL FACTORS: Time since onset of injury/illness/exacerbation and 1 comorbidity: medical history are also affecting patient's functional outcome.   REHAB POTENTIAL: Good  CLINICAL DECISION MAKING: Stable/uncomplicated  EVALUATION COMPLEXITY: Low   GOALS: Goals reviewed with patient? Yes  SHORT TERM GOALS: Target date: 08/25/24  Pt to be I with HEP for carry over and continuing recommendations for improved outcomes.   Baseline: Goal status: MET  2.  Pt will be independent with the knack, urge suppression technique, and double voiding in order to improve bladder habits and decrease urinary incontinence.   Baseline:  Goal status: MET  3.  Pt will be independent with use of squatty potty, relaxed toileting mechanics, and improved bowel movement techniques in order to increase ease of bowel movements and complete evacuation.   Baseline:  Goal status: MET  4.  Pt to be I with pressure management with lifting 5# without feeling pressure vaginally or rectally without compensatory strategies to decreased strain at pelvic floor.  Baseline:  Goal status: MET   LONG TERM GOALS: Target date: 10/28/24  Pt to be I with advanced HEP for carry over and continuing recommendations for improved outcomes.   Baseline:  Goal status: MET  2.  Pt to demonstrate improved coordination of pelvic floor and breathing mechanics with 10# squat with appropriate synergistic patterns to  decrease pain and leakage at least 75% of the time for improved ability to complete a 30 minute walk without strain at pelvic floor and symptoms.    Baseline:  Goal status: MET  3.  Pt to report no fecal leakage in at least 1 month due to improved pelvic floor strength and coordination for improved confidence with community outings.  Baseline:  Goal status: MET  4.  Pt to report no urinary leakage in at least 1 week for improved confidence with community outings.  Baseline:  Goal status: not met (on average about 3-4 days in a row without leakage but will have a few drops randomly but greatly less).    PLAN:  PT FREQUENCY: 1x/week  PT DURATION: 8 sessions  PLANNED INTERVENTIONS: 97110-Therapeutic exercises, 97530- Therapeutic activity, [id number]- Neuromuscular re-education, 97535- Self  Care, 02859- Manual therapy, [id number]- Canalith repositioning, [id number]- Aquatic Therapy, [id number] (1-2 muscles), 20561 (3+ muscles)- Dry Needling, Patient/Family education, Taping, Joint mobilization, Spinal mobilization, Scar mobilization, DME instructions, Cryotherapy, Moist heat, and Biofeedback  PLAN FOR NEXT SESSION:   PHYSICAL THERAPY DISCHARGE SUMMARY  Visits from Start of Care: 7  Current functional level related to goals / functional outcomes: All STG met, 3/4 LTG met does have occasional urinary incontinence (drops)   Remaining deficits: Intermittent urinary incontinence with very strong sneeze or heavy lifting in garden    Education / Equipment: HEP   Patient agrees to discharge. Patient goals were partially met. Patient is being discharged due to being pleased with the current functional level.   Darryle Navy, PT, DPT 10/20/2509:53 AM  [facility] [address] [place], [place] [address] Phone # [phone] Fax [phone]

## 2024-10-21 DIAGNOSIS — H25811 Combined forms of age-related cataract, right eye: Secondary | ICD-10-CM | POA: Diagnosis not present

## 2024-10-21 DIAGNOSIS — H5371 Glare sensitivity: Secondary | ICD-10-CM | POA: Diagnosis not present

## 2024-10-27 ENCOUNTER — Ambulatory Visit (INDEPENDENT_AMBULATORY_CARE_PROVIDER_SITE_OTHER): Admitting: Licensed Clinical Social Worker

## 2024-10-27 DIAGNOSIS — F32A Depression, unspecified: Secondary | ICD-10-CM | POA: Diagnosis not present

## 2024-10-27 DIAGNOSIS — F419 Anxiety disorder, unspecified: Secondary | ICD-10-CM

## 2024-10-29 NOTE — Progress Notes (Signed)
 [facility] Counselor/Therapist Progress Note  Patient ID: Barbara Munoz, MRN: [id number]    Date: 10/29/24  Time Spent: 0402  pm - 0457 pm : 55 Minutes  Treatment Type: Individual Therapy.  Reported Symptoms: Patient lost her spouse September 18th of 2024. She has a sister that she is close to has been in hospice for 2 weeks and now she has passed away. She has a history of depression anxiety. Patient reports that she has a habit of wearing herself thin and being retired she has more insight into her behavior. Patient reports that her mother and sister were both hospitalized in April at two different hospitals.    Mental Status Exam: Appearance:  Casual     Behavior: Appropriate  Motor: Normal  Speech/Language:  Clear and Coherent  Affect: Flat  Mood: depressed  Thought process: normal  Thought content:   WNL  Sensory/Perceptual disturbances:   WNL  Orientation: oriented to person, place, time/date, situation, day of week, month of year, and year  Attention: Good  Concentration: Good  Memory: WNL  Fund of knowledge:  Good  Insight:   Good  Judgment:  Good  Impulse Control: Good    Risk Assessment: Danger to Self:  No Self-injurious Behavior: No Danger to Others: No Duty to Warn:no Physical Aggression / Violence:No  Access to Firearms a concern: No  Gang Involvement:No    Subjective:    Barbara Munoz participated in person from office, located at [employer].  Barbara Munoz consented to treatment. Therapist participated in person from [place] office.    Eymi presented for her session in a positive mood. Nyiesha reports that she recently had cataract surgery and is doing well. She reports that she has the other eye appointment coming up soon. Meah reports that she recently celebrated her husbands year anniversary with her girls. She reports that they both came and took her to lunch. She states that after lunch she told them to go home as she would like to be  alone.  Modesta reports that she has been enjoying time with nature and quiet. She states that she finds comfort in the quiet and just being. She states that she has rarely sat still in her life but is finding more comfort in nature.  She reports that she is working on being able to relax.  Franceen reports a desire to continue towork on mindfulness and find ways to be mindful of the good in her life and enjoy being in the moment.   Clinician actively listened and provided support and feedback related to patient concerns. Clinician processed the importance of rest and and taking time to refuel emotionally and physically. Clinician processed ideas for mindfulness including:    Sensory Activities: Focus on your breath: Notice the rise and fall of your breath as you inhale and exhale.  Body scan: Slowly scan your body from head to toe, paying attention to sensations such as warmth, coolness, or tingling.  Listen to sounds: Close your eyes and listen to the sounds around you, such as birdsong, traffic, or your own heartbeat.  Smell something: Focus on the scent of a flower, candle, or other object.  Taste something: Pay attention to the flavors, textures, and smells of your food.  Movement Activities: Mindful walking: Take a slow walk, noticing the sensations of your feet on the ground and the sights and sounds around you.  Yoga or tai chi: Engage in gentle movements that focus on your body and breath.  Dance: Allow yourself to move freely and express yourself through dance.  Creative Activities: Journaling: Write down your thoughts and feelings without judgment.  Drawing or painting: Focus on the process of creating art, rather than the outcome.  Playing with a pet: Pay attention to the sensations of petting your animal and their interactions with you.  Other Activities: Mindful meditation: Sit in a comfortable position and focus on your breath or a mantra.  Spend time in nature: Observe the sights,  sounds, and smells of the natural world.  Engage in hobbies: Focus on activities that require your attention and concentration, such as reading, gardening, or cooking.  Tips: Start with short periods of mindfulness practice and gradually increase the duration as you become more comfortable. Find a quiet and comfortable space where you can focus. Accept any distractions that arise and gently bring your attention back to the present moment. Practice regularly for the best results.  Clinician provided patient with a list of the ideas for mindfulness.   Aftan was fully engaged in session discussion. She was pleasant and cooperative. Maelle was motivated for treatment as evidenced by her setting goals and working toward those goals. Cornie will continue to work on improving and implementing mindfulness skills into her daily life. Patient is to use CBT, mindfulness and coping skills to help manage decrease symptoms associated with their diagnosis. Treatment planning to be reviewed by 06/08/2025.   Interventions: Cognitive Behavioral Therapy, Dialectical Behavioral Therapy, Mindfulness Meditation, and Solution-Oriented/Positive Psychology   Diagnosis: Anxiety and Depression   Damien Junk MSW, LCSW/DATE 10/27/2024

## 2024-10-31 ENCOUNTER — Ambulatory Visit: Admitting: Family Medicine

## 2024-10-31 ENCOUNTER — Encounter: Payer: Self-pay | Admitting: Family Medicine

## 2024-10-31 VITALS — BP 124/68 | HR 78 | Temp 97.7°F | Wt 156.0 lb

## 2024-10-31 DIAGNOSIS — J0100 Acute maxillary sinusitis, unspecified: Secondary | ICD-10-CM

## 2024-10-31 DIAGNOSIS — R051 Acute cough: Secondary | ICD-10-CM

## 2024-10-31 MED ORDER — AMOXICILLIN 500 MG PO CAPS: 500.0000 mg | ORAL_CAPSULE | Freq: Three times a day (TID) | ORAL | 0 refills | Status: AC

## 2024-10-31 NOTE — Patient Instructions (Addendum)

## 2024-10-31 NOTE — Progress Notes (Signed)
 Barbara Munoz , [birth year], 73 y.o., female MRN: [id number] Patient Care Team    Relationship Specialty Notifications Start End  McGowen, Aleene DEL, MD PCP - General Family Medicine  03/21/13   Raford Riggs, MD PCP - Cardiology Cardiology  12/01/22   Darcel Pool, MD Consulting Physician Obstetrics and Gynecology  10/15/15   Teressa Toribio SQUIBB, MD (Inactive) Attending Physician Gastroenterology  10/15/15   Octavia Charlie Hamilton, MD Consulting Physician Ophthalmology  04/17/21   Cara Elida HERO, NP Nurse Practitioner Gastroenterology  11/21/22   Armbruster, Elspeth SQUIBB, MD Consulting Physician Gastroenterology  01/06/23     Chief Complaint  Patient presents with   Cough    1 week; sinus pressure/congestion, fatigue.  Pt has tried Mucinex .      Subjective: Barbara Munoz is a 73 y.o. Pt presents for an OV with complaints of new onset congestion and sinus pressure of greater than 1 week duration.  Associated symptoms include night sweats. Pt has tried Claritin, Flonase  and Mucinex  to ease their symptoms.  See ROS    09/01/2024    9:56 AM 02/29/2024   10:07 AM 08/31/2023    1:12 PM 06/24/2023    8:51 AM 02/26/2023    9:57 AM  Depression screen PHQ 2/9  Decreased Interest 0 0 1 0 1  Down, Depressed, Hopeless 1 1 0 1 1  PHQ - 2 Score 1 1 1 1 2   Altered sleeping 0 2 2  1   Tired, decreased energy 1 1 2  1   Change in appetite 3 1 2  1   Feeling bad or failure about yourself  1 0 0  1  Trouble concentrating 0 0 1  1  Moving slowly or fidgety/restless 2 1 0  1  Suicidal thoughts 0 0 0  0  PHQ-9 Score 8  6  8   8    Difficult doing work/chores Not difficult at all Not difficult at all Somewhat difficult  Somewhat difficult     Data saved with a previous flowsheet row definition    No Known Allergies Social History   Social History Narrative   Widow (08/2023)   Nonsmoker.   2 daughters live within an hour   Past Medical History:  Diagnosis Date   Anxiety and  depression 09/08/2011   Diverticulosis    12/2022 colonoscopy   Female bladder prolapse    GERD (gastroesophageal reflux disease) 09/05/2011   Improved signif with PPI   History of adenomatous polyp of colon 08/2017   History of Clostridium difficile infection 2007   colonic changes c/w with this dx seen on colonoscopy 2007---sx's responded to flagyl .   History of cyst of breast    multiple drained during menopause   Hyperlipidemia    Hypotension 2022   ?idiopathic   IFG (impaired fasting glucose) 06/2018   Fastings 105-115 range when pt checked with husband's glucometer.  Fasting gluc here was 98 and A1c 5.6% 07/01/18.   Insomnia 09/05/2011   Migraine    worst during menopause   Orthostatic hypotension 11/20/2021   Overweight (BMI 25.0-29.9) 09/05/2011   Palpitations    rare PACs and PVCs on monitoring 12/2023   Seasonal allergic rhinitis    Subclinical hypothyroidism    Vertigo, benign positional 09/08/2011   Past Surgical History:  Procedure Laterality Date   CARDIOVASCULAR STRESS TEST  02/15/2015   Exercise myocard perfusion testing: Normal EF, normal wall motion, normal perfusion.  Poor exercise capacity (4 min).  COLONOSCOPY  07/2006; 08/25/17   2007: pseudomembranous colitis, o/w normal. (Dr. Teressa).  08/2017: adenomas.  12/2022 adenomas.   Coronary calcium  score     2024 ZERO   COSMETIC SURGERY     DEXA  06/19/09; 01/19/2019   Normal 2010, 2020, 2024   ESOPHAGOGASTRODUODENOSCOPY     01/02/23, normal   POSTERIOR REPAIR  2012   REFRACTIVE SURGERY  05/2022   TONSILLECTOMY  1982   TRANSTHORACIC ECHOCARDIOGRAM  01/06/2022   NORMAL   Transvaginal ultrasound  10/2016   Normal (done for LLQ pain by Dr. Criselda).   TUBAL LIGATION  1983   vaginal hernia repair  05/2011   rectocele repair   ZIO monitoring  12/2023   rare PACs and PVCs   Family History  Problem Relation Age of Onset   Diabetes Mother 55       type 2   Atrial fibrillation Mother    Aneurysm Father         aortic   Cancer Father        prostate and lung/ smoker   Pernicious anemia Father    Atrial fibrillation Sister    Hyperlipidemia Sister    Hypertension Sister    Diabetes Sister        type 2   Other Sister        degenerative disc disease   Thyroid  disease Sister    Cancer Sister        breast  cancer s/p dbl mastectomy doing well   Hypertension Sister    Hyperlipidemia Sister    Diabetes Sister        type 2   Breast cancer Sister 66   Hyperlipidemia Brother    Allergies Brother    Breast cancer Maternal Grandmother 72   Heart attack Maternal Grandfather    Heart disease Paternal Grandmother        CHF   Pernicious anemia Paternal Grandmother    Kidney disease Paternal Grandfather    Stroke Paternal Grandfather    Diabetes Daughter        type 1   Allergies Daughter    Colon cancer Neg Hx    Esophageal cancer Neg Hx    Stomach cancer Neg Hx    Rectal cancer Neg Hx    Bladder Cancer Neg Hx    Uterine cancer Neg Hx    Allergies as of 10/31/2024   No Known Allergies      Medication List        Accurate as of October 31, 2024 11:55 AM. If you have any questions, ask your nurse or doctor.          ALPRAZolam  0.25 MG tablet Commonly known as: XANAX  Take 1 tablet (0.25 mg total) by mouth 2 (two) times daily as needed.   amoxicillin  500 MG capsule Commonly known as: AMOXIL  Take 1 capsule (500 mg total) by mouth 3 (three) times daily for 10 days. Started by: [name]   atorvastatin  20 MG tablet Commonly known as: LIPITOR Take 1 tablet (20 mg total) by mouth daily.   estradiol  0.1 MG/GM vaginal cream Commonly known as: ESTRACE  Place 0.5-1g nightly for two weeks then twice a week after   fludrocortisone  0.1 MG tablet Commonly known as: FLORINEF  TAKE 1 TABLET BY MOUTH EVERY DAY   fluticasone  50 MCG/ACT nasal spray Commonly known as: FLONASE  SPRAY 2 SPRAYS INTO EACH NOSTRIL EVERY DAY   loratadine 10 MG tablet Commonly known as:  CLARITIN Take 10 mg  by mouth daily as needed. Spring and fall   Melatonin 5 MG Caps Take 5 mg by mouth as needed.   meloxicam  7.5 MG tablet Commonly known as: MOBIC  TAKE 1 TO 2 TABLETS EVERY DAY AS NEEDED FOR MUSCULOSKELETAL PAIN   METRONIDAZOLE  (TOPICAL) 0.75 % Lotn Apply 1 Application topically daily.   midodrine  5 MG tablet Commonly known as: PROAMATINE  Take 1 tablet (5 mg total) by mouth 3 (three) times daily with meals.   pantoprazole  40 MG tablet Commonly known as: PROTONIX  Take 1 tablet (40 mg total) by mouth daily as needed.   sertraline  100 MG tablet Commonly known as: ZOLOFT  Take 2 tablets (200 mg total) by mouth at bedtime.   valACYclovir  1000 MG tablet Commonly known as: VALTREX  Take 1 tablet (1,000 mg total) by mouth daily.   Vibegron  75 MG Tabs Take 1 tablet (75 mg total) by mouth daily.        All past medical history, surgical history, allergies, family history, immunizations andmedications were updated in the EMR today and reviewed under the history and medication portions of their EMR.     Review of Systems  Constitutional:  Positive for diaphoresis and malaise/fatigue.  HENT:  Positive for congestion and sinus pain.   Eyes: Negative.   Respiratory:  Positive for cough and sputum production. Negative for shortness of breath and wheezing.   Cardiovascular: Negative.   Gastrointestinal:  Negative for nausea and vomiting.  Neurological:  Positive for headaches. Negative for dizziness.   Negative, with the exception of above mentioned in HPI   Objective:  BP 124/68   Pulse 78   Temp 97.7 F (36.5 C)   Wt 156 lb (70.8 kg)   SpO2 98%   BMI 26.78 kg/m  Body mass index is 26.78 kg/m. Physical Exam Vitals and nursing note reviewed.  Constitutional:      General: She is not in acute distress.    Appearance: Normal appearance. She is normal weight. She is not ill-appearing or toxic-appearing.  HENT:     Head: Normocephalic and atraumatic.      Right Ear: Tympanic membrane, ear canal and external ear normal. There is no impacted cerumen.     Left Ear: Tympanic membrane, ear canal and external ear normal. There is no impacted cerumen.     Nose: Congestion and rhinorrhea present.     Mouth/Throat:     Mouth: Mucous membranes are moist.     Pharynx: No oropharyngeal exudate or posterior oropharyngeal erythema.     Comments: Postnasal drip present Maxillary sinus pain present Eyes:     General: No scleral icterus.       Right eye: No discharge.        Left eye: No discharge.     Extraocular Movements: Extraocular movements intact.     Conjunctiva/sclera: Conjunctivae normal.     Pupils: Pupils are equal, round, and reactive to light.  Cardiovascular:     Rate and Rhythm: Normal rate and regular rhythm.  Pulmonary:     Effort: Pulmonary effort is normal. No respiratory distress.     Breath sounds: Rhonchi present. No wheezing or rales.  Musculoskeletal:     Cervical back: Neck supple.     Right lower leg: No edema.     Left lower leg: No edema.  Lymphadenopathy:     Cervical: Cervical adenopathy present.  Skin:    Findings: No rash.  Neurological:     Mental Status: She is alert and oriented to person,  place, and time. Mental status is at baseline.     Motor: No weakness.     Coordination: Coordination normal.     Gait: Gait normal.  Psychiatric:        Mood and Affect: Mood normal.        Behavior: Behavior normal.        Thought Content: Thought content normal.        Judgment: Judgment normal.     No results found. No results found. No results found for this or any previous visit (from the past 24 hours).  Assessment/Plan: Barbara Munoz is a 73 y.o. female present for OV for  Acute maxillary sinusitis: COVID test: Negative Rest, hydrate.  Restart flonase , mucinex  (DM if cough), nettie pot or nasal saline.  Amoxil  3 times daily prescribed, take until completed.  F/U 2 weeks if not improved.  Sooner if  needed  Reviewed expectations re: course of current medical issues. Discussed self-management of symptoms. Outlined signs and symptoms indicating need for more acute intervention. Patient verbalized understanding and all questions were answered. Patient received an After-Visit Summary.    Orders Placed This Encounter  Procedures   POC COVID-19 BinaxNow   Meds ordered this encounter  Medications   amoxicillin  (AMOXIL ) 500 MG capsule    Sig: Take 1 capsule (500 mg total) by mouth 3 (three) times daily for 10 days.    Dispense:  30 capsule    Refill:  0   Referral Orders  No referral(s) requested today     Note is dictated utilizing voice recognition software. Although note has been proof read prior to signing, occasional typographical errors still can be missed. If any questions arise, please do not hesitate to call for verification.   electronically signed by:  [name], DO  [facility] [place]

## 2024-11-08 ENCOUNTER — Other Ambulatory Visit: Payer: Self-pay | Admitting: Family Medicine

## 2024-11-23 ENCOUNTER — Ambulatory Visit: Admitting: Licensed Clinical Social Worker

## 2024-11-23 DIAGNOSIS — F419 Anxiety disorder, unspecified: Secondary | ICD-10-CM

## 2024-11-23 NOTE — Progress Notes (Signed)
 [facility] Counselor/Therapist Progress Note  Patient ID: Barbara Munoz, MRN: [id number]    Date: 11/23/24  Time Spent: 0903  am - 1000 am : 57 Minutes  Treatment Type: Individual Therapy.  Reported Symptoms: Patient lost her spouse September 18th of 2024. She has a sister that she is close to has been in hospice for 2 weeks and now she has passed away. She has a history of depression anxiety. Patient reports that she has a habit of wearing herself thin and being retired she has more insight into her behavior. Patient reports that her mother and sister were both hospitalized in April at two different hospitals.    Mental Status Exam: Appearance:  Casual     Behavior: Appropriate  Motor: Normal  Speech/Language:  Clear and Coherent  Affect: Flat  Mood: depressed  Thought process: normal  Thought content:   WNL  Sensory/Perceptual disturbances:   WNL  Orientation: oriented to person, place, time/date, situation, day of week, month of year, and year  Attention: Good  Concentration: Good  Memory: WNL  Fund of knowledge:  Good  Insight:   Good  Judgment:  Good  Impulse Control: Good    Risk Assessment: Danger to Self:  No Self-injurious Behavior: No Danger to Others: No Duty to Warn:no Physical Aggression / Violence:No  Access to Firearms a concern: No  Gang Involvement:No    Subjective:    Barbara Munoz participated in person from office, located at [employer].  Barbara Munoz consented to treatment. Therapist participated in person from [place] office.    Barbara Munoz presented for her session in a positive mood. Patient states she made it through Thanksgiving. She reports that she spent it with her Mother at her home with family. Patient states she is working on discovering things in her life. She reports that she called a tree service and she went outside and stayed outside with them picking up things. She reports that she and her husband worked on the yard and  were outside a lot. During that time she felt close to her husband as though he was there with her. Patient reports that her attention has been drawn to an elderly person moving along and needing help and compared that to her husband and his health decline. Patient reports that she is working through her emotions.   Clinician actively listened and provided support and feedback. Clinician processed with patient how her faith is her rock. Clinician and patient processed how being social and engaged in activities both in church and the community can improve emotions. Clinician processed the importance of rest and and taking time to refuel emotionally and physically. Clinician processed ideas for mindfulness including:    Sensory Activities: Focus on your breath: Notice the rise and fall of your breath as you inhale and exhale.  Body scan: Slowly scan your body from head to toe, paying attention to sensations such as warmth, coolness, or tingling.  Listen to sounds: Close your eyes and listen to the sounds around you, such as birdsong, traffic, or your own heartbeat.  Smell something: Focus on the scent of a flower, candle, or other object.  Taste something: Pay attention to the flavors, textures, and smells of your food.  Movement Activities: Mindful walking: Take a slow walk, noticing the sensations of your feet on the ground and the sights and sounds around you.  Yoga or tai chi: Engage in gentle movements that focus on your body and breath.  Dance: Allow yourself  to move freely and express yourself through dance.  Creative Activities: Journaling: Write down your thoughts and feelings without judgment.  Drawing or painting: Focus on the process of creating art, rather than the outcome.  Playing with a pet: Pay attention to the sensations of petting your animal and their interactions with you.  Other Activities: Mindful meditation: Sit in a comfortable position and focus on your breath or a mantra.   Spend time in nature: Observe the sights, sounds, and smells of the natural world.  Engage in hobbies: Focus on activities that require your attention and concentration, such as reading, gardening, or cooking.  Tips: Start with short periods of mindfulness practice and gradually increase the duration as you become more comfortable. Find a quiet and comfortable space where you can focus. Accept any distractions that arise and gently bring your attention back to the present moment. Practice regularly for the best results.  Clinician provided patient with a list of the ideas for mindfulness.   Barbara Munoz was fully engaged in session discussion. She was pleasant and cooperative. Barbara Munoz was motivated for treatment as evidenced by her setting goals and working toward those goals. Barbara Munoz will continue to work on improving and implementing mindfulness skills into her daily life. Patient is to use CBT, mindfulness and coping skills to help manage decrease symptoms associated with their diagnosis. Treatment planning to be reviewed by 06/08/2025.   Interventions: Cognitive Behavioral Therapy, Dialectical Behavioral Therapy, Mindfulness Meditation, and Solution-Oriented/Positive Psychology   Diagnosis: Anxiety and Depression     Damien Junk MSW, LCSW/DATE 11/23/2024

## 2024-12-28 ENCOUNTER — Ambulatory Visit: Admitting: Licensed Clinical Social Worker

## 2024-12-28 DIAGNOSIS — F32A Depression, unspecified: Secondary | ICD-10-CM

## 2024-12-28 DIAGNOSIS — F419 Anxiety disorder, unspecified: Secondary | ICD-10-CM

## 2024-12-28 NOTE — Progress Notes (Signed)
 [facility] Counselor/Therapist Progress Note  Patient ID: Barbara Munoz, MRN: [id number]    Date: 12/28/2024  Time Spent: 0100  pm - 0201 pm : 61 Minutes  Treatment Type: Individual Therapy.  Reported Symptoms: Patient lost her spouse September 18th of 2024. She has a sister that she is close to has been in hospice for 2 weeks and now she has passed away. She has a history of depression anxiety. Patient reports that she has a habit of wearing herself thin and being retired she has more insight into her behavior. Patient reports that her mother and sister were both hospitalized in April at two different hospitals.    Mental Status Exam: Appearance:  Casual     Behavior: Appropriate  Motor: Normal  Speech/Language:  Clear and Coherent  Affect: Flat  Mood: depressed  Thought process: normal  Thought content:   WNL  Sensory/Perceptual disturbances:   WNL  Orientation: oriented to person, place, time/date, situation, day of week, month of year, and year  Attention: Good  Concentration: Good  Memory: WNL  Fund of knowledge:  Good  Insight:   Good  Judgment:  Good  Impulse Control: Good    Risk Assessment: Danger to Self:  No Self-injurious Behavior: No Danger to Others: No Duty to Warn:no Physical Aggression / Violence:No  Access to Firearms a concern: No  Gang Involvement:No    Subjective:    Barbara Munoz participated in person from office, located at [employer].  Riyan consented to treatment. Therapist participated in person from [place] office.    Barbara Munoz presented for her session reporting that she had a good calm holiday with her family. She reports that while it was the second Christmas without her husband everything went well. Barbara Munoz reports that she has been dwelling on her uncomfortable feelings when in a small social group. She reports that she has been like this since her teens and wonders why she refrains from saying anything and prefers to stay  quiet not say or do anything to upset anyone. Patient reports that she does feel that she has somewhat good self esteem, but at times questions how she really feels about herself.  Clinician actively listened and processed with patient her concerns. Clinician processed with patient her concerns about being uncomfortable in social situations. Clinician identified and processed with patient feeling awkward in social situations often stems from fear (of judgment, rejection, not fitting in), lack of experience/practice, low self-esteem, perfectionism, or being in unfamiliar environments, causing anxiety and self-consciousness, but it's a common human experience, not a personal failing, and can be managed by understanding these roots and gradually building confidence through practice and observation.  Barbara Munoz was fully engaged in session discussion. She was pleasant and cooperative. Barbara Munoz was motivated for treatment as evidenced by her setting goals and working toward those goals. Barbara Munoz will continue to work on improving and implementing mindfulness skills into her daily life. Patient is to use CBT, mindfulness and coping skills to help manage decrease symptoms associated with their diagnosis. Treatment planning to be reviewed by 06/08/2025.   Interventions: Cognitive Behavioral Therapy, Dialectical Behavioral Therapy, Mindfulness Meditation, and Solution-Oriented/Positive Psychology   Diagnosis: Anxiety and Depression    Damien Junk MSW, LCSW/DATE 12/28/2024

## 2025-01-13 ENCOUNTER — Ambulatory Visit (HOSPITAL_BASED_OUTPATIENT_CLINIC_OR_DEPARTMENT_OTHER): Admitting: Cardiovascular Disease

## 2025-01-13 ENCOUNTER — Encounter (HOSPITAL_BASED_OUTPATIENT_CLINIC_OR_DEPARTMENT_OTHER): Payer: Self-pay | Admitting: Cardiovascular Disease

## 2025-01-13 VITALS — BP 122/68 | HR 85 | Ht 65.0 in | Wt 157.6 lb

## 2025-01-13 DIAGNOSIS — I951 Orthostatic hypotension: Secondary | ICD-10-CM

## 2025-01-13 DIAGNOSIS — R002 Palpitations: Secondary | ICD-10-CM | POA: Diagnosis not present

## 2025-01-13 DIAGNOSIS — E7800 Pure hypercholesterolemia, unspecified: Secondary | ICD-10-CM | POA: Diagnosis not present

## 2025-01-13 NOTE — Progress Notes (Signed)
 " Cardiology Office Note:  .   Date:  01/13/2025  ID:  Barbara Munoz, DOB [birth year], MRN [id number] PCP: Candise Aleene DEL, MD  [facility] Providers Cardiologist:  Annabella Scarce, MD    History of Present Illness: .    Barbara Munoz is a 74 y.o. female with a hx of hypotension, hyperlipidemia, GERD, anxiety, and obesity here today for follow up. She was first seen  10/10/2021 for orthostatic hypotension. She last saw Dr. McGowen 10/24/2021 and complained of continued orthostatic hypotension with associated fatigue. Her condition was improved with florinef  and midodrine . She was referred to cardiology. Cortisol and ACTH  were within normal limits.  She was doing well and Midodrine  was reduced to BID. She was struggling with stress and anxiety and we encouraged her to follow up with her therapist and continue with medication.   At her visit 11/2022 she was doing well.  She only had 1 episode of hypotension.  This improved with Pedialyte and rest.  Her blood pressures averaging in the 110s to 120s over 70s.  She was referred for a calcium  score which revealed a score of 12/2022.   At her visit 11/2023 she was doing well physically but struggling with stress after her husband passed.   She noted increased palpitations.  She wore a 14 day Zio monitor that revealed rare PACs and PVCs and 4   Discussed the use of AI scribe software for clinical note transcription with the patient, who gave verbal consent to proceed.  History of Present Illness Barbara Munoz is satisfied with her current blood pressure management, noting an improvement in her overall health compared to last year. She experiences episodes of hypotension, with the lowest recorded at 87/60 mmHg, though these episodes are less debilitating than when first diagnosed. She takes Florinef  and midodrine  once daily in the mornings and occasionally takes an extra dose when her blood pressure drops.  She started atorvastatin  in 09/16/25,  which has improved her cholesterol levels. She reports no significant issues with palpitations, experiencing only three or four instances over the past year, often during stressful times. No chest pain or shortness of breath. She reports three or four instances of palpitations over the past year, often during stressful times.  She denies significant swelling in her legs or feet, although she notes slight swelling, which she considers insignificant. She has been diagnosed with overactive bladder and has completed pelvic floor physical therapy. She has been journaling her fluid intake and examining her diet as part of managing her condition.  Her mother is recovering at home after hospitalization and celebrated her 100th birthday in 2025/09/16. Her sister passed away in Jun 16, 2025 after being hospitalized in April. She experienced significant stressors in April, including a memorial for her husband and family hospitalizations.  ROS:  As per HPI  Studies Reviewed: .       13 day Zio Monitor 12/2023:   Quality: Fair.  Baseline artifact. Predominant rhythm: sinus rhythm Average heart rate: 78 bpm Max heart rate: 137 bpm Min heart rate: 78 bpm Pauses >2.5 seconds: None   Rare (<1%) PACs and PVCs. 1 episode of SVT lasting 4 beats.  Risk Assessment/Calculations:             Physical Exam:   VS:  BP 122/68 (BP Location: Left Arm, Patient Position: Sitting, Cuff Size: Normal)   Pulse 85   Ht 5' 5 (1.651 m)   Wt 157 lb 9.6 oz (71.5 kg)   SpO2 98%  BMI 26.23 kg/m  , BMI Body mass index is 26.23 kg/m. GENERAL:  Well appearing HEENT: Pupils equal round and reactive, fundi not visualized, oral mucosa unremarkable NECK:  No jugular venous distention, waveform within normal limits, carotid upstroke brisk and symmetric, no bruits, no thyromegaly LUNGS:  Clear to auscultation bilaterally HEART:  RRR.  PMI not displaced or sustained,S1 and S2 within normal limits, no S3, no S4, no clicks, no rubs, no  murmurs ABD:  Flat, positive bowel sounds normal in frequency in pitch, no bruits, no rebound, no guarding, no midline pulsatile mass, no hepatomegaly, no splenomegaly EXT:  2 plus pulses throughout, no edema, no cyanosis no clubbing SKIN:  No rashes no nodules NEURO:  Cranial nerves II through XII grossly intact, motor grossly intact throughout PSYCH:  Cognitively intact, oriented to person place and time   ASSESSMENT AND PLAN: .    Assessment & Plan Orthostatic hypotension Episodes of low blood pressure managed with Florinef  and midodrine . Symptoms are not debilitating. - Continue Florinef  and midodrine  once daily in the morning. - Adjust medication as needed for low blood pressure episodes.  Pure hypercholesterolemia Cholesterol levels well-controlled with atorvastatin . Recent labs show improvement. - Continue atorvastatin  at current dose.  Palpitations Infrequent occurrences without significant symptoms. Normal EKG. - Monitor for alarming symptoms such as prolonged palpitations, lightheadedness, or dizziness.  Recording duration: 11 minutes           Dispo: f/u 1 year  Signed, Annabella Scarce, MD   "

## 2025-01-13 NOTE — Patient Instructions (Signed)
 Medication Instructions:  Your physician recommends that you continue on your current medications as directed. Please refer to the Current Medication list given to you today.   *If you need a refill on your cardiac medications before your next appointment, please call your pharmacy*  Lab Work: NONE  Testing/Procedures: NONE  Follow-Up: At [facility], you and your health needs are our priority.  As part of our continuing mission to provide you with exceptional heart care, our providers are all part of one team.  This team includes your primary Cardiologist (physician) and Advanced Practice Providers or APPs (Physician Assistants and Nurse Practitioners) who all work together to provide you with the care you need, when you need it.  Your next appointment:   12 month(s)  Provider:   Annabella Scarce, MD, Rosaline Bane, NP, or Reche Finder, NP    We recommend signing up for the patient portal called MyChart.  Sign up information is provided on this After Visit Summary.  MyChart is used to connect with patients for Virtual Visits (Telemedicine).  Patients are able to view lab/test results, encounter notes, upcoming appointments, etc.  Non-urgent messages can be sent to your provider as well.   To learn more about what you can do with MyChart, go to [email or web address].   Other Instructions

## 2025-01-16 ENCOUNTER — Ambulatory Visit: Admitting: Licensed Clinical Social Worker

## 2025-01-24 ENCOUNTER — Encounter (HOSPITAL_BASED_OUTPATIENT_CLINIC_OR_DEPARTMENT_OTHER): Admitting: Radiology

## 2025-01-24 DIAGNOSIS — Z1231 Encounter for screening mammogram for malignant neoplasm of breast: Secondary | ICD-10-CM

## 2025-01-26 ENCOUNTER — Other Ambulatory Visit: Payer: Self-pay | Admitting: Family Medicine

## 2025-01-26 DIAGNOSIS — Z1231 Encounter for screening mammogram for malignant neoplasm of breast: Secondary | ICD-10-CM

## 2025-02-03 ENCOUNTER — Encounter (HOSPITAL_BASED_OUTPATIENT_CLINIC_OR_DEPARTMENT_OTHER): Admitting: Radiology

## 2025-02-03 DIAGNOSIS — Z1231 Encounter for screening mammogram for malignant neoplasm of breast: Secondary | ICD-10-CM

## 2025-02-06 ENCOUNTER — Ambulatory Visit: Admitting: Licensed Clinical Social Worker

## 2025-02-09 ENCOUNTER — Ambulatory Visit

## 2025-02-15 ENCOUNTER — Ambulatory Visit

## 2025-03-01 ENCOUNTER — Encounter: Admitting: Family Medicine

## 2025-04-13 ENCOUNTER — Ambulatory Visit: Admitting: Dermatology
